# Patient Record
Sex: Male | Born: 1940
Health system: Southern US, Community
[De-identification: ages and names within clinical notes are randomized; demographics above are authoritative.]

## PROBLEM LIST (undated history)

## (undated) DIAGNOSIS — K6389 Other specified diseases of intestine: Secondary | ICD-10-CM

## (undated) DIAGNOSIS — M5410 Radiculopathy, site unspecified: Secondary | ICD-10-CM

## (undated) DIAGNOSIS — R972 Elevated prostate specific antigen [PSA]: Secondary | ICD-10-CM

## (undated) DIAGNOSIS — E782 Mixed hyperlipidemia: Secondary | ICD-10-CM

## (undated) DIAGNOSIS — I340 Nonrheumatic mitral (valve) insufficiency: Secondary | ICD-10-CM

## (undated) DIAGNOSIS — N529 Male erectile dysfunction, unspecified: Secondary | ICD-10-CM

## (undated) DIAGNOSIS — K5730 Diverticulosis of large intestine without perforation or abscess without bleeding: Secondary | ICD-10-CM

## (undated) DIAGNOSIS — R011 Cardiac murmur, unspecified: Secondary | ICD-10-CM

## (undated) DIAGNOSIS — N400 Enlarged prostate without lower urinary tract symptoms: Secondary | ICD-10-CM

## (undated) DIAGNOSIS — K449 Diaphragmatic hernia without obstruction or gangrene: Secondary | ICD-10-CM

## (undated) DIAGNOSIS — I350 Nonrheumatic aortic (valve) stenosis: Secondary | ICD-10-CM

## (undated) DIAGNOSIS — M109 Gout, unspecified: Secondary | ICD-10-CM

## (undated) DIAGNOSIS — I10 Essential (primary) hypertension: Secondary | ICD-10-CM

## (undated) DIAGNOSIS — I059 Rheumatic mitral valve disease, unspecified: Secondary | ICD-10-CM

## (undated) DIAGNOSIS — I517 Cardiomegaly: Secondary | ICD-10-CM

## (undated) DIAGNOSIS — E7800 Pure hypercholesterolemia, unspecified: Secondary | ICD-10-CM

## (undated) DIAGNOSIS — R195 Other fecal abnormalities: Secondary | ICD-10-CM

## (undated) DIAGNOSIS — I359 Nonrheumatic aortic valve disorder, unspecified: Secondary | ICD-10-CM

## (undated) DIAGNOSIS — I712 Thoracic aortic aneurysm, without rupture, unspecified: Secondary | ICD-10-CM

## (undated) DIAGNOSIS — G5620 Lesion of ulnar nerve, unspecified upper limb: Secondary | ICD-10-CM

## (undated) DIAGNOSIS — R030 Elevated blood-pressure reading, without diagnosis of hypertension: Secondary | ICD-10-CM

## (undated) DIAGNOSIS — D509 Iron deficiency anemia, unspecified: Secondary | ICD-10-CM

## (undated) DIAGNOSIS — M2550 Pain in unspecified joint: Secondary | ICD-10-CM

## (undated) DIAGNOSIS — G43109 Migraine with aura, not intractable, without status migrainosus: Secondary | ICD-10-CM

## (undated) HISTORY — DX: Diverticulosis of large intestine without perforation or abscess without bleeding: K57.30

## (undated) HISTORY — PX: HERNIA REPAIR: SHX51

## (undated) HISTORY — DX: Cardiomegaly: I51.7

## (undated) HISTORY — DX: Mixed hyperlipidemia: E78.2

## (undated) HISTORY — DX: Other fecal abnormalities: R19.5

## (undated) HISTORY — DX: Cardiac murmur, unspecified: R01.1

## (undated) HISTORY — DX: Essential (primary) hypertension: I10

## (undated) HISTORY — DX: Other specified diseases of intestine: K63.89

## (undated) HISTORY — DX: Lesion of ulnar nerve, unspecified upper limb: G56.20

## (undated) HISTORY — DX: Migraine with aura, not intractable, without status migrainosus: G43.109

## (undated) HISTORY — DX: Radiculopathy, site unspecified: M54.10

## (undated) HISTORY — DX: Elevated blood-pressure reading, without diagnosis of hypertension: R03.0

## (undated) HISTORY — DX: Benign prostatic hyperplasia without lower urinary tract symptoms: N40.0

## (undated) HISTORY — DX: Iron deficiency anemia, unspecified: D50.9

## (undated) HISTORY — DX: Thoracic aortic aneurysm, without rupture, unspecified: I71.20

## (undated) HISTORY — DX: Pain in unspecified joint: M25.50

## (undated) HISTORY — DX: Nonrheumatic aortic valve disorder, unspecified: I35.9

## (undated) HISTORY — DX: Elevated prostate specific antigen (PSA): R97.20

## (undated) HISTORY — DX: Gout, unspecified: M10.9

## (undated) HISTORY — DX: Nonrheumatic mitral (valve) insufficiency: I34.0

## (undated) HISTORY — DX: Pure hypercholesterolemia, unspecified: E78.00

## (undated) HISTORY — DX: Male erectile dysfunction, unspecified: N52.9

## (undated) HISTORY — DX: Diaphragmatic hernia without obstruction or gangrene: K44.9

## (undated) HISTORY — PX: TONSILLECTOMY AND ADENOIDECTOMY: SUR1326

---

## 2006-04-14 ENCOUNTER — Ambulatory Visit: Admission: RE | Admit: 2006-04-14 | Discharge: 2006-04-14 | Payer: Self-pay | Admitting: *Deleted

## 2010-07-27 HISTORY — PX: CATARACT EXTRACTION: SUR2

## 2012-07-07 DIAGNOSIS — H2510 Age-related nuclear cataract, unspecified eye: Secondary | ICD-10-CM | POA: Diagnosis not present

## 2012-07-27 HISTORY — PX: CATARACT EXTRACTION: SUR2

## 2012-08-03 DIAGNOSIS — H2510 Age-related nuclear cataract, unspecified eye: Secondary | ICD-10-CM | POA: Diagnosis not present

## 2012-08-08 DIAGNOSIS — H269 Unspecified cataract: Secondary | ICD-10-CM | POA: Diagnosis not present

## 2012-08-08 DIAGNOSIS — H2589 Other age-related cataract: Secondary | ICD-10-CM | POA: Diagnosis not present

## 2012-08-08 DIAGNOSIS — H2510 Age-related nuclear cataract, unspecified eye: Secondary | ICD-10-CM | POA: Diagnosis not present

## 2012-08-17 DIAGNOSIS — H2510 Age-related nuclear cataract, unspecified eye: Secondary | ICD-10-CM | POA: Diagnosis not present

## 2012-08-26 DIAGNOSIS — R030 Elevated blood-pressure reading, without diagnosis of hypertension: Secondary | ICD-10-CM | POA: Diagnosis not present

## 2012-08-26 DIAGNOSIS — M109 Gout, unspecified: Secondary | ICD-10-CM | POA: Diagnosis not present

## 2012-08-26 DIAGNOSIS — Z79899 Other long term (current) drug therapy: Secondary | ICD-10-CM | POA: Diagnosis not present

## 2012-09-01 DIAGNOSIS — D509 Iron deficiency anemia, unspecified: Secondary | ICD-10-CM | POA: Diagnosis not present

## 2012-09-01 DIAGNOSIS — I10 Essential (primary) hypertension: Secondary | ICD-10-CM | POA: Diagnosis not present

## 2012-09-01 DIAGNOSIS — M109 Gout, unspecified: Secondary | ICD-10-CM | POA: Diagnosis not present

## 2012-09-07 DIAGNOSIS — H269 Unspecified cataract: Secondary | ICD-10-CM | POA: Diagnosis not present

## 2012-09-07 DIAGNOSIS — H2510 Age-related nuclear cataract, unspecified eye: Secondary | ICD-10-CM | POA: Diagnosis not present

## 2012-09-07 DIAGNOSIS — H2589 Other age-related cataract: Secondary | ICD-10-CM | POA: Diagnosis not present

## 2012-09-21 DIAGNOSIS — Z1211 Encounter for screening for malignant neoplasm of colon: Secondary | ICD-10-CM | POA: Diagnosis not present

## 2012-09-29 DIAGNOSIS — I10 Essential (primary) hypertension: Secondary | ICD-10-CM | POA: Diagnosis not present

## 2012-09-29 DIAGNOSIS — D509 Iron deficiency anemia, unspecified: Secondary | ICD-10-CM | POA: Diagnosis not present

## 2012-09-29 DIAGNOSIS — Z79899 Other long term (current) drug therapy: Secondary | ICD-10-CM | POA: Diagnosis not present

## 2012-09-29 DIAGNOSIS — M109 Gout, unspecified: Secondary | ICD-10-CM | POA: Diagnosis not present

## 2012-10-07 DIAGNOSIS — D509 Iron deficiency anemia, unspecified: Secondary | ICD-10-CM | POA: Diagnosis not present

## 2012-10-07 DIAGNOSIS — R195 Other fecal abnormalities: Secondary | ICD-10-CM | POA: Diagnosis not present

## 2012-11-16 DIAGNOSIS — K319 Disease of stomach and duodenum, unspecified: Secondary | ICD-10-CM | POA: Diagnosis not present

## 2012-11-16 DIAGNOSIS — K5730 Diverticulosis of large intestine without perforation or abscess without bleeding: Secondary | ICD-10-CM | POA: Diagnosis not present

## 2012-11-16 DIAGNOSIS — K449 Diaphragmatic hernia without obstruction or gangrene: Secondary | ICD-10-CM | POA: Diagnosis not present

## 2012-11-16 DIAGNOSIS — D126 Benign neoplasm of colon, unspecified: Secondary | ICD-10-CM | POA: Diagnosis not present

## 2012-11-16 DIAGNOSIS — D509 Iron deficiency anemia, unspecified: Secondary | ICD-10-CM | POA: Diagnosis not present

## 2012-11-16 DIAGNOSIS — K2970 Gastritis, unspecified, without bleeding: Secondary | ICD-10-CM | POA: Diagnosis not present

## 2012-11-16 DIAGNOSIS — D490 Neoplasm of unspecified behavior of digestive system: Secondary | ICD-10-CM | POA: Diagnosis not present

## 2012-11-16 DIAGNOSIS — R195 Other fecal abnormalities: Secondary | ICD-10-CM | POA: Diagnosis not present

## 2012-11-28 ENCOUNTER — Ambulatory Visit (INDEPENDENT_AMBULATORY_CARE_PROVIDER_SITE_OTHER): Payer: Medicare Other | Admitting: Surgery

## 2012-11-28 ENCOUNTER — Encounter (INDEPENDENT_AMBULATORY_CARE_PROVIDER_SITE_OTHER): Payer: Self-pay | Admitting: Surgery

## 2012-11-28 VITALS — BP 176/84 | HR 96 | Temp 98.9°F | Resp 20 | Ht 70.0 in | Wt 181.6 lb

## 2012-11-28 DIAGNOSIS — D126 Benign neoplasm of colon, unspecified: Secondary | ICD-10-CM

## 2012-11-28 DIAGNOSIS — M109 Gout, unspecified: Secondary | ICD-10-CM

## 2012-11-28 DIAGNOSIS — D509 Iron deficiency anemia, unspecified: Secondary | ICD-10-CM | POA: Insufficient documentation

## 2012-11-28 DIAGNOSIS — K449 Diaphragmatic hernia without obstruction or gangrene: Secondary | ICD-10-CM

## 2012-11-28 DIAGNOSIS — K5730 Diverticulosis of large intestine without perforation or abscess without bleeding: Secondary | ICD-10-CM

## 2012-11-28 HISTORY — DX: Diverticulosis of large intestine without perforation or abscess without bleeding: K57.30

## 2012-11-28 HISTORY — DX: Diaphragmatic hernia without obstruction or gangrene: K44.9

## 2012-11-28 HISTORY — DX: Iron deficiency anemia, unspecified: D50.9

## 2012-11-28 MED ORDER — METRONIDAZOLE 500 MG PO TABS: 500.0000 mg | ORAL_TABLET | ORAL | Status: DC

## 2012-11-28 MED ORDER — DEXAMETHASONE SODIUM PHOSPHATE 4 MG/ML IJ SOLN: 4.0000 mg | INTRAMUSCULAR | Status: DC

## 2012-11-28 MED ORDER — NEOMYCIN SULFATE 500 MG PO TABS: 1000.0000 mg | ORAL_TABLET | ORAL | Status: DC

## 2012-11-28 NOTE — Patient Instructions (Addendum)
COLON PREP INSTRUCTIONS for lower/distal colectomy:   Obtain what you need at a pharmacy of your choice:      Prescriptions for your oral antibiotics (Neomycin & Metronidazole)     A bottle of Milk of Magnesia    2 Fleet enemas (generic form OK to use)   DAY PRIOR TO SURGERY:    1:00pm    o Take 2 oz (4 tablespoons) Milk of Magnesia. o Take 2 Neomycin 500mg  tablets & 2 Metronidazole 500mg  tablets     3:00pm:    o Take 2 Neomycin 500mg  tablets & 2 Metronidazole 500mg  tablets     Bedtime (~10:00pm)  o Take 2 Neomycin 500mg  tablets & 2 Metronidazole 500mg  tablets    Midnight:  Do not eat or drink anything after midnight the night before your surgery.   MORNING OF PROCEDURE:    Remember to not to drink or eat anything that morning    Upon waking up, take the 2 Fleet enemas.  o Use at least 1 hour before leaving house o Try to retain each enema for 5-10 minutes before expelling it.  This should clean your lower colon sufficiently   If you have questions or problems, please call [facility] [phone] speak to someone in the clinic department at our office    Colon Polyps Polyps are lumps of extra tissue growing inside the body. Polyps can grow in the large intestine (colon). Most colon polyps are noncancerous (benign). However, some colon polyps can become cancerous over time. Polyps that are larger than a pea may be harmful. To be safe, caregivers remove and test all polyps. CAUSES  Polyps form when mutations in the genes cause your cells to grow and divide even though no more tissue is needed. RISK FACTORS There are a number of risk factors that can increase your chances of getting colon polyps. They include:  Being older than 50 years.  Family history of colon polyps or colon cancer.  Long-term colon diseases, such as colitis or Crohn disease.  Being overweight.  Smoking.  Being inactive.  Drinking too much alcohol. SYMPTOMS  Most small  polyps do not cause symptoms. If symptoms are present, they may include:  Blood in the stool. The stool may look dark red or black.  Constipation or diarrhea that lasts longer than 1 week. DIAGNOSIS People often do not know they have polyps until their caregiver finds them during a regular checkup. Your caregiver can use 4 tests to check for polyps:  Digital rectal exam. The caregiver wears gloves and feels inside the rectum. This test would find polyps only in the rectum.  Barium enema. The caregiver puts a liquid called barium into your rectum before taking X-rays of your colon. Barium makes your colon look white. Polyps are dark, so they are easy to see in the X-ray pictures.  Sigmoidoscopy. A thin, flexible tube (sigmoidoscope) is placed into your rectum. The sigmoidoscope has a light and tiny camera in it. The caregiver uses the sigmoidoscope to look at the last third of your colon.  Colonoscopy. This test is like sigmoidoscopy, but the caregiver looks at the entire colon. This is the most common method for finding and removing polyps. TREATMENT  Any polyps will be removed during a sigmoidoscopy or colonoscopy. The polyps are then tested for cancer. PREVENTION  To help lower your risk of getting more colon polyps:  Eat plenty of fruits and vegetables. Avoid eating fatty foods.  Do not smoke.  Avoid drinking  alcohol.  Exercise every day.  Lose weight if recommended by your caregiver.  Eat plenty of calcium and folate. Foods that are rich in calcium include milk, cheese, and broccoli. Foods that are rich in folate include chickpeas, kidney beans, and spinach. HOME CARE INSTRUCTIONS Keep all follow-up appointments as directed by your caregiver. You may need periodic exams to check for polyps. SEEK MEDICAL CARE IF: You notice bleeding during a bowel movement. Document Released: 04/08/2004 Document Revised: 10/05/2011 Document Reviewed: 09/22/2011 [facility] Patient Information  2013 [place], [place].  ABDOMINAL SURGERY: POST OP INSTRUCTIONS  1. DIET: Follow a light bland diet the first 24 hours after arrival home, such as soup, liquids, crackers, etc.  Be sure to include lots of fluids daily.  Avoid fast food or heavy meals as your are more likely to get nauseated.  Eat a low fat the next few days after surgery.   2. Take your usually prescribed home medications unless otherwise directed. 3. PAIN CONTROL: a. Pain is best controlled by a usual combination of three different methods TOGETHER: i. Ice/Heat ii. Over the counter pain medication iii. Prescription pain medication b. Most patients will experience some swelling and bruising around the incisions.  Ice packs or heating pads (30-60 minutes up to 6 times a day) will help. Use ice for the first few days to help decrease swelling and bruising, then switch to heat to help relax tight/sore spots and speed recovery.  Some people prefer to use ice alone, heat alone, alternating between ice & heat.  Experiment to what works for you.  Swelling and bruising can take several weeks to resolve.   c. It is helpful to take an over-the-counter pain medication regularly for the first few weeks.  Choose one of the following that works best for you: i. Naproxen (Aleve, etc)  Two 220mg  tabs twice a day ii. Ibuprofen (Advil, etc) Three 200mg  tabs four times a day (every meal & bedtime) iii. Acetaminophen (Tylenol, etc) 500-650mg  four times a day (every meal & bedtime) d. A  prescription for pain medication (such as oxycodone, hydrocodone, etc) should be given to you upon discharge.  Take your pain medication as prescribed.  i. If you are having problems/concerns with the prescription medicine (does not control pain, nausea, vomiting, rash, itching, etc), please call us [phone] to see if we need to switch you to a different pain medicine that will work better for you and/or control your side effect better. ii. If you need a refill on  your pain medication, please contact your pharmacy.  They will contact our office to request authorization. Prescriptions will not be filled after 5 pm or on week-ends. 4. Avoid getting constipated.  Between the surgery and the pain medications, it is common to experience some constipation.  Increasing fluid intake and taking a fiber supplement (such as Metamucil, Citrucel, FiberCon, MiraLax, etc) 1-2 times a day regularly will usually help prevent this problem from occurring.  A mild laxative (prune juice, Milk of Magnesia, MiraLax, etc) should be taken according to package directions if there are no bowel movements after 48 hours.   5. Watch out for diarrhea.  If you have many loose bowel movements, simplify your diet to bland foods & liquids for a few days.  Stop any stool softeners and decrease your fiber supplement.  Switching to mild anti-diarrheal medications (Kayopectate, Pepto Bismol) can help.  If this worsens or does not improve, please call us. 6. Wash / shower every day.  You  may shower over the incision / wound.  Avoid baths until the skin is fully healed.  Continue to shower over incision(s) after the dressing is off. 7. Remove your waterproof bandages 5 days after surgery.  You may leave the incision open to air.  You may replace a dressing/Band-Aid to cover the incision for comfort if you wish. 8. ACTIVITIES as tolerated:   a. You may resume regular (light) daily activities beginning the next day-such as daily self-care, walking, climbing stairs-gradually increasing activities as tolerated.  If you can walk 30 minutes without difficulty, it is safe to try more intense activity such as jogging, treadmill, bicycling, low-impact aerobics, swimming, etc. b. Save the most intensive and strenuous activity for last such as sit-ups, heavy lifting, contact sports, etc  Refrain from any heavy lifting or straining until you are off narcotics for pain control.   c. DO NOT PUSH THROUGH PAIN.  Let pain be  your guide: If it hurts to do something, don't do it.  Pain is your body warning you to avoid that activity for another week until the pain goes down. d. You may drive when you are no longer taking prescription pain medication, you can comfortably wear a seatbelt, and you can safely maneuver your car and apply brakes. e. Bonita Quin may have sexual intercourse when it is comfortable.  9. FOLLOW UP in our office a. Please call [facility] at [phone] to set up an appointment to see your surgeon in the office for a follow-up appointment approximately 1-2 weeks after your surgery. b. Make sure that you call for this appointment the day you arrive home to insure a convenient appointment time. 10. IF YOU HAVE DISABILITY OR FAMILY LEAVE FORMS, BRING THEM TO THE OFFICE FOR PROCESSING.  DO NOT GIVE THEM TO YOUR DOCTOR.   WHEN TO CALL us [phone]: 1. Poor pain control 2. Reactions / problems with new medications (rash/itching, nausea, etc)  3. Fever over 101.5 F (38.5 C) 4. Inability to urinate 5. Nausea and/or vomiting 6. Worsening swelling or bruising 7. Continued bleeding from incision. 8. Increased pain, redness, or drainage from the incision  The clinic staff is available to answer your questions during regular business hours (8:30am-5pm).  Please don't hesitate to call and ask to speak to one of our nurses for clinical concerns.   A surgeon from [facility] is always on call at the hospitals   If you have a medical emergency, go to the nearest emergency room or call 911.    [facility] [address], [place], [place]  [address] ? MAIN: [phone] ? TOLL FREE: [phone] ? FAX [phone] [email or web address]

## 2012-11-28 NOTE — Progress Notes (Signed)
Subjective:     Patient ID: Drew Adams, male   DOB: 02/11/1941, 71 y.o.   MRN: [id number]  HPI  Drew Adams  03/14/1941 [id number]  Patient Care Team: Robert Gates, MD as PCP - General (Internal Medicine) John C Hayes, MD as Consulting Physician (Gastroenterology)  This patient is a 71 y.o.male who presents today for surgical evaluation at the request of Dr Hayes.   Reason for visit: 4 cm polyp in sigmoid colon.  Likely source of iron deficiency anemia.  Pleasant elderly gentleman.  Moderately severe gout.  On intermittent prednisone.  Takes a couple doses a week.  Used to walk more regularly but now is limited to about 15 minutes secondary to joint pain.  No cardiopulmonary issues.   No exertional chest/neck/shoulder/arm pain.   Has had worsening anemia over the past year or so.  Underwent screening colonoscopy for heme positive stools.  Found to have large polyp in sigmoid colon by Dr Hayes.  4 x 2 cm.  He did not feel it was safe to remove endoscopically.  He was concerned about possible early cancer.  Area not tattooed.  Surgical consultation requested.  Patient normally has a bowel movement every day.  No personal nor family history of GI/colon cancer, inflammatory bowel disease, irritable bowel syndrome, allergy such as Celiac Sprue, dietary/dairy problems, colitis, ulcers nor gastritis.  No recent sick contacts/gastroenteritis.  No travel outside the country.  No changes in diet.    Patient Active Problem List   Diagnosis Date Noted  . Adenomatous polyp of sigmoid colon 11/28/2012  . Iron deficiency anemia 11/28/2012  . Paraesophageal hiatal hernia 11/28/2012  . Diverticulosis of colon without hemorrhage 11/28/2012  . Gout     Past Medical History  Diagnosis Date  . Gout     uses prednisone 1-2x/week  . Iron deficiency anemia 11/28/2012  . Paraesophageal hiatal hernia 11/28/2012  . Diverticulosis of colon without hemorrhage 11/28/2012    Past Surgical History  Procedure  Laterality Date  . Hernia repair    . Tonsillectomy and adenoidectomy      History   Social History  . Marital Status: Married    Spouse Name: N/A    Number of Children: N/A  . Years of Education: N/A   Occupational History  . Not on file.   Social History Main Topics  . Smoking status: Never Smoker   . Smokeless tobacco: Never Used  . Alcohol Use: No  . Drug Use: No  . Sexually Active: Not on file   Other Topics Concern  . Not on file   Social History Narrative  . No narrative on file    Family History  Problem Relation Age of Onset  . Parkinson's disease Father   . Diabetes Father     Current Outpatient Prescriptions  Medication Sig Dispense Refill  . allopurinol (ZYLOPRIM) 300 MG tablet       . calcium citrate (CALCITRATE - DOSED IN MG ELEMENTAL CALCIUM) 950 MG tablet Take 1 tablet by mouth daily.      . celecoxib (CELEBREX) 200 MG capsule Take 200 mg by mouth 2 (two) times daily.      . Cholecalciferol (VITAMIN D-3) 5000 UNITS TABS Take by mouth.      . Cholecalciferol (VITAMIN D3) 10000 UNITS capsule Take 10,000 Units by mouth daily.      . COLCRYS 0.6 MG tablet       . glucosamine-chondroitin 500-400 MG tablet Take 1 tablet by mouth 3 (three) times   daily.      . magnesium oxide (MAG-OX) 400 MG tablet Take 400 mg by mouth daily.      . OVER THE COUNTER MEDICATION Trunature [facility]      . polyethylene glycol-electrolytes (NULYTELY/GOLYTELY) 420 G solution       . predniSONE (DELTASONE) 5 MG tablet       . metroNIDAZOLE (FLAGYL) 500 MG tablet Take 1 tablet (500 mg total) by mouth as directed. Take 2 pills (=1000mg) by mouth at 1pm, 3pm, and 10pm the day before your colorectal operation as discussed in [facility] office.  Call [phone] with questions  6 tablet  0  . neomycin (MYCIFRADIN) 500 MG tablet Take 2 tablets (1,000 mg total) by mouth as directed. Take 2 pills (=1000mg) by mouth at 1pm, 3pm, and 10pm the day before your colorectal operation as  discussed in [facility] office.  Call [phone] with questions  6 tablet  0   No current facility-administered medications for this visit.     No Known Allergies  BP 176/84  Pulse 96  Temp(Src) 98.9 F (37.2 C) (Oral)  Resp 20  Ht 5' 10" (1.778 m)  Wt 181 lb 9.6 oz (82.373 kg)  BMI 26.06 kg/m2  No results found.   Review of Systems  Constitutional: Negative for fever, chills, diaphoresis, activity change, fatigue and unexpected weight change.  HENT: Negative for nosebleeds, sore throat, facial swelling, mouth sores, trouble swallowing and ear discharge.   Eyes: Negative for photophobia, discharge and visual disturbance.  Respiratory: Negative for choking, chest tightness, shortness of breath and stridor.   Cardiovascular: Negative for chest pain and palpitations.  Gastrointestinal: Negative for nausea, vomiting, abdominal pain, diarrhea, constipation, abdominal distention, anal bleeding and rectal pain.  Endocrine: Negative for cold intolerance and heat intolerance.  Genitourinary: Negative for dysuria, urgency, difficulty urinating and testicular pain.  Musculoskeletal: Positive for joint swelling and arthralgias. Negative for myalgias, back pain and gait problem.  Skin: Negative for color change, pallor, rash and wound.  Allergic/Immunologic: Negative for environmental allergies and food allergies.  Neurological: Negative for dizziness, speech difficulty, weakness, numbness and headaches.  Hematological: Negative for adenopathy. Does not bruise/bleed easily.  Psychiatric/Behavioral: Negative for hallucinations, confusion and agitation.       Objective:   Physical Exam  Constitutional: He is oriented to person, place, and time. He appears well-developed and well-nourished. No distress.  HENT:  Head: Normocephalic.  Mouth/Throat: Oropharynx is clear and moist. No oropharyngeal exudate.  Eyes: Conjunctivae and EOM are normal. Pupils are equal, round, and reactive to light. No  scleral icterus.  Neck: Normal range of motion. Neck supple. No tracheal deviation present.  Cardiovascular: Normal rate, regular rhythm and intact distal pulses.   Pulmonary/Chest: Effort normal and breath sounds normal. No respiratory distress.  Abdominal: Soft. He exhibits no distension. There is no tenderness. There is no rigidity, no guarding, no CVA tenderness, no tenderness at McBurney's point and negative Murphy's sign. No hernia. Hernia confirmed negative in the right inguinal area and confirmed negative in the left inguinal area.    Obese but soft.  Genitourinary:  ? small impulse in left groin but no definite inguinal hernia   Musculoskeletal: Normal range of motion. He exhibits no tenderness.  Lymphadenopathy:    He has no cervical adenopathy.       Right: No inguinal adenopathy present.       Left: No inguinal adenopathy present.  Neurological: He is alert and oriented to person, place, and time.   No cranial nerve deficit. He exhibits normal muscle tone. Coordination normal.  Skin: Skin is warm and dry. No rash noted. He is not diaphoretic. No erythema. No pallor.  Psychiatric: He has a normal mood and affect. His behavior is normal. Judgment and thought content normal.       Assessment:     4 cm polyp in sigmoid colon, unresectable by endoscopy.     Plan:     Given the fact is the source of anemia of moderate size and not removable endoscopically, I recommended segmental resection of the colon containing the mass, Probable sigmoidectomy.  Reasonable laparoscopic candidate.Will try to make sure that it is not in the rectum by rigid proctoscopy on the table.  We will hold off on tattooing if possible since of moderate size.  The anatomy & physiology of the digestive tract was discussed.  The pathophysiology was discussed.  Natural history risks without surgery was discussed.   I feel the risks of no intervention will lead to serious problems that outweigh the operative risks;  therefore, I recommended a partial colectomy to remove the pathology.  Laparoscopic & open techniques were discussed.   Risks such as bleeding, infection, abscess, leak, reoperation, possible ostomy, hernia, heart attack, death, and other risks were discussed.  I noted a good likelihood this will help address the problem.   Goals of post-operative recovery were discussed as well.  We will work to minimize complications.  An educational handout on the pathology was given as well.  Questions were answered.  The patient expresses understanding & wishes to proceed with surgery.       

## 2012-12-02 DIAGNOSIS — K6389 Other specified diseases of intestine: Secondary | ICD-10-CM | POA: Diagnosis not present

## 2012-12-02 DIAGNOSIS — M109 Gout, unspecified: Secondary | ICD-10-CM | POA: Diagnosis not present

## 2012-12-02 DIAGNOSIS — I10 Essential (primary) hypertension: Secondary | ICD-10-CM | POA: Diagnosis not present

## 2012-12-02 DIAGNOSIS — D509 Iron deficiency anemia, unspecified: Secondary | ICD-10-CM | POA: Diagnosis not present

## 2012-12-13 ENCOUNTER — Encounter (INDEPENDENT_AMBULATORY_CARE_PROVIDER_SITE_OTHER): Payer: Self-pay

## 2012-12-13 ENCOUNTER — Encounter (HOSPITAL_COMMUNITY): Payer: Self-pay | Admitting: Pharmacy Technician

## 2012-12-20 DIAGNOSIS — M109 Gout, unspecified: Secondary | ICD-10-CM | POA: Diagnosis not present

## 2012-12-20 DIAGNOSIS — K6389 Other specified diseases of intestine: Secondary | ICD-10-CM | POA: Diagnosis not present

## 2012-12-20 DIAGNOSIS — I10 Essential (primary) hypertension: Secondary | ICD-10-CM | POA: Diagnosis not present

## 2012-12-20 DIAGNOSIS — D509 Iron deficiency anemia, unspecified: Secondary | ICD-10-CM | POA: Diagnosis not present

## 2012-12-20 NOTE — Patient Instructions (Addendum)
Drew Adams  12/20/2012   Your procedure is scheduled on:  12/27/12   Report to [facility] at   0500  AM.  Call this number if you have problems the morning of surgery: [phone]   Remember:   Do not eat food or drink liquids after midnight.   Take these medicines the morning of surgery with A SIP OF WATER:    Do not wear jewelry,   Do not wear lotions, powders, or perfumes.    Men may shave face and neck.  Do not bring valuables to the hospital.  Contacts, dentures or bridgework may not be worn into surgery.  Leave suitcase in the car. After surgery it may be brought to your room.  For patients admitted to the hospital, checkout time is 11:00 AM the day of  discharge.    SEE CHG INSTRUCTION SHEET    Please read over the following fact sheets that you were given: MRSA Information, coughing and deep breathing exercises, leg exercises, Blood Transfusion Fact Sheet                Failure to comply with these instructions may result in cancellation of your surgery.                Patient Signature ____________________________              Nurse Signature _____________________________

## 2012-12-21 ENCOUNTER — Encounter (HOSPITAL_COMMUNITY): Payer: Self-pay

## 2012-12-21 ENCOUNTER — Encounter (HOSPITAL_COMMUNITY)
Admission: RE | Admit: 2012-12-21 | Discharge: 2012-12-21 | Disposition: A | Discharge status: Home / Self Care | Payer: Medicare Other | Source: Ambulatory Visit | Attending: Surgery | Admitting: Surgery

## 2012-12-21 ENCOUNTER — Other Ambulatory Visit: Payer: Self-pay

## 2012-12-21 DIAGNOSIS — N182 Chronic kidney disease, stage 2 (mild): Secondary | ICD-10-CM | POA: Diagnosis not present

## 2012-12-21 DIAGNOSIS — M109 Gout, unspecified: Secondary | ICD-10-CM | POA: Diagnosis present

## 2012-12-21 DIAGNOSIS — D126 Benign neoplasm of colon, unspecified: Secondary | ICD-10-CM | POA: Diagnosis not present

## 2012-12-21 DIAGNOSIS — D649 Anemia, unspecified: Secondary | ICD-10-CM | POA: Diagnosis not present

## 2012-12-21 DIAGNOSIS — D509 Iron deficiency anemia, unspecified: Secondary | ICD-10-CM | POA: Diagnosis not present

## 2012-12-21 DIAGNOSIS — Z01812 Encounter for preprocedural laboratory examination: Secondary | ICD-10-CM | POA: Diagnosis not present

## 2012-12-21 DIAGNOSIS — K5730 Diverticulosis of large intestine without perforation or abscess without bleeding: Secondary | ICD-10-CM | POA: Diagnosis not present

## 2012-12-21 LAB — BASIC METABOLIC PANEL
CO2: 28 mEq/L (ref 19–32)
Chloride: 102 mEq/L (ref 96–112)
Glucose, Bld: 94 mg/dL (ref 70–99)
Sodium: 141 mEq/L (ref 135–145)

## 2012-12-21 LAB — CBC
Hemoglobin: 12.4 g/dL — ABNORMAL LOW (ref 13.0–17.0)
MCV: 80.3 fL (ref 78.0–100.0)
Platelets: 145 10*3/uL — ABNORMAL LOW (ref 150–400)
RBC: 4.97 MIL/uL (ref 4.22–5.81)
WBC: 10.7 10*3/uL — ABNORMAL HIGH (ref 4.0–10.5)

## 2012-12-21 LAB — ABO/RH: ABO/RH(D): A POS

## 2012-12-21 NOTE — Progress Notes (Signed)
Office visit note related to elevated blood pressure on 12/20/12 on chart from PCP.

## 2012-12-21 NOTE — Progress Notes (Signed)
CBC and BMP results faxed via EPIC to Dr Michaell Cowing.

## 2012-12-26 MED ORDER — SODIUM CHLORIDE 0.9 % IV SOLN
INTRAVENOUS | Status: DC
  Filled 2012-12-26: qty 6

## 2012-12-27 ENCOUNTER — Inpatient Hospital Stay (HOSPITAL_COMMUNITY): Payer: Medicare Other | Admitting: Registered Nurse

## 2012-12-27 ENCOUNTER — Encounter (HOSPITAL_COMMUNITY)
Admission: RE | Disposition: A | Discharge status: Home / Self Care | Payer: Self-pay | Source: Ambulatory Visit | Attending: Surgery

## 2012-12-27 ENCOUNTER — Encounter (HOSPITAL_COMMUNITY): Payer: Self-pay | Admitting: Registered Nurse

## 2012-12-27 ENCOUNTER — Encounter (HOSPITAL_COMMUNITY): Payer: Self-pay | Admitting: *Deleted

## 2012-12-27 ENCOUNTER — Inpatient Hospital Stay (HOSPITAL_COMMUNITY)
Admission: RE | Admit: 2012-12-27 | Discharge: 2012-12-30 | DRG: 331 | Disposition: A | Discharge status: Home / Self Care | Payer: Medicare Other | Source: Ambulatory Visit | Attending: Surgery | Admitting: Surgery

## 2012-12-27 DIAGNOSIS — D126 Benign neoplasm of colon, unspecified: Secondary | ICD-10-CM

## 2012-12-27 DIAGNOSIS — D509 Iron deficiency anemia, unspecified: Secondary | ICD-10-CM | POA: Diagnosis present

## 2012-12-27 DIAGNOSIS — N182 Chronic kidney disease, stage 2 (mild): Secondary | ICD-10-CM

## 2012-12-27 DIAGNOSIS — M109 Gout, unspecified: Secondary | ICD-10-CM | POA: Diagnosis present

## 2012-12-27 DIAGNOSIS — Z01812 Encounter for preprocedural laboratory examination: Secondary | ICD-10-CM

## 2012-12-27 DIAGNOSIS — K5730 Diverticulosis of large intestine without perforation or abscess without bleeding: Secondary | ICD-10-CM | POA: Diagnosis present

## 2012-12-27 HISTORY — PX: LAPAROSCOPIC SIGMOID COLECTOMY: SHX5928

## 2012-12-27 HISTORY — PX: LAPAROSCOPIC PARTIAL COLECTOMY: SHX5907

## 2012-12-27 LAB — TYPE AND SCREEN
ABO/RH(D): A POS
Antibody Screen: NEGATIVE

## 2012-12-27 SURGERY — LAPAROSCOPIC PARTIAL COLECTOMY
Anesthesia: General | Site: Abdomen | Wound class: Contaminated

## 2012-12-27 MED ORDER — HEPARIN SODIUM (PORCINE) 5000 UNIT/ML IJ SOLN
5000.0000 [IU] | Freq: Three times a day (TID) | INTRAMUSCULAR | Status: DC
  Administered 2012-12-28 – 2012-12-30 (×7): 5000 [IU] via SUBCUTANEOUS
  Filled 2012-12-27 (×10): qty 1

## 2012-12-27 MED ORDER — ONDANSETRON HCL 4 MG/2ML IJ SOLN
INTRAMUSCULAR | Status: DC | PRN
  Administered 2012-12-27: 4 mg via INTRAVENOUS

## 2012-12-27 MED ORDER — OXYCODONE HCL 5 MG PO TABS: 5.0000 mg | ORAL_TABLET | ORAL | Status: DC | PRN

## 2012-12-27 MED ORDER — SACCHAROMYCES BOULARDII 250 MG PO CAPS
250.0000 mg | ORAL_CAPSULE | Freq: Two times a day (BID) | ORAL | Status: DC
  Administered 2012-12-27 – 2012-12-30 (×7): 250 mg via ORAL
  Filled 2012-12-27 (×8): qty 1

## 2012-12-27 MED ORDER — DIPHENHYDRAMINE HCL 50 MG/ML IJ SOLN: 12.5000 mg | Freq: Four times a day (QID) | INTRAMUSCULAR | Status: DC | PRN

## 2012-12-27 MED ORDER — ALLOPURINOL 300 MG PO TABS
300.0000 mg | ORAL_TABLET | Freq: Every morning | ORAL | Status: DC
  Administered 2012-12-27 – 2012-12-30 (×4): 300 mg via ORAL
  Filled 2012-12-27 (×4): qty 1

## 2012-12-27 MED ORDER — PHENYLEPHRINE HCL 10 MG/ML IJ SOLN
INTRAMUSCULAR | Status: DC | PRN
  Administered 2012-12-27: 120 ug via INTRAVENOUS
  Administered 2012-12-27: 80 ug via INTRAVENOUS
  Administered 2012-12-27: 120 ug via INTRAVENOUS

## 2012-12-27 MED ORDER — SODIUM CHLORIDE 0.9 % IV SOLN: 250.0000 mL | INTRAVENOUS | Status: DC | PRN

## 2012-12-27 MED ORDER — HEPARIN SODIUM (PORCINE) 5000 UNIT/ML IJ SOLN
5000.0000 [IU] | Freq: Once | INTRAMUSCULAR | Status: AC
  Administered 2012-12-27: 5000 [IU] via SUBCUTANEOUS
  Filled 2012-12-27: qty 1

## 2012-12-27 MED ORDER — DEXTROSE 5 % IV SOLN
2.0000 g | Freq: Two times a day (BID) | INTRAVENOUS | Status: AC
  Administered 2012-12-27: 2 g via INTRAVENOUS
  Filled 2012-12-27: qty 2

## 2012-12-27 MED ORDER — SODIUM CHLORIDE 0.9 % IJ SOLN
INTRAMUSCULAR | Status: DC | PRN
  Administered 2012-12-27: 08:00:00

## 2012-12-27 MED ORDER — SODIUM CHLORIDE 0.9 % IJ SOLN: 3.0000 mL | INTRAMUSCULAR | Status: DC | PRN

## 2012-12-27 MED ORDER — ROCURONIUM BROMIDE 100 MG/10ML IV SOLN
INTRAVENOUS | Status: DC | PRN
  Administered 2012-12-27 (×2): 10 mg via INTRAVENOUS
  Administered 2012-12-27: 50 mg via INTRAVENOUS

## 2012-12-27 MED ORDER — STERILE WATER FOR IRRIGATION IR SOLN
Status: DC | PRN
  Administered 2012-12-27: 10:00:00 via INTRAVESICAL

## 2012-12-27 MED ORDER — LACTATED RINGERS IV BOLUS (SEPSIS): 1000.0000 mL | Freq: Three times a day (TID) | INTRAVENOUS | Status: AC | PRN

## 2012-12-27 MED ORDER — ALVIMOPAN 12 MG PO CAPS
12.0000 mg | ORAL_CAPSULE | Freq: Two times a day (BID) | ORAL | Status: DC
  Administered 2012-12-28 (×2): 12 mg via ORAL
  Filled 2012-12-27 (×4): qty 1

## 2012-12-27 MED ORDER — LIDOCAINE HCL (CARDIAC) 20 MG/ML IV SOLN
INTRAVENOUS | Status: DC | PRN
  Administered 2012-12-27: 100 mg via INTRAVENOUS

## 2012-12-27 MED ORDER — MIDAZOLAM HCL 5 MG/5ML IJ SOLN
INTRAMUSCULAR | Status: DC | PRN
  Administered 2012-12-27: 2 mg via INTRAVENOUS

## 2012-12-27 MED ORDER — PREDNISONE 1 MG PO TABS
1.0000 mg | ORAL_TABLET | Freq: Two times a day (BID) | ORAL | Status: DC
  Filled 2012-12-27 (×2): qty 1

## 2012-12-27 MED ORDER — PNEUMOCOCCAL VAC POLYVALENT 25 MCG/0.5ML IJ INJ
0.5000 mL | INJECTION | INTRAMUSCULAR | Status: AC
  Filled 2012-12-27 (×3): qty 0.5

## 2012-12-27 MED ORDER — PROMETHAZINE HCL 25 MG/ML IJ SOLN: 6.2500 mg | INTRAMUSCULAR | Status: DC | PRN

## 2012-12-27 MED ORDER — KCL IN DEXTROSE-NACL 40-5-0.9 MEQ/L-%-% IV SOLN
INTRAVENOUS | Status: DC
  Administered 2012-12-27: 14:00:00 via INTRAVENOUS
  Filled 2012-12-27 (×2): qty 1000

## 2012-12-27 MED ORDER — NEOSTIGMINE METHYLSULFATE 1 MG/ML IJ SOLN
INTRAMUSCULAR | Status: DC | PRN
  Administered 2012-12-27: 4 mg via INTRAVENOUS

## 2012-12-27 MED ORDER — ACETAMINOPHEN 500 MG PO TABS
1000.0000 mg | ORAL_TABLET | Freq: Three times a day (TID) | ORAL | Status: DC
  Administered 2012-12-27 – 2012-12-30 (×9): 1000 mg via ORAL
  Filled 2012-12-27 (×11): qty 2

## 2012-12-27 MED ORDER — DEXAMETHASONE SODIUM PHOSPHATE 4 MG/ML IJ SOLN
4.0000 mg | INTRAMUSCULAR | Status: AC
  Administered 2012-12-27: 8 mg via INTRAVENOUS
  Filled 2012-12-27: qty 1

## 2012-12-27 MED ORDER — HYDROMORPHONE HCL PF 1 MG/ML IJ SOLN
0.5000 mg | INTRAMUSCULAR | Status: DC | PRN
  Administered 2012-12-27 (×2): 2 mg via INTRAVENOUS
  Administered 2012-12-27 – 2012-12-29 (×4): 1 mg via INTRAVENOUS
  Filled 2012-12-27 (×3): qty 2
  Filled 2012-12-27 (×3): qty 1

## 2012-12-27 MED ORDER — LIP MEDEX EX OINT
1.0000 "application " | TOPICAL_OINTMENT | Freq: Two times a day (BID) | CUTANEOUS | Status: DC
  Administered 2012-12-27 – 2012-12-30 (×5): 1 via TOPICAL
  Filled 2012-12-27 (×2): qty 7

## 2012-12-27 MED ORDER — LACTATED RINGERS IV SOLN
INTRAVENOUS | Status: DC | PRN
  Administered 2012-12-27 (×4): via INTRAVENOUS

## 2012-12-27 MED ORDER — MAGIC MOUTHWASH
15.0000 mL | Freq: Four times a day (QID) | ORAL | Status: DC | PRN
  Filled 2012-12-27: qty 15

## 2012-12-27 MED ORDER — SUCCINYLCHOLINE CHLORIDE 20 MG/ML IJ SOLN
INTRAMUSCULAR | Status: DC | PRN
  Administered 2012-12-27: 100 mg via INTRAVENOUS

## 2012-12-27 MED ORDER — PROMETHAZINE HCL 25 MG/ML IJ SOLN: 6.2500 mg | Freq: Four times a day (QID) | INTRAMUSCULAR | Status: DC | PRN

## 2012-12-27 MED ORDER — ZINC SULFATE 220 (50 ZN) MG PO CAPS
220.0000 mg | ORAL_CAPSULE | Freq: Every day | ORAL | Status: DC
  Administered 2012-12-27 – 2012-12-30 (×4): 220 mg via ORAL
  Filled 2012-12-27 (×4): qty 1

## 2012-12-27 MED ORDER — BUPIVACAINE 0.25 % ON-Q PUMP DUAL CATH 300 ML
300.0000 mL | INJECTION | Status: DC
  Filled 2012-12-27: qty 300

## 2012-12-27 MED ORDER — ZINC GLUCONATE 50 MG PO TABS: 30.0000 mg | ORAL_TABLET | Freq: Every day | ORAL | Status: DC

## 2012-12-27 MED ORDER — ADULT MULTIVITAMIN W/MINERALS CH
1.0000 | ORAL_TABLET | Freq: Every day | ORAL | Status: DC
  Administered 2012-12-27 – 2012-12-30 (×4): 1 via ORAL
  Filled 2012-12-27 (×4): qty 1

## 2012-12-27 MED ORDER — PROPOFOL 10 MG/ML IV BOLUS
INTRAVENOUS | Status: DC | PRN
  Administered 2012-12-27: 40 mg via INTRAVENOUS
  Administered 2012-12-27: 150 mg via INTRAVENOUS

## 2012-12-27 MED ORDER — PREDNISONE 5 MG PO TABS
5.0000 mg | ORAL_TABLET | Freq: Every day | ORAL | Status: DC | PRN
  Filled 2012-12-27: qty 1

## 2012-12-27 MED ORDER — LACTATED RINGERS IR SOLN
Status: DC | PRN
  Administered 2012-12-27: 1000 mL

## 2012-12-27 MED ORDER — BUPIVACAINE-EPINEPHRINE PF 0.25-1:200000 % IJ SOLN
INTRAMUSCULAR | Status: DC | PRN
  Administered 2012-12-27: 50 mL

## 2012-12-27 MED ORDER — BUPIVACAINE 0.25 % ON-Q PUMP DUAL CATH 300 ML
INJECTION | Status: DC | PRN
  Administered 2012-12-27: 300 mL

## 2012-12-27 MED ORDER — INDIGOTINDISULFONATE SODIUM 8 MG/ML IJ SOLN
INTRAMUSCULAR | Status: DC | PRN
  Administered 2012-12-27: 5 mL via INTRAVENOUS

## 2012-12-27 MED ORDER — PREDNISONE 1 MG PO TABS
2.0000 mg | ORAL_TABLET | Freq: Two times a day (BID) | ORAL | Status: DC
  Administered 2012-12-27 – 2012-12-30 (×6): 2 mg via ORAL
  Filled 2012-12-27 (×8): qty 2

## 2012-12-27 MED ORDER — GLYCOPYRROLATE 0.2 MG/ML IJ SOLN
INTRAMUSCULAR | Status: DC | PRN
  Administered 2012-12-27: 0.6 mg via INTRAVENOUS

## 2012-12-27 MED ORDER — HYDROMORPHONE HCL PF 1 MG/ML IJ SOLN
0.2500 mg | INTRAMUSCULAR | Status: DC | PRN
  Administered 2012-12-27 (×4): 0.5 mg via INTRAVENOUS

## 2012-12-27 MED ORDER — SODIUM CHLORIDE 0.9 % IJ SOLN
3.0000 mL | Freq: Two times a day (BID) | INTRAMUSCULAR | Status: DC
  Administered 2012-12-28: 3 mL via INTRAVENOUS

## 2012-12-27 MED ORDER — LABETALOL HCL 5 MG/ML IV SOLN
INTRAVENOUS | Status: DC | PRN
  Administered 2012-12-27: 2.5 mg via INTRAVENOUS

## 2012-12-27 MED ORDER — DIPHENHYDRAMINE HCL 12.5 MG/5ML PO ELIX: 12.5000 mg | ORAL_SOLUTION | Freq: Four times a day (QID) | ORAL | Status: DC | PRN

## 2012-12-27 MED ORDER — SUFENTANIL CITRATE 50 MCG/ML IV SOLN
INTRAVENOUS | Status: DC | PRN
  Administered 2012-12-27: 10 ug via INTRAVENOUS
  Administered 2012-12-27: 5 ug via INTRAVENOUS
  Administered 2012-12-27: 10 ug via INTRAVENOUS
  Administered 2012-12-27: 20 ug via INTRAVENOUS
  Administered 2012-12-27: 5 ug via INTRAVENOUS
  Administered 2012-12-27 (×3): 10 ug via INTRAVENOUS
  Administered 2012-12-27: 5 ug via INTRAVENOUS

## 2012-12-27 MED ORDER — ALUM & MAG HYDROXIDE-SIMETH 200-200-20 MG/5ML PO SUSP: 30.0000 mL | Freq: Four times a day (QID) | ORAL | Status: DC | PRN

## 2012-12-27 MED ORDER — DEXTROSE 5 % IV SOLN
2.0000 g | INTRAVENOUS | Status: AC
  Administered 2012-12-27: 2 g via INTRAVENOUS
  Filled 2012-12-27: qty 2

## 2012-12-27 MED ORDER — BUPIVACAINE ON-Q PAIN PUMP (FOR ORDER SET NO CHG)
INJECTION | Status: DC
  Filled 2012-12-27: qty 1

## 2012-12-27 MED ORDER — ALVIMOPAN 12 MG PO CAPS
12.0000 mg | ORAL_CAPSULE | Freq: Once | ORAL | Status: AC
  Administered 2012-12-27: 12 mg via ORAL
  Filled 2012-12-27: qty 1

## 2012-12-27 MED ORDER — ZOLPIDEM TARTRATE 5 MG PO TABS: 5.0000 mg | ORAL_TABLET | Freq: Every evening | ORAL | Status: DC | PRN

## 2012-12-27 MED ORDER — VITAMIN C 500 MG PO TABS
500.0000 mg | ORAL_TABLET | Freq: Every day | ORAL | Status: DC
  Administered 2012-12-27 – 2012-12-30 (×4): 500 mg via ORAL
  Filled 2012-12-27 (×4): qty 1

## 2012-12-27 MED ORDER — METOPROLOL TARTRATE 1 MG/ML IV SOLN
5.0000 mg | Freq: Four times a day (QID) | INTRAVENOUS | Status: DC | PRN
  Filled 2012-12-27: qty 5

## 2012-12-27 SURGICAL SUPPLY — 88 items
APPLIER CLIP 5 13 M/L LIGAMAX5 (MISCELLANEOUS)
APPLIER CLIP ROT 10 11.4 M/L (STAPLE)
BLADE EXTENDED COATED 6.5IN (ELECTRODE) ×2 IMPLANT
BLADE HEX COATED 2.75 (ELECTRODE) ×2 IMPLANT
BLADE SURG SZ10 CARB STEEL (BLADE) ×4 IMPLANT
CABLE HIGH FREQUENCY MONO STRZ (ELECTRODE) ×2 IMPLANT
CANISTER SUCTION 2500CC (MISCELLANEOUS) ×2 IMPLANT
CATH KIT ON Q 7.5IN SLV (PAIN MANAGEMENT) ×4 IMPLANT
CELLS DAT CNTRL 66122 CELL SVR (MISCELLANEOUS) IMPLANT
CLIP APPLIE 5 13 M/L LIGAMAX5 (MISCELLANEOUS) IMPLANT
CLIP APPLIE ROT 10 11.4 M/L (STAPLE) IMPLANT
CLOTH BEACON ORANGE TIMEOUT ST (SAFETY) ×2 IMPLANT
CONNECTOR 5 IN 1 STRAIGHT STRL (MISCELLANEOUS) ×2 IMPLANT
COVER MAYO STAND STRL (DRAPES) ×4 IMPLANT
DECANTER SPIKE VIAL GLASS SM (MISCELLANEOUS) ×2 IMPLANT
DRAIN CHANNEL 19F RND (DRAIN) ×2 IMPLANT
DRAPE LAPAROSCOPIC ABDOMINAL (DRAPES) ×2 IMPLANT
DRAPE LG THREE QUARTER DISP (DRAPES) ×4 IMPLANT
DRAPE UTILITY 15X26 (DRAPE) ×4 IMPLANT
DRAPE WARM FLUID 44X44 (DRAPE) ×4 IMPLANT
DRSG TEGADERM 2-3/8X2-3/4 SM (GAUZE/BANDAGES/DRESSINGS) ×6 IMPLANT
DRSG TEGADERM 4X4.75 (GAUZE/BANDAGES/DRESSINGS) ×2 IMPLANT
ELECT REM PT RETURN 9FT ADLT (ELECTROSURGICAL) ×2
ELECTRODE REM PT RTRN 9FT ADLT (ELECTROSURGICAL) ×1 IMPLANT
ENDOLOOP SUT PDS II  0 18 (SUTURE) ×1
ENDOLOOP SUT PDS II 0 18 (SUTURE) ×1 IMPLANT
EVACUATOR 1/8 PVC DRAIN (DRAIN) IMPLANT
EVACUATOR DRAINAGE 10X20 100CC (DRAIN) ×2 IMPLANT
EVACUATOR SILICONE 100CC (DRAIN) ×2
GLOVE BIOGEL PI IND STRL 7.0 (GLOVE) ×4 IMPLANT
GLOVE BIOGEL PI INDICATOR 7.0 (GLOVE) ×4
GLOVE ECLIPSE 8.0 STRL XLNG CF (GLOVE) ×8 IMPLANT
GLOVE INDICATOR 8.0 STRL GRN (GLOVE) ×6 IMPLANT
GOWN STRL NON-REIN LRG LVL3 (GOWN DISPOSABLE) ×2 IMPLANT
GOWN STRL REIN XL XLG (GOWN DISPOSABLE) ×12 IMPLANT
KIT BASIN OR (CUSTOM PROCEDURE TRAY) ×2 IMPLANT
LEGGING LITHOTOMY PAIR STRL (DRAPES) ×4 IMPLANT
LIGASURE IMPACT 36 18CM CVD LR (INSTRUMENTS) IMPLANT
LUBRICANT JELLY K Y 4OZ (MISCELLANEOUS) ×4 IMPLANT
NS IRRIG 1000ML POUR BTL (IV SOLUTION) ×6 IMPLANT
PENCIL BUTTON HOLSTER BLD 10FT (ELECTRODE) ×2 IMPLANT
RTRCTR WOUND ALEXIS 18CM MED (MISCELLANEOUS)
SCISSORS LAP 5X35 DISP (ENDOMECHANICALS) ×2 IMPLANT
SEALER TISSUE G2 CVD JAW 35 (ENDOMECHANICALS) IMPLANT
SEALER TISSUE G2 CVD JAW 45CM (ENDOMECHANICALS)
SEALER TISSUE G2 STRG ARTC 35C (ENDOMECHANICALS) ×2 IMPLANT
SET CYSTO W/LG BORE CLAMP LF (SET/KITS/TRAYS/PACK) ×2 IMPLANT
SET IRRIG TUBING LAPAROSCOPIC (IRRIGATION / IRRIGATOR) ×2 IMPLANT
SLEEVE XCEL OPT CAN 5 100 (ENDOMECHANICALS) ×6 IMPLANT
SPONGE GAUZE 4X4 12PLY (GAUZE/BANDAGES/DRESSINGS) ×2 IMPLANT
SPONGE LAP 18X18 X RAY DECT (DISPOSABLE) ×6 IMPLANT
STAPLER CIRC ILS CVD 33MM 37CM (STAPLE) ×2 IMPLANT
STAPLER CUT CVD 40MM BLUE (STAPLE) ×2 IMPLANT
STAPLER VISISTAT 35W (STAPLE) ×2 IMPLANT
SUCTION POOLE TIP (SUCTIONS) ×2 IMPLANT
SUT ETHILON 2 0 PS N (SUTURE) ×2 IMPLANT
SUT PDS AB 1 CTX 36 (SUTURE) ×4 IMPLANT
SUT PDS AB 1 TP1 96 (SUTURE) IMPLANT
SUT PDS AB 2-0 CT2 27 (SUTURE) IMPLANT
SUT PDS AB 4-0 PS2 18 (SUTURE) IMPLANT
SUT PROLENE 0 CT 2 (SUTURE) ×2 IMPLANT
SUT PROLENE 2 0 CT2 30 (SUTURE) IMPLANT
SUT PROLENE 2 0 KS (SUTURE) IMPLANT
SUT SILK 2 0 (SUTURE) ×1
SUT SILK 2 0 SH CR/8 (SUTURE) ×2 IMPLANT
SUT SILK 2-0 18XBRD TIE 12 (SUTURE) ×1 IMPLANT
SUT SILK 3 0 (SUTURE) ×1
SUT SILK 3 0 SH CR/8 (SUTURE) ×2 IMPLANT
SUT SILK 3-0 18XBRD TIE 12 (SUTURE) ×1 IMPLANT
SUT VIC AB 2-0 SH 18 (SUTURE) ×2 IMPLANT
SUT VICRYL 2 0 18  UND BR (SUTURE)
SUT VICRYL 2 0 18 UND BR (SUTURE) IMPLANT
SYRINGE 10CC LL (SYRINGE) ×2 IMPLANT
SYS LAPSCP GELPORT 120MM (MISCELLANEOUS) ×2
SYSTEM LAPSCP GELPORT 120MM (MISCELLANEOUS) ×1 IMPLANT
TAPE UMBILICAL COTTON 1/8X30 (MISCELLANEOUS) ×2 IMPLANT
TOWEL OR 17X26 10 PK STRL BLUE (TOWEL DISPOSABLE) ×4 IMPLANT
TOWEL OR NON WOVEN STRL DISP B (DISPOSABLE) ×4 IMPLANT
TRAY FOLEY CATH 14FRSI W/METER (CATHETERS) ×2 IMPLANT
TRAY LAP CHOLE (CUSTOM PROCEDURE TRAY) ×2 IMPLANT
TROCAR BLADELESS OPT 5 100 (ENDOMECHANICALS) ×2 IMPLANT
TROCAR XCEL NON-BLD 11X100MML (ENDOMECHANICALS) ×2 IMPLANT
TUBING CONNECTING 10 (TUBING) ×4 IMPLANT
TUBING FILTER THERMOFLATOR (ELECTROSURGICAL) ×2 IMPLANT
TUNNELER SHEATH ON-Q 16GX12 DP (PAIN MANAGEMENT) ×2 IMPLANT
WATER STERILE IRR 1000ML POUR (IV SOLUTION) ×2 IMPLANT
YANKAUER SUCT BULB TIP 10FT TU (MISCELLANEOUS) ×4 IMPLANT
YANKAUER SUCT BULB TIP NO VENT (SUCTIONS) ×2 IMPLANT

## 2012-12-27 NOTE — Anesthesia Postprocedure Evaluation (Signed)
  Anesthesia Post-op Note  Patient: Drew Adams  Procedure(s) Performed: Procedure(s) (LRB): LAPAROSCOPIC  PARTIAL COLECTOMY RIGID PROCTOSCOPY  (N/A)  Patient Location: PACU  Anesthesia Type: General  Level of Consciousness: awake and alert   Airway and Oxygen Therapy: Patient Spontanous Breathing  Post-op Pain: mild  Post-op Assessment: Post-op Vital signs reviewed, Patient's Cardiovascular Status Stable, Respiratory Function Stable, Patent Airway and No signs of Nausea or vomiting  Last Vitals:  Filed Vitals:   12/27/12 1510  BP: 124/62  Pulse: 65  Temp: 36.4 C  Resp: 18    Post-op Vital Signs: stable   Complications: No apparent anesthesia complications

## 2012-12-27 NOTE — Interval H&P Note (Signed)
History and Physical Interval Note:  12/27/2012 7:13 AM  Drew Adams  has presented today for surgery, with the diagnosis of sigmoid colon polyp anemia  The various methods of treatment have been discussed with the patient and family. After consideration of risks, benefits and other options for treatment, the patient has consented to  Procedure(s): LAPAROSCOPIC POSSIBLE OPEN  PARTIAL COLECTOMY RIGID PROCTOSCOPY  (N/A) as a surgical intervention .  The patient's history has been reviewed, patient examined, no change in status, stable for surgery.  I have reviewed the patient's chart and labs.  Questions were answered to the patient's satisfaction.     Cosima Prentiss,Teige C.

## 2012-12-27 NOTE — Anesthesia Preprocedure Evaluation (Addendum)
Anesthesia Evaluation  Patient identified by MRN, date of birth, ID band Patient awake    Reviewed: Allergy & Precautions, H&P , NPO status , Patient's Chart, lab work & pertinent test results  Airway Mallampati: II TM Distance: >3 FB Neck ROM: Full    Dental no notable dental hx.    Pulmonary neg pulmonary ROS,  breath sounds clear to auscultation  Pulmonary exam normal       Cardiovascular Exercise Tolerance: Good negative cardio ROS  Rhythm:Regular Rate:Normal  Plt 145K   Neuro/Psych  Neuromuscular disease negative psych ROS   GI/Hepatic negative GI ROS, Neg liver ROS,   Endo/Other  negative endocrine ROS  Renal/GU negative Renal ROSCr 1.39 K 4.0  negative genitourinary   Musculoskeletal negative musculoskeletal ROS (+)   Abdominal   Peds negative pediatric ROS (+)  Hematology negative hematology ROS (+)   Anesthesia Other Findings   Reproductive/Obstetrics negative OB ROS                         Anesthesia Physical Anesthesia Plan  ASA: II  Anesthesia Plan: General   Post-op Pain Management:    Induction: Intravenous  Airway Management Planned: Oral ETT  Additional Equipment:   Intra-op Plan:   Post-operative Plan: Extubation in OR  Informed Consent: I have reviewed the patients History and Physical, chart, labs and discussed the procedure including the risks, benefits and alternatives for the proposed anesthesia with the patient or authorized representative who has indicated his/her understanding and acceptance.   Dental advisory given  Plan Discussed with: CRNA  Anesthesia Plan Comments:         Anesthesia Quick Evaluation

## 2012-12-27 NOTE — Op Note (Signed)
12/27/2012  10:53 AM  PATIENT:  Drew Adams  72 y.o. male  Patient Care Team: Marden Noble, MD as PCP - General (Internal Medicine) Barrie Folk, MD as Consulting Physician (Gastroenterology)  PRE-OPERATIVE DIAGNOSIS:  sigmoid colon polyp anemia  POST-OPERATIVE DIAGNOSIS:  sigmoid colon polyp with anemia  PROCEDURE:  Procedure(s): LAPAROSCOPIC  PARTIAL COLECTOMY RIGID PROCTOSCOPY   SURGEON:  Surgeon(s): Ardeth Sportsman, MD  ASSISTANT: Axel Filler, MD   ANESTHESIA:   local and general  EBL:  Total I/O In: 3200 [I.V.:3200] Out: 600 [Urine:350; Blood:250]  Delay start of Pharmacological VTE agent (>24hrs) due to surgical blood loss or risk of bleeding:  no  DRAINS: (19) Blake drain(s) in the pelvis   SPECIMEN:  Source of Specimen:  Rectosigmoid colon (open end proximal).  Anastomotic rings (blue stitch in proximal ring)  DISPOSITION OF SPECIMEN:  PATHOLOGY  COUNTS:  YES  PLAN OF CARE: Admit to inpatient   PATIENT DISPOSITION:  PACU - hemodynamically stable.  INDICATION:    Pleasant gentleman with anemia.  Found to have large polyp in sigmoid colon.  Felt not safe to remove endoscopically.  Surgical removal requested.    I recommended segmental resection:  The anatomy & physiology of the digestive tract was discussed.  The pathophysiology was discussed.  Natural history risks without surgery was discussed.   I worked to give an overview of the disease and the frequent need to have multispecialty involvement.  I feel the risks of no intervention will lead to serious problems that outweigh the operative risks; therefore, I recommended a partial colectomy to remove the pathology.  Laparoscopic & open techniques were discussed.   Risks such as bleeding, infection, abscess, leak, reoperation, possible ostomy, hernia, heart attack, death, and other risks were discussed.  I noted a good likelihood this will help address the problem.   Goals of post-operative recovery were  discussed as well.  We will work to minimize complications.  Educational materials on the pathology had been given in the office.  Questions were answered.    The patient expressed understanding & wished to proceed with surgery.  OR FINDINGS:   Patient had Large pedunculated polyp with some central irregularity in the mid sigmoid colon  No obvious metastatic disease on visceral parietal peritoneum or liver.  The anastomosis rests 18 cm from the anal verge by rigid proctoscopy.  DESCRIPTION:   Informed consent was confirmed.  The patient underwent general anaesthesia without difficulty.  The patient was positioned appropriately.  VTE prevention in place.  The patient's abdomen was clipped, prepped, & draped in a sterile fashion.  Surgical timeout confirmed our plan.  The patient was positioned in reverse Trendelenburg.  Abdominal entry was gained using optical entry technique in the  right upper abdomen.  Entry was clean.  I induced carbon dioxide insufflation.  Camera inspection revealed no injury.  Extra ports were carefully placed under direct laparoscopic visualization.  I reflected the greater omentum and the upper abdomen the small bowel in the upper abdomen. I scored the base of peritoneum of the right side of the mesentery of the left colon from the ligament of Treitz to the peritoneal rectal reflection.   The patient had a very floppy redundant sigmoid colon.  I elevated the sigmoid mesentery and got into the retro-mesenteric plane. Patient had a somewhat aneurysmal left iliac vessel.  We made attempts to identify the left ureter and gonadal vessels. Confine the gonadal easily.  The left ureter was a little more  challenging to find.  We kept The gonadal and suspected ureter posterior within the retroperitoneum and elevated the left colon mesentery off that.  I did free off of mesentery to the IMA pedicle but did not ligate it yet.  I continued distally and got into the avascular plane  posterior to the mesorectum. This allowed me to help mobilize the rectum as well by freeing the proximal mesorectum off the sacrum.  I mobilized the peritoneal coverings to the peritoneal reflection on both the right and left sides of the rectum.  I mobilized the lateral to medial fashion.  This allowed me to reflect the descending and sigmoid colon off its retroperitoneal attachments to the left kidney.  With that reflection I could better see the retroperitoneal structures.  I was able to identify the left ureter and follow its course.  No evidence of injury.  I could see the right and left ureters and stayed away from them.  I kept in the lateral vascular pedicles to the rectum intact.  I skeletonized the lymph nodes off the inferior mesenteric artery pedicle.  I went down to its takeoff from the aorta.  I isolated the inferior mesenteric vein just distal to of the ligament of Treitz just cephalad to that as well.  After confirming the left ureter was out of the way, I went ahead and ligated the inferior mesenteric artery pedicle with bipolar EnSeal just near its takeoff from the aorta.   I reinforced ligation with a 0 PDS Endoloop.  We ensured hemostasis. I skeletonized the mesorectum at the junction at the proximal rectum using blunt dissection & bipolar EnSeal.  I mobilized the left colon some more to ensure good mobilization of the left colon to reach into the pelvis.  Around this time of dissection hematuria was noted  I placed a GelPort has a wound protector through a Pfannenstiel incision in the suprapubic region, taking care to avoid bladder injury. I had a 5 mm port in the right suprapubic region.  I inspected and it seemed to be away from the bladder.  I transected bowel at the junction between the proximal and mid rectum using a contour stapler. I was able to eviscerate the rectosigmoid and descending colon out the wound.  I chose a region at the descending/sigmoid junction that was soft and easily  reached down. I clamped the colon proximal to this area using a soft bowel clamp. I transected at the descending/sigmoid junction with a scalpel. I got healthy bleeding mucosa. I transected the remaining specimen mesentery in a radial fashion to preserve good blood supply to the proximal colon end.  We sent the rectosigmoid colon specimen off to go to pathology. We sized the colon orifice.  I chose a 33 EEA anvil stapler system. I placed the anvil to the open end of the descending colon and closed around it using a 0 Prolene pursestring.  We did copious irrigation with crystalloid solution.  Hemostasis was good.      Dr. Derrell Lolling scrubbed down and did gentle anal dilation and advanced the EEA stapler up the rectal stump. The spike was brought out at the provimal end of the rectal stump under direct visualization.  I attached the anvil of the proximal colon the spike of the stapler. Anvil was tightened down and held clamped for 60 seconds. The EEA stapler was fired and held clamped for 30 seconds. The stapler was released & removed. We noted 2 excellent anastomotic rings. Blue stitch is in the  proximal ring.  Dr Derrell Lolling did rigid proctoscopy noted the anastomosis was at 18 cm from the anal verge consistent with the proximal rectum.  We did a final irrigation of antibiotic solution (900 mg clindamycin/240 mg gentamicin in a liter of crystalloid) & held that for the pelvic air leak test .  The rectum was insufflated the rectum while clamping the colon proximal to that anastomosis.  There was a negative air leak test. There was no tension. Anastomosis & colon looked viable.    We changed gown and gloves.  We aspirated the antibiotic irrigation.  Hemostasis was good.   Ureters & bowel uninjured.  The anastomosis looked healthy.  We insufflated the bladder with blue dye crystalloid solution.  The bladder distended well.  There was no leak of the bladder and it was away from the right suprapubic port site.  I placed a  2-0 Vicryl stitch around the preperitoneal 5mm portsite just in case.  Anesthesia gave indigo carmine.  After waiting 15 minutes we side no evidence of leak along the ureters nor any retroperitoneal blue staining, arguing against a injury to the ureters.  The hematuria seemed to calm down some.  I placed On-Q catheter and sheaths into the preperitoneal space under direct palpation.  I removed CO2 gas out through the ports.  I closed the 5mm port sites using Monocryl stitch and sterile dressing.  I closed the Pfannenstiel wound using a 0 Vicryl vertical peritoneal closure and a #1 PDS transverse anterior rectal fascial closure. I closed the skin with some interrupted Monocryl stitches. I placed antibiotic-soaked wicks into the closure at the corners & centrally x4 between those areas. I placed a sterile dressing.  OnQ catheters placed & sheaths peeled away.  Patient is being extubated go to recovery room. I discussed postop care with the patient in detail the office & in the holding area. Instructions are written. I'm about to locate family and discuss it with them as well.

## 2012-12-27 NOTE — H&P (View-Only) (Signed)
Subjective:     Patient ID: Drew Adams, male   DOB: 10/02/1940, 72 y.o.   MRN: [id number]  HPI  Drew Adams  1941-06-06 [id number]  Patient Care Team: Marden Noble, MD as PCP - General (Internal Medicine) Barrie Folk, MD as Consulting Physician (Gastroenterology)  This patient is a 72 y.o.male who presents today for surgical evaluation at the request of Dr Madilyn Fireman.   Reason for visit: 4 cm polyp in sigmoid colon.  Likely source of iron deficiency anemia.  Pleasant elderly gentleman.  Moderately severe gout.  On intermittent prednisone.  Takes a couple doses a week.  Used to walk more regularly but now is limited to about 15 minutes secondary to joint pain.  No cardiopulmonary issues.   No exertional chest/neck/shoulder/arm pain.   Has had worsening anemia over the past year or so.  Underwent screening colonoscopy for heme positive stools.  Found to have large polyp in sigmoid colon by Dr Madilyn Fireman.  4 x 2 cm.  He did not feel it was safe to remove endoscopically.  He was concerned about possible early cancer.  Area not tattooed.  Surgical consultation requested.  Patient normally has a bowel movement every day.  No personal nor family history of GI/colon cancer, inflammatory bowel disease, irritable bowel syndrome, allergy such as Celiac Sprue, dietary/dairy problems, colitis, ulcers nor gastritis.  No recent sick contacts/gastroenteritis.  No travel outside the country.  No changes in diet.    Patient Active Problem List   Diagnosis Date Noted  . Adenomatous polyp of sigmoid colon 11/28/2012  . Iron deficiency anemia 11/28/2012  . Paraesophageal hiatal hernia 11/28/2012  . Diverticulosis of colon without hemorrhage 11/28/2012  . Gout     Past Medical History  Diagnosis Date  . Gout     uses prednisone 1-2x/week  . Iron deficiency anemia 11/28/2012  . Paraesophageal hiatal hernia 11/28/2012  . Diverticulosis of colon without hemorrhage 11/28/2012    Past Surgical History  Procedure  Laterality Date  . Hernia repair    . Tonsillectomy and adenoidectomy      History   Social History  . Marital Status: Married    Spouse Name: N/A    Number of Children: N/A  . Years of Education: N/A   Occupational History  . Not on file.   Social History Main Topics  . Smoking status: Never Smoker   . Smokeless tobacco: Never Used  . Alcohol Use: No  . Drug Use: No  . Sexually Active: Not on file   Other Topics Concern  . Not on file   Social History Narrative  . No narrative on file    Family History  Problem Relation Age of Onset  . Parkinson's disease Father   . Diabetes Father     Current Outpatient Prescriptions  Medication Sig Dispense Refill  . allopurinol (ZYLOPRIM) 300 MG tablet       . calcium citrate (CALCITRATE - DOSED IN MG ELEMENTAL CALCIUM) 950 MG tablet Take 1 tablet by mouth daily.      . celecoxib (CELEBREX) 200 MG capsule Take 200 mg by mouth 2 (two) times daily.      . Cholecalciferol (VITAMIN D-3) 5000 UNITS TABS Take by mouth.      . Cholecalciferol (VITAMIN D3) 10000 UNITS capsule Take 10,000 Units by mouth daily.      Marland Kitchen COLCRYS 0.6 MG tablet       . glucosamine-chondroitin 500-400 MG tablet Take 1 tablet by mouth 3 (three) times  daily.      . magnesium oxide (MAG-OX) 400 MG tablet Take 400 mg by mouth daily.      Marland Kitchen OVER THE COUNTER MEDICATION Trunature [facility]      . polyethylene glycol-electrolytes (NULYTELY/GOLYTELY) 420 G solution       . predniSONE (DELTASONE) 5 MG tablet       . metroNIDAZOLE (FLAGYL) 500 MG tablet Take 1 tablet (500 mg total) by mouth as directed. Take 2 pills (=1000mg ) by mouth at 1pm, 3pm, and 10pm the day before your colorectal operation as discussed in [facility] office.  Call [phone] with questions  6 tablet  0  . neomycin (MYCIFRADIN) 500 MG tablet Take 2 tablets (1,000 mg total) by mouth as directed. Take 2 pills (=1000mg ) by mouth at 1pm, 3pm, and 10pm the day before your colorectal operation as  discussed in [facility] office.  Call [phone] with questions  6 tablet  0   No current facility-administered medications for this visit.     No Known Allergies  BP 176/84  Pulse 96  Temp(Src) 98.9 F (37.2 C) (Oral)  Resp 20  Ht 5\' 10"  (1.778 m)  Wt 181 lb 9.6 oz (82.373 kg)  BMI 26.06 kg/m2  No results found.   Review of Systems  Constitutional: Negative for fever, chills, diaphoresis, activity change, fatigue and unexpected weight change.  HENT: Negative for nosebleeds, sore throat, facial swelling, mouth sores, trouble swallowing and ear discharge.   Eyes: Negative for photophobia, discharge and visual disturbance.  Respiratory: Negative for choking, chest tightness, shortness of breath and stridor.   Cardiovascular: Negative for chest pain and palpitations.  Gastrointestinal: Negative for nausea, vomiting, abdominal pain, diarrhea, constipation, abdominal distention, anal bleeding and rectal pain.  Endocrine: Negative for cold intolerance and heat intolerance.  Genitourinary: Negative for dysuria, urgency, difficulty urinating and testicular pain.  Musculoskeletal: Positive for joint swelling and arthralgias. Negative for myalgias, back pain and gait problem.  Skin: Negative for color change, pallor, rash and wound.  Allergic/Immunologic: Negative for environmental allergies and food allergies.  Neurological: Negative for dizziness, speech difficulty, weakness, numbness and headaches.  Hematological: Negative for adenopathy. Does not bruise/bleed easily.  Psychiatric/Behavioral: Negative for hallucinations, confusion and agitation.       Objective:   Physical Exam  Constitutional: He is oriented to person, place, and time. He appears well-developed and well-nourished. No distress.  HENT:  Head: Normocephalic.  Mouth/Throat: Oropharynx is clear and moist. No oropharyngeal exudate.  Eyes: Conjunctivae and EOM are normal. Pupils are equal, round, and reactive to light. No  scleral icterus.  Neck: Normal range of motion. Neck supple. No tracheal deviation present.  Cardiovascular: Normal rate, regular rhythm and intact distal pulses.   Pulmonary/Chest: Effort normal and breath sounds normal. No respiratory distress.  Abdominal: Soft. He exhibits no distension. There is no tenderness. There is no rigidity, no guarding, no CVA tenderness, no tenderness at McBurney's point and negative Murphy's sign. No hernia. Hernia confirmed negative in the right inguinal area and confirmed negative in the left inguinal area.    Obese but soft.  Genitourinary:  ? small impulse in left groin but no definite inguinal hernia   Musculoskeletal: Normal range of motion. He exhibits no tenderness.  Lymphadenopathy:    He has no cervical adenopathy.       Right: No inguinal adenopathy present.       Left: No inguinal adenopathy present.  Neurological: He is alert and oriented to person, place, and time.  No cranial nerve deficit. He exhibits normal muscle tone. Coordination normal.  Skin: Skin is warm and dry. No rash noted. He is not diaphoretic. No erythema. No pallor.  Psychiatric: He has a normal mood and affect. His behavior is normal. Judgment and thought content normal.       Assessment:     4 cm polyp in sigmoid colon, unresectable by endoscopy.     Plan:     Given the fact is the source of anemia of moderate size and not removable endoscopically, I recommended segmental resection of the colon containing the mass, Probable sigmoidectomy.  Reasonable laparoscopic candidate.Will try to make sure that it is not in the rectum by rigid proctoscopy on the table.  We will hold off on tattooing if possible since of moderate size.  The anatomy & physiology of the digestive tract was discussed.  The pathophysiology was discussed.  Natural history risks without surgery was discussed.   I feel the risks of no intervention will lead to serious problems that outweigh the operative risks;  therefore, I recommended a partial colectomy to remove the pathology.  Laparoscopic & open techniques were discussed.   Risks such as bleeding, infection, abscess, leak, reoperation, possible ostomy, hernia, heart attack, death, and other risks were discussed.  I noted a good likelihood this will help address the problem.   Goals of post-operative recovery were discussed as well.  We will work to minimize complications.  An educational handout on the pathology was given as well.  Questions were answered.  The patient expresses understanding & wishes to proceed with surgery.

## 2012-12-27 NOTE — Transfer of Care (Signed)
Immediate Anesthesia Transfer of Care Note  Patient: Drew Adams  Procedure(s) Performed: Procedure(s): LAPAROSCOPIC  PARTIAL COLECTOMY RIGID PROCTOSCOPY  (N/A)  Patient Location: PACU  Anesthesia Type:General  Level of Consciousness: awake, alert , oriented and patient cooperative  Airway & Oxygen Therapy: Patient Spontanous Breathing and Patient connected to face mask oxygen  Post-op Assessment: Report given to PACU RN  Post vital signs: stable  Complications: No apparent anesthesia complications

## 2012-12-28 ENCOUNTER — Encounter (HOSPITAL_COMMUNITY): Payer: Self-pay | Admitting: Surgery

## 2012-12-28 DIAGNOSIS — N182 Chronic kidney disease, stage 2 (mild): Secondary | ICD-10-CM

## 2012-12-28 LAB — CBC
HCT: 29.6 % — ABNORMAL LOW (ref 39.0–52.0)
MCHC: 31.1 g/dL (ref 30.0–36.0)
Platelets: 156 10*3/uL (ref 150–400)
RDW: 22.1 % — ABNORMAL HIGH (ref 11.5–15.5)
WBC: 12 10*3/uL — ABNORMAL HIGH (ref 4.0–10.5)

## 2012-12-28 LAB — BASIC METABOLIC PANEL
BUN: 25 mg/dL — ABNORMAL HIGH (ref 6–23)
Calcium: 8.4 mg/dL (ref 8.4–10.5)
Chloride: 105 mEq/L (ref 96–112)
Creatinine, Ser: 1.32 mg/dL (ref 0.50–1.35)
GFR calc Af Amer: 61 mL/min — ABNORMAL LOW (ref >90)
GFR calc non Af Amer: 53 mL/min — ABNORMAL LOW (ref >90)

## 2012-12-28 MED ORDER — PROMETHAZINE HCL 25 MG/ML IJ SOLN: 6.2500 mg | Freq: Four times a day (QID) | INTRAMUSCULAR | Status: DC | PRN

## 2012-12-28 MED ORDER — LACTATED RINGERS IV BOLUS (SEPSIS): 1000.0000 mL | Freq: Three times a day (TID) | INTRAVENOUS | Status: AC | PRN

## 2012-12-28 MED ORDER — SODIUM CHLORIDE 0.9 % IJ SOLN: 3.0000 mL | INTRAMUSCULAR | Status: DC | PRN

## 2012-12-28 MED ORDER — SODIUM CHLORIDE 0.9 % IJ SOLN
3.0000 mL | Freq: Two times a day (BID) | INTRAMUSCULAR | Status: DC
  Administered 2012-12-29 (×2): 3 mL via INTRAVENOUS

## 2012-12-28 NOTE — Progress Notes (Signed)
Drew Adams [id number] [birth year]  CARE TEAM:  PCP: Pearla Dubonnet, MD  Outpatient Care Team: Patient Care Team: Marden Noble, MD as PCP - General (Internal Medicine) Barrie Folk, MD as Consulting Physician (Gastroenterology)  Inpatient Treatment Team: Treatment Team: Attending Provider: Ardeth Sportsman, MD; Technician: Vella Raring, NT; Registered Nurse: Georgeanne Nim Debrew, RN   Subjective:  Minimal soreness Wife in room Tol clears.  No N/V Walked in hallways last night  Objective:  Vital signs:  Filed Vitals:   12/27/12 1800 12/27/12 2110 12/28/12 0055 12/28/12 0510  BP: 122/70 110/68 115/67 118/70  Pulse: 86 80 74 82  Temp: 97.6 F (36.4 C) 98.2 F (36.8 C) 98.2 F (36.8 C) 98.4 F (36.9 C)  TempSrc: Axillary Oral Oral Oral  Resp:  18 18 18   Height:      Weight:    194 lb 14.2 oz (88.4 kg)  SpO2: 99% 99% 99% 99%       Intake/Output   Yesterday:  06/03 0701 - 06/04 0700 In: 4774.2 [P.O.:360; I.V.:4414.2] Out: 2415 [Urine:1760; Drains:405; Blood:250] This shift:  Total I/O In: 814.2 [I.V.:814.2] Out: 440 [Urine:350; Drains:90]  Bowel function:  Flatus: n  BM: n  Drain: serosanguinous  Physical Exam:  General: Pt awake/alert/oriented x4 in no acute distress Eyes: PERRL, normal EOM.  Sclera clear.  No icterus Neuro: CN II-XII intact w/o focal sensory/motor deficits. Lymph: No head/neck/groin lymphadenopathy Psych:  No delerium/psychosis/paranoia HENT: Normocephalic, Mucus membranes moist.  No thrush Neck: Supple, No tracheal deviation Chest: No chest wall pain w good excursion CV:  Pulses intact.  Regular rhythm MS: Normal AROM mjr joints.  No obvious deformity Abdomen: Soft.  Nondistended.  Dressings clean/dry.  Mildly tender at incisions only.  No evidence of peritonitis.  No incarcerated hernias. GU: NEMG.  Clear light yellow urine - no more hematuria Ext:  SCDs BLE.  No mjr edema.  No cyanosis Skin: No petechiae /  purpura   Problem List:   Principal Problem:   Adenomatous polyp of sigmoid colon Active Problems:   Iron deficiency anemia   Diverticulosis of colon without hemorrhage   Gout   CKD (chronic kidney disease) stage 2, GFR 60-89 ml/min   Assessment  Drew Adams  72 y.o. male  1 Day Post-Op  Procedure(s): LAPAROSCOPIC  PARTIAL COLECTOMY RIGID PROCTOSCOPY   Recovering well so far  Plan:  -adv diet gradually -min IVF -f/u path -anemia - MVI & follow.  Should recover w bleeding source gone -Tx for gout prophylaxis -d/c foley in AM, POD#2 -VTE prophylaxis- SCDs, etc -mobilize as tolerated to help recovery  Ardeth Sportsman, M.D., F.A.C.S. Gastrointestinal and Minimally Invasive Surgery [facility] [address], [address] [place], [place] [address] [phone] Main / Paging   12/28/2012   Results:   Labs: Results for orders placed during the hospital encounter of 12/27/12 (from the past 48 hour(s))  BASIC METABOLIC PANEL     Status: Abnormal   Collection Time    12/28/12  4:05 AM      Result Value Range   Sodium 139  135 - 145 mEq/L   Potassium 4.1  3.5 - 5.1 mEq/L   Chloride 105  96 - 112 mEq/L   CO2 28  19 - 32 mEq/L   Glucose, Bld 121 (*) 70 - 99 mg/dL   BUN 25 (*) 6 - 23 mg/dL   Creatinine, Ser 2.95  0.50 - 1.35 mg/dL   Calcium 8.4  8.4 -  10.5 mg/dL   GFR calc non Af Amer 53 (*) >90 mL/min   GFR calc Af Amer 61 (*) >90 mL/min   Comment:            The eGFR has been calculated     using the CKD EPI equation.     This calculation has not been     validated in all clinical     situations.     eGFR's persistently     <90 mL/min signify     possible Chronic Kidney Disease.  CBC     Status: Abnormal   Collection Time    12/28/12  4:05 AM      Result Value Range   WBC 12.0 (*) 4.0 - 10.5 K/uL   RBC 3.65 (*) 4.22 - 5.81 MIL/uL   Hemoglobin 9.2 (*) 13.0 - 17.0 g/dL   HCT 40.9 (*) 81.1 - 91.4 %   MCV 81.1  78.0 - 100.0 fL   MCH  25.2 (*) 26.0 - 34.0 pg   MCHC 31.1  30.0 - 36.0 g/dL   RDW 78.2 (*) 95.6 - 21.3 %   Platelets 156  150 - 400 K/uL  MAGNESIUM     Status: None   Collection Time    12/28/12  4:05 AM      Result Value Range   Magnesium 2.1  1.5 - 2.5 mg/dL    Imaging / Studies: No results found.  Medications / Allergies: per chart  Antibiotics: Anti-infectives   Start     Dose/Rate Route Frequency Ordered Stop   12/27/12 2000  cefoTEtan (CEFOTAN) 2 g in dextrose 5 % 50 mL IVPB     2 g 100 mL/hr over 30 Minutes Intravenous Every 12 hours 12/27/12 1216 12/27/12 2042   12/27/12 0816  polymyxin 500,000 units/gentamicin 80 mg/0.9 % sodium chloride ophth. irrigation  Status:  Discontinued       As needed 12/27/12 0816 12/27/12 1100   12/27/12 0600  clindamycin (CLEOCIN) 900 mg, gentamicin (GARAMYCIN) 240 mg in sodium chloride 0.9 % 1,000 mL for intraperitoneal lavage  Status:  Discontinued      Intraperitoneal To Surgery 12/26/12 1944 12/27/12 1209   12/27/12 0516  cefoTEtan (CEFOTAN) 2 g in dextrose 5 % 50 mL IVPB     2 g 100 mL/hr over 30 Minutes Intravenous On call to O.R. 12/27/12 0865 12/27/12 7846

## 2012-12-28 NOTE — Care Management Note (Signed)
    Page 1 of 1   12/28/2012     11:36:35 AM   CARE MANAGEMENT NOTE 12/28/2012  Patient:  Drew Adams, Drew Adams   Account Number:  [id number]  Date Initiated:  12/28/2012  Documentation initiated by:  Lorenda Ishihara  Subjective/Objective Assessment:   72 yo male admitted s/p lap colectomy. PTA lived at home with spouse.     Action/Plan:   Home when stable   Anticipated DC Date:  12/31/2012   Anticipated DC Plan:  HOME/SELF CARE      DC Planning Services  CM consult      Choice offered to / List presented to:             Status of service:  Completed, signed off Medicare Important Message given?   (If response is "NO", the following Medicare IM given date fields will be blank) Date Medicare IM given:   Date Additional Medicare IM given:    Discharge Disposition:  HOME/SELF CARE  Per UR Regulation:  Reviewed for med. necessity/level of care/duration of stay  If discussed at Long Length of Stay Meetings, dates discussed:    Comments:

## 2012-12-29 DIAGNOSIS — M109 Gout, unspecified: Secondary | ICD-10-CM

## 2012-12-29 LAB — POTASSIUM: Potassium: 4.3 mEq/L (ref 3.5–5.1)

## 2012-12-29 LAB — CREATININE, SERUM: Creatinine, Ser: 1.43 mg/dL — ABNORMAL HIGH (ref 0.50–1.35)

## 2012-12-29 LAB — HEMOGLOBIN: Hemoglobin: 8.5 g/dL — ABNORMAL LOW (ref 13.0–17.0)

## 2012-12-29 MED ORDER — VITAMIN D3 250 MCG (10000 UT) PO CAPS: 10000.0000 [IU] | ORAL_CAPSULE | Freq: Every day | ORAL | Status: DC

## 2012-12-29 MED ORDER — FERROUS FUMARATE 325 (106 FE) MG PO TABS
1.0000 | ORAL_TABLET | Freq: Two times a day (BID) | ORAL | Status: DC
  Administered 2012-12-29 – 2012-12-30 (×3): 106 mg via ORAL
  Filled 2012-12-29 (×4): qty 1

## 2012-12-29 MED ORDER — OXYCODONE HCL 5 MG PO TABS
5.0000 mg | ORAL_TABLET | ORAL | Status: DC | PRN
  Administered 2012-12-29 – 2012-12-30 (×3): 10 mg via ORAL
  Filled 2012-12-29 (×3): qty 2

## 2012-12-29 MED ORDER — PREDNISONE 10 MG PO TABS
10.0000 mg | ORAL_TABLET | Freq: Once | ORAL | Status: AC
  Administered 2012-12-29: 10 mg via ORAL
  Filled 2012-12-29: qty 1

## 2012-12-29 NOTE — Progress Notes (Addendum)
Drew Adams [id number] [birth year]  CARE TEAM:  PCP: Pearla Dubonnet, MD  Outpatient Care Team: Patient Care Team: Marden Noble, MD as PCP - General (Internal Medicine) Barrie Folk, MD as Consulting Physician (Gastroenterology)  Inpatient Treatment Team: Treatment Team: Attending Provider: Ardeth Sportsman, MD; Technician: Vella Raring, NT; Registered Nurse: Jonathon Bellows, RN; Technician: Melvenia Beam, NT; Registered Nurse: Bethann Goo, RN   Subjective:  Minimal soreness Left knee swelling.  Thinks that his gout is back. Tol thicker liquids.  No N/V Some frequent BMs Walked in hallways last night Foley out - urinating fone.  No hematuria  Objective:  Vital signs:  Filed Vitals:   12/28/12 1000 12/28/12 1400 12/28/12 2104 12/29/12 0626  BP: 113/62 130/63 127/57 122/56  Pulse: 88 90 105 95  Temp: 98.9 F (37.2 C) 98.9 F (37.2 C) 99.6 F (37.6 C) 98 F (36.7 C)  TempSrc: Oral Oral Oral Oral  Resp: 18 18 18 18   Height:      Weight:      SpO2: 100% 100% 95% 93%    Last BM Date: 12/28/12  Intake/Output   Yesterday:  06/04 0701 - 06/05 0700 In: 480 [P.O.:480] Out: 2045 [Urine:1950; Drains:90; Stool:5] This shift:  Total I/O In: 240 [P.O.:240] Out: -   Bowel function:  Flatus: Yes  BM: Yes, mostly well formed  Drain: serosanguinous  Physical Exam:  General: Pt awake/alert/oriented x4 in no acute distress Eyes: PERRL, normal EOM.  Sclera clear.  No icterus Neuro: CN II-XII intact w/o focal sensory/motor deficits. Lymph: No head/neck/groin lymphadenopathy Psych:  No delerium/psychosis/paranoia HENT: Normocephalic, Mucus membranes moist.  No thrush Neck: Supple, No tracheal deviation Chest: No chest wall pain w good excursion CV:  Pulses intact.  Regular rhythm MS: Normal AROM mjr joints.  No obvious deformity Abdomen: Soft.  Nondistended.  Dressings clean/dry.  Mildly tender at incisions only.  No evidence of peritonitis.  No  incarcerated hernias. GU: NEMG.  Clear light yellow urine - no more hematuria Ext:  SCDs BLE.  No mjr edema.  No cyanosis.  Mild left Knee soreness but no significant swelling.  Symmetrical to right side.  Range of motion within normal limits Skin: No petechiae / purpura   Problem List:   Principal Problem:   Adenomatous polyp of sigmoid colon Active Problems:   Iron deficiency anemia   Diverticulosis of colon without hemorrhage   Gout   CKD (chronic kidney disease) stage 2, GFR 60-89 ml/min   Assessment  Drew Adams  72 y.o. male  2 Days Post-Op  Procedure(s): LAPAROSCOPIC  PARTIAL COLECTOMY RIGID PROCTOSCOPY   Recovering well so far  Plan:  -adv diet  -follow off IVF -f/u path -switch to oral pain control -anemia - MVI & add iron.  Should recover w bleeding source gone -Tx for gout prophylaxis.  Give 10 mg of prednisone per the patient's anecdotal experience -follow with Foley out -VTE prophylaxis- SCDs, etc -mobilize as tolerated to help recovery  D/C patient from hospital when patient meets criteria (anticipate in 1-2 day(s)):  Tolerating oral intake well Ambulating in walkways Adequate pain control without IV medications Urinating  Having flatus   Ardeth Sportsman, M.D., F.A.C.S. Gastrointestinal and Minimally Invasive Surgery [facility] [address], [address] [place], [place] [address] [phone] Main / Paging   12/29/2012   Results:   Labs: Results for orders placed during the hospital encounter of 12/27/12 (from the past 48 hour(s))  BASIC METABOLIC PANEL  Status: Abnormal   Collection Time    12/28/12  4:05 AM      Result Value Range   Sodium 139  135 - 145 mEq/L   Potassium 4.1  3.5 - 5.1 mEq/L   Chloride 105  96 - 112 mEq/L   CO2 28  19 - 32 mEq/L   Glucose, Bld 121 (*) 70 - 99 mg/dL   BUN 25 (*) 6 - 23 mg/dL   Creatinine, Ser 1.61  0.50 - 1.35 mg/dL   Calcium 8.4  8.4 - 09.6 mg/dL   GFR calc non Af Amer  53 (*) >90 mL/min   GFR calc Af Amer 61 (*) >90 mL/min   Comment:            The eGFR has been calculated     using the CKD EPI equation.     This calculation has not been     validated in all clinical     situations.     eGFR's persistently     <90 mL/min signify     possible Chronic Kidney Disease.  CBC     Status: Abnormal   Collection Time    12/28/12  4:05 AM      Result Value Range   WBC 12.0 (*) 4.0 - 10.5 K/uL   RBC 3.65 (*) 4.22 - 5.81 MIL/uL   Hemoglobin 9.2 (*) 13.0 - 17.0 g/dL   HCT 04.5 (*) 40.9 - 81.1 %   MCV 81.1  78.0 - 100.0 fL   MCH 25.2 (*) 26.0 - 34.0 pg   MCHC 31.1  30.0 - 36.0 g/dL   RDW 91.4 (*) 78.2 - 95.6 %   Platelets 156  150 - 400 K/uL  MAGNESIUM     Status: None   Collection Time    12/28/12  4:05 AM      Result Value Range   Magnesium 2.1  1.5 - 2.5 mg/dL  HEMOGLOBIN     Status: Abnormal   Collection Time    12/29/12  4:10 AM      Result Value Range   Hemoglobin 8.5 (*) 13.0 - 17.0 g/dL  POTASSIUM     Status: None   Collection Time    12/29/12  4:10 AM      Result Value Range   Potassium 4.3  3.5 - 5.1 mEq/L   Comment: MODERATE HEMOLYSIS     HEMOLYSIS AT THIS LEVEL MAY AFFECT RESULT  CREATININE, SERUM     Status: Abnormal   Collection Time    12/29/12  4:10 AM      Result Value Range   Creatinine, Ser 1.43 (*) 0.50 - 1.35 mg/dL   GFR calc non Af Amer 48 (*) >90 mL/min   GFR calc Af Amer 56 (*) >90 mL/min   Comment:            The eGFR has been calculated     using the CKD EPI equation.     This calculation has not been     validated in all clinical     situations.     eGFR's persistently     <90 mL/min signify     possible Chronic Kidney Disease.    Imaging / Studies: No results found.  Medications / Allergies: per chart  Antibiotics: Anti-infectives   Start     Dose/Rate Route Frequency Ordered Stop   12/27/12 2000  cefoTEtan (CEFOTAN) 2 g in dextrose 5 % 50 mL IVPB  2 g 100 mL/hr over 30 Minutes Intravenous Every  12 hours 12/27/12 1216 12/27/12 2042   12/27/12 0816  polymyxin 500,000 units/gentamicin 80 mg/0.9 % sodium chloride ophth. irrigation  Status:  Discontinued       As needed 12/27/12 0816 12/27/12 1100   12/27/12 0600  clindamycin (CLEOCIN) 900 mg, gentamicin (GARAMYCIN) 240 mg in sodium chloride 0.9 % 1,000 mL for intraperitoneal lavage  Status:  Discontinued      Intraperitoneal To Surgery 12/26/12 1944 12/27/12 1209   12/27/12 0516  cefoTEtan (CEFOTAN) 2 g in dextrose 5 % 50 mL IVPB     2 g 100 mL/hr over 30 Minutes Intravenous On call to O.R. 12/27/12 4098 12/27/12 1191

## 2012-12-30 LAB — CREATININE, SERUM: GFR calc Af Amer: 65 mL/min — ABNORMAL LOW (ref >90)

## 2012-12-30 LAB — HEMOGLOBIN: Hemoglobin: 8.6 g/dL — ABNORMAL LOW (ref 13.0–17.0)

## 2012-12-30 NOTE — Progress Notes (Signed)
Pt for d/c home today. IV(NSL) d/c'd. JP drain removed as ordered. ON Q pump removed by MD. Dressing applied to JP site. Discharge instructions & RX given with vebalized understanding. Wife at bedside to assist with d/c.

## 2012-12-30 NOTE — Progress Notes (Signed)
Spoke to Mr.Roseman concerning his DME order and he stated to me that he doesn't need a walker at this time.

## 2012-12-30 NOTE — Discharge Summary (Signed)
Physician Discharge Summary  Patient ID: Drew Adams MRN: [id number] DOB/AGE: 1941/03/17 72 y.o.  Admit date: 12/27/2012 Discharge date: 12/30/2012  Admission Diagnoses: Principal Problem:   Adenomatous polyp of sigmoid colon Active Problems:   Iron deficiency anemia   Diverticulosis of colon without hemorrhage   Gout   CKD (chronic kidney disease) stage 2, GFR 60-89 ml/min  Discharge Diagnoses:  Principal Problem:   Adenomatous polyp of sigmoid colon Active Problems:   Iron deficiency anemia   Diverticulosis of colon without hemorrhage   Gout   CKD (chronic kidney disease) stage 2, GFR 60-89 ml/min   Discharged Condition: good  Hospital Course: Patient with persistent anemia.  Large polyp out of the sigmoid colon presumed source.  He underwent a laparoscopic resection.  Postoperatively, the patient was placed on an anti-ileus protocol.  The patient mobilized and advanced to a solid diet gradually.  Pain was well-controlled and transitioned off IV medications.  His creatinine remained stable.  He did have some left knee swelling.  He was given an extra dose of prednisone and that helped calm things down.  Persistent mobilization helped work up the soreness as well.  By the time of discharge, the patient was walking well the hallways, eating food well, having flatus.  Pain was-controlled on an oral regimen.  Based on meeting DC criteria and recovering well, I felt it was safe for the patient to be discharged home with close followup.  Instructions were discussed in detail.  They are written as well.     Consults: None  Significant Diagnostic Studies:  Diagnosis Colon, segmental resection, with anastomotic rings - A LARGE TUBULAR ADENOMA, 3.2 CM, NO EVIDENCE OF HIGH GRADE DYSPLASIA OR MALIGNANCY. - ADJACENT DIVERTICULI. - INKED RESECTION MARGINS, NEGATIVE FOR ATYPIA OR MALIGNANCY. Abigail Miyamoto MD Pathologist, Electronic Signature (Case signed 12/28/2012) Specimen Yolander Goodie  and Clinical Information Specimen(s) Obtained: Colon, segmental resection, with anastomotic rings Specimen Clinical Information sigmoid colon polyp anemia (kp) Yahye Siebert Specimen: Received in formalin labeled rectosigmoid colon with anastomotic rings (open end proximal) Length: 30.2 cm Serosa: Tan pink with attached adipose tissue. The distal rectal portion of the specimen contains approximately the proximal 1/3 of the mesorectum, which is focally disrupted (digital photos are taken). Contents: The lumen contains a moderate amount of brown green fecal material, with multiple firm fecaliths. Mucosa/Wall: Tan pink with normal folding, and multiple diverticuli are identified throughout the entire specimen. Sectioning reveals that multiple diverticuli are impacted with brown green fecal material. There is a 3.2 x 2.6 x 1.8 cm tan red, nodular, pedunculated polyp, with a 6.6 cm tan mucosal stalk. The polyp attachment site measures 12.5 cm to the proximal margin and 18.4 cm to the distal resection margin. Sectioning the polyp reveals a tan red, friable cut surface. The wall measures 0.2 cm in thickness Lymph nodes: No enlarged lymph nodes are identified. Additional findings: Also received in the same container are two anastomotic rings, measuring 2.3 x 2.0 x 1.2 cm and 2.5 x 2.0 x 1.0 cm. The exposed mucosa is tan pink with focal areas of tan purple discoloration. The rings contain blue suture material and metallic staples. Block Summary: Sixteen blocks submitted A = proximal resection margin 1 of 2 FINAL for SHYHEIM, TANNEY ([id number]) Vickey Boak(continued) B = distal resection margin C-E = polyp attachment site F-L = polyp, entirely submitted M, N = diverticuli P = anastomotic rings. (KL:kh 12-27-12)   Treatments: surgery: lap assisted sigmoid colectomy  Discharge Exam: Blood pressure 122/66, pulse 80, temperature  98.2 F (36.8 C), temperature source Oral, resp. rate 18, height 5\' 10"  (1.778 m),  weight 191 lb 12.8 oz (87 kg), SpO2 98.00%.  General: Pt awake/alert/oriented x4 in no major acute distress Eyes: PERRL, normal EOM. Sclera nonicteric Neuro: CN II-XII intact w/o focal sensory/motor deficits. Lymph: No head/neck/groin lymphadenopathy Psych:  No delerium/psychosis/paranoia HENT: Normocephalic, Mucus membranes moist.  No thrush Neck: Supple, No tracheal deviation Chest: No pain.  Good respiratory excursion. CV:  Pulses intact.  Regular rhythm MS: Normal AROM mjr joints.  No obvious deformity Abdomen: Soft, Nondistended.  Min tender at incisions.  Dressings/wicks/OnQ removed.  No incarcerated hernias.  Drain serosanguinous Ext:  SCDs BLE.  No significant edema.  No cyanosis.  No asymmetrical swelling.  Mild left knee pain - improved Skin: No petechiae / purpura   Disposition: Final discharge disposition not confirmed  Discharge Orders   Future Orders Complete By Expires     Call MD for:  extreme fatigue  As directed     Call MD for:  hives  As directed     Call MD for:  persistant nausea and vomiting  As directed     Call MD for:  redness, tenderness, or signs of infection (pain, swelling, redness, odor or green/yellow discharge around incision site)  As directed     Call MD for:  severe uncontrolled pain  As directed     Call MD for:  As directed     Comments:      Temperature > 101.48F    Diet - low sodium heart healthy  As directed     Discharge instructions  As directed     Comments:      Please see discharge instruction sheets.  Also refer to handout given an office.  Please call our office if you have any questions or concerns [phone]    Discharge wound care:  As directed     Comments:      If you have closed incisions, shower and bathe over these incisions with soap and water every day.  Remove all surgical dressings on postoperative day #3.  You do not need to replace dressings over the closed incisions unless you feel more comfortable with a Band-Aid  covering it.   If you have an open wound that requires packing, please see wound care instructions.  In general, remove all dressings, wash wound with soap and water and then replace with saline moistened gauze.  Do the dressing change at least every day.  Please call our office [phone] if you have further questions.    Driving Restrictions  As directed     Comments:      No driving until off narcotics and can safely swerve away without pain during an emergency    Increase activity slowly  As directed     Comments:      Walk an hour a day.  Use 20-30 minute walks.  When you can walk 30 minutes without difficulty, increase to low impact/moderate activities such as biking, jogging, swimming, sexual activity..  Eventually can increase to unrestricted activity when not feeling pain.  If you feel pain: STOP!Marland Kitchen   Let pain protect you from overdoing it.  Use ice/heat/over-the-counter pain medications to help minimize his soreness.  Use pain prescriptions as needed to remain active.  It is better to take extra pain medications and be more active than to stay bedridden to avoid all pain medications.    Lifting restrictions  As directed  Comments:      Avoid heavy lifting initially.  Do not push through pain.  You have no specific weight limit.  Coughing and sneezing or four more stressful to your incision than any lifting you will do. Pain will protect you from injury.  Therefore, avoid intense activity until off all narcotic pain medications.  Coughing and sneezing or four more stressful to your incision than any lifting he will do.    May shower / Bathe  As directed     May walk up steps  As directed     Sexual Activity Restrictions  As directed     Comments:      Sexual activity as tolerated.  Do not push through pain.  Pain will protect you from injury.    Walk with assistance  As directed     Comments:      Walk over an hour a day.  May use a walker/cane/companion to help with balance and  stamina.        Medication List    STOP taking these medications       metroNIDAZOLE 500 MG tablet  Commonly known as:  FLAGYL     neomycin 500 MG tablet  Commonly known as:  MYCIFRADIN      TAKE these medications       allopurinol 300 MG tablet  Commonly known as:  ZYLOPRIM  Take 300 mg by mouth every morning.     calcium citrate 950 MG tablet  Commonly known as:  CALCITRATE - dosed in mg elemental calcium  Take 1 tablet by mouth daily.     ferrous fumarate 50 MG CR tablet  Commonly known as:  FERRO-SEQUELS  Take 324 mg by mouth 2 (two) times daily.     magnesium oxide 400 MG tablet  Commonly known as:  MAG-OX  Take 400 mg by mouth daily.     multivitamin with minerals Tabs  Take 1 tablet by mouth daily.     OVER THE COUNTER MEDICATION  1 tablet daily. Trunature Prostate Health     oxyCODONE 5 MG immediate release tablet  Commonly known as:  Oxy IR/ROXICODONE  Take 1-2 tablets (5-10 mg total) by mouth every 4 (four) hours as needed for pain.     predniSONE 5 MG tablet  Commonly known as:  DELTASONE  Take 5 mg by mouth daily as needed (for gout). Patient weaning off of 5 mg as of 12/21/12.     predniSONE 1 MG tablet  Commonly known as:  DELTASONE  Take 2 mg by mouth 2 (two) times daily.     vitamin C 1000 MG tablet  Take 500 mg by mouth daily.     Vitamin D3 10000 UNITS capsule  Take 10,000 Units by mouth daily.     zinc gluconate 50 MG tablet  Take 30 mg by mouth daily.           Follow-up Information   Follow up with Urho Rio,Davonta C., MD. Schedule an appointment as soon as possible for a visit in 2 weeks.   Contact information:   [address] [place] [address] [phone]       Signed: DELVON, CHIPPS. 12/30/2012, 8:07 AM

## 2013-01-02 ENCOUNTER — Telehealth (INDEPENDENT_AMBULATORY_CARE_PROVIDER_SITE_OTHER): Payer: Self-pay

## 2013-01-02 NOTE — Telephone Encounter (Signed)
The wife called reporting there is a stitch left in where the drain was pulled.  I said we can have him come in for a nurse visit.  The wife asked if she can remove it.  She does that at her job.  I told her that is fine.  Keep the area clean and dry and call with any further concerns.

## 2013-01-17 ENCOUNTER — Ambulatory Visit (INDEPENDENT_AMBULATORY_CARE_PROVIDER_SITE_OTHER): Payer: Medicare Other | Admitting: Surgery

## 2013-01-17 VITALS — BP 158/78 | HR 90 | Resp 18 | Ht 70.0 in | Wt 175.0 lb

## 2013-01-17 DIAGNOSIS — D126 Benign neoplasm of colon, unspecified: Secondary | ICD-10-CM

## 2013-01-17 NOTE — Progress Notes (Signed)
Subjective:     Patient ID: Drew Adams, male   DOB: [birth year], 72 y.o.   MRN: [id number]  HPI  Drew Adams  [birth year] [id number]  Patient Care Team: Marden Noble, MD as PCP - General (Internal Medicine) Barrie Folk, MD as Consulting Physician (Gastroenterology)  This patient is a 72 y.o.male who presents today for surgical evaluation 12/27/2012  POST-OPERATIVE DIAGNOSIS: sigmoid colon polyp with anemia   PROCEDURE: Procedure(s):  LAPAROSCOPIC PARTIAL COLECTOMY  RIGID PROCTOSCOPY   SURGEON: Surgeon(s):  Ardeth Sportsman, MD  ASSISTANT: Axel Filler, MD   Diagnosis Colon, segmental resection, with anastomotic rings - A LARGE TUBULAR ADENOMA, 3.2 CM, NO EVIDENCE OF HIGH GRADE DYSPLASIA OR MALIGNANCY. - ADJACENT DIVERTICULI. - INKED RESECTION MARGINS, NEGATIVE FOR ATYPIA OR MALIGNANCY. Abigail Miyamoto MD Pathologist, Electronic Signature (Case signed 12/28/2012)   The patient comes today with his wife.  Feeling better.  Walking well.  Has had a few loose bowel movements in the evenings.  Some urgency.  No blood.  Eating well.  Off pain medicines.  Energy level returning.  No fevers or chills or sweats.  Massaging the Pfannenstiel incision per his wife's instructions.  Patient Active Problem List   Diagnosis Date Noted  . CKD (chronic kidney disease) stage 2, GFR 60-89 ml/min 12/28/2012  . Adenomatous polyp of sigmoid colon 11/28/2012  . Iron deficiency anemia 11/28/2012  . Paraesophageal hiatal hernia 11/28/2012  . Diverticulosis of colon without hemorrhage 11/28/2012  . Gout     Past Medical History  Diagnosis Date  . Gout     uses prednisone 1-2x/week  . Iron deficiency anemia 11/28/2012  . Paraesophageal hiatal hernia 11/28/2012  . Diverticulosis of colon without hemorrhage 11/28/2012    Past Surgical History  Procedure Laterality Date  . Hernia repair Right 1970s    open RIH repair  . Tonsillectomy and adenoidectomy    . Cataract extraction Right 2012  .  Cataract extraction Left Jan 2014  . Laparoscopic sigmoid colectomy  12/27/2012  . Laparoscopic partial colectomy N/A 12/27/2012    Procedure: LAPAROSCOPIC  PARTIAL COLECTOMY RIGID PROCTOSCOPY ;  Surgeon: Ardeth Sportsman, MD;  Location: [facility];  Service: General;  Laterality: N/A;    History   Social History  . Marital Status: Married    Spouse Name: N/A    Number of Children: N/A  . Years of Education: N/A   Occupational History  . Not on file.   Social History Main Topics  . Smoking status: Never Smoker   . Smokeless tobacco: Never Used  . Alcohol Use: No  . Drug Use: No  . Sexually Active: Not on file   Other Topics Concern  . Not on file   Social History Narrative  . No narrative on file    Family History  Problem Relation Age of Onset  . Parkinson's disease Father   . Diabetes Father     Current Outpatient Prescriptions  Medication Sig Dispense Refill  . allopurinol (ZYLOPRIM) 300 MG tablet Take 300 mg by mouth every morning.       . Ascorbic Acid (VITAMIN C) 1000 MG tablet Take 500 mg by mouth daily.       . calcium citrate (CALCITRATE - DOSED IN MG ELEMENTAL CALCIUM) 950 MG tablet Take 1 tablet by mouth daily.      . Cholecalciferol (VITAMIN D3) 10000 UNITS capsule Take 10,000 Units by mouth daily.       . ferrous fumarate (FERRO-SEQUELS) 50 MG CR tablet  Take 324 mg by mouth 2 (two) times daily.       . magnesium oxide (MAG-OX) 400 MG tablet Take 400 mg by mouth daily.      . Multiple Vitamin (MULTIVITAMIN WITH MINERALS) TABS Take 1 tablet by mouth daily.      Marland Kitchen OVER THE COUNTER MEDICATION 1 tablet daily. Trunature Prostate Health      . oxyCODONE (OXY IR/ROXICODONE) 5 MG immediate release tablet Take 1-2 tablets (5-10 mg total) by mouth every 4 (four) hours as needed for pain.  50 tablet  0  . predniSONE (DELTASONE) 1 MG tablet Take 2 mg by mouth 2 (two) times daily.       . predniSONE (DELTASONE) 5 MG tablet Take 5 mg by mouth daily as needed (for gout). Patient  weaning off of 5 mg as of 12/21/12.      . zinc gluconate 50 MG tablet Take 30 mg by mouth daily.        No current facility-administered medications for this visit.     No Known Allergies  BP 158/78  Pulse 90  Resp 18  Ht 5\' 10"  (1.778 m)  Wt 175 lb (79.379 kg)  BMI 25.11 kg/m2  No results found.   Review of Systems  Constitutional: Negative for fever, chills and diaphoresis.  HENT: Negative for sore throat, trouble swallowing and neck pain.   Eyes: Negative for photophobia and visual disturbance.  Respiratory: Negative for choking and shortness of breath.   Cardiovascular: Negative for chest pain and palpitations.  Gastrointestinal: Positive for diarrhea. Negative for nausea, vomiting, abdominal pain, constipation, blood in stool, abdominal distention, anal bleeding and rectal pain.  Genitourinary: Negative for dysuria, urgency, difficulty urinating and testicular pain.  Musculoskeletal: Negative for myalgias, arthralgias and gait problem.  Skin: Negative for color change and rash.  Neurological: Negative for dizziness, speech difficulty, weakness and numbness.  Hematological: Negative for adenopathy.  Psychiatric/Behavioral: Negative for hallucinations, confusion and agitation.       Objective:   Physical Exam  Constitutional: He is oriented to person, place, and time. He appears well-developed and well-nourished. No distress.  HENT:  Head: Normocephalic.  Mouth/Throat: Oropharynx is clear and moist. No oropharyngeal exudate.  Eyes: Conjunctivae and EOM are normal. Pupils are equal, round, and reactive to light. No scleral icterus.  Neck: Normal range of motion. No tracheal deviation present.  Cardiovascular: Normal rate, normal heart sounds and intact distal pulses.   Pulmonary/Chest: Effort normal. No respiratory distress.  Abdominal: Soft. He exhibits no distension. There is no tenderness. Hernia confirmed negative in the right inguinal area and confirmed negative in  the left inguinal area.  Incisions clean with normal healing ridges.  No hernias  Musculoskeletal: Normal range of motion. He exhibits no tenderness.  Neurological: He is alert and oriented to person, place, and time. No cranial nerve deficit. He exhibits normal muscle tone. Coordination normal.  Skin: Skin is warm and dry. No rash noted. He is not diaphoretic.  Psychiatric: He has a normal mood and affect. His behavior is normal.       Assessment:     Almost 3 weeks out from colectomy for large sigmoid polyp.  Recovering relatively well except for some mild diarrhea/urgency     Plan:     Increase activity as tolerated to regular activity.  Low impact exercise such as walking an hour a day at least ideal.  Do not push through pain.  Diet as tolerated.  Low fat high fiber diet  ideal.  Bowel regimen with 30 g fiber a day and fiber supplement as needed to avoid problems.  Return to clinic as needed.   Instructions discussed.  Followup with primary care physician for other health issues as would normally be done.  Questions answered.  The patient expressed understanding and appreciation

## 2013-01-17 NOTE — Patient Instructions (Addendum)
ABDOMINAL SURGERY: POST OP INSTRUCTIONS  1. DIET: Follow a light bland diet the first 24 hours after arrival home, such as soup, liquids, crackers, etc.  Be sure to include lots of fluids daily.  Avoid fast food or heavy meals as your are more likely to get nauseated.  Eat a low fat the next few days after surgery.   2. Take your usually prescribed home medications unless otherwise directed. 3. PAIN CONTROL: a. Pain is best controlled by a usual combination of three different methods TOGETHER: i. Ice/Heat ii. Over the counter pain medication iii. Prescription pain medication b. Most patients will experience some swelling and bruising around the incisions.  Ice packs or heating pads (30-60 minutes up to 6 times a day) will help. Use ice for the first few days to help decrease swelling and bruising, then switch to heat to help relax tight/sore spots and speed recovery.  Some people prefer to use ice alone, heat alone, alternating between ice & heat.  Experiment to what works for you.  Swelling and bruising can take several weeks to resolve.   c. It is helpful to take an over-the-counter pain medication regularly for the first few weeks.  Choose one of the following that works best for you: i. Naproxen (Aleve, etc)  Two 262m tabs twice a day ii. Ibuprofen (Advil, etc) Three 2066mtabs four times a day (every meal & bedtime) iii. Acetaminophen (Tylenol, etc) 500-65011mour times a day (every meal & bedtime) d. A  prescription for pain medication (such as oxycodone, hydrocodone, etc) should be given to you upon discharge.  Take your pain medication as prescribed.  i. If you are having problems/concerns with the prescription medicine (does not control pain, nausea, vomiting, rash, itching, etc), please call us [phone] see if we need to switch you to a different pain medicine that will work better for you and/or control your side effect better. ii. If you need a refill on your pain medication,  please contact your pharmacy.  They will contact our office to request authorization. Prescriptions will not be filled after 5 pm or on week-ends. 4. Avoid getting constipated.  Between the surgery and the pain medications, it is common to experience some constipation.  Increasing fluid intake and taking a fiber supplement (such as Metamucil, Citrucel, FiberCon, MiraLax, etc) 1-2 times a day regularly will usually help prevent this problem from occurring.  A mild laxative (prune juice, Milk of Magnesia, MiraLax, etc) should be taken according to package directions if there are no bowel movements after 48 hours.   5. Watch out for diarrhea.  If you have many loose bowel movements, simplify your diet to bland foods & liquids for a few days.  Stop any stool softeners and decrease your fiber supplement.  Switching to mild anti-diarrheal medications (Kayopectate, Pepto Bismol) can help.  If this worsens or does not improve, please call us.[place]. Wash / shower every day.  You may shower over the incision / wound.  Avoid baths until the skin is fully healed.  Continue to shower over incision(s) after the dressing is off. 7. Remove your waterproof bandages 5 days after surgery.  You may leave the incision open to air.  You may replace a dressing/Band-Aid to cover the incision for comfort if you wish. 8. ACTIVITIES as tolerated:   a. You may resume regular (light) daily activities beginning the next day-such as daily self-care, walking, climbing stairs-gradually increasing activities as tolerated.  If you can  walk 30 minutes without difficulty, it is safe to try more intense activity such as jogging, treadmill, bicycling, low-impact aerobics, swimming, etc. b. Save the most intensive and strenuous activity for last such as sit-ups, heavy lifting, contact sports, etc  Refrain from any heavy lifting or straining until you are off narcotics for pain control.   c. DO NOT PUSH THROUGH PAIN.  Let pain be your guide: If it  hurts to do something, don't do it.  Pain is your body warning you to avoid that activity for another week until the pain goes down. d. You may drive when you are no longer taking prescription pain medication, you can comfortably wear a seatbelt, and you can safely maneuver your car and apply brakes. e. Dennis Bast may have sexual intercourse when it is comfortable.  9. FOLLOW UP in our office a. Please call [facility] at [phone] to set up an appointment to see your surgeon in the office for a follow-up appointment approximately 1-2 weeks after your surgery. b. Make sure that you call for this appointment the day you arrive home to insure a convenient appointment time. 10. IF YOU HAVE DISABILITY OR FAMILY LEAVE FORMS, BRING THEM TO THE OFFICE FOR PROCESSING.  DO NOT GIVE THEM TO YOUR DOCTOR.   WHEN TO CALL us [phone]: 1. Poor pain control 2. Reactions / problems with new medications (rash/itching, nausea, etc)  3. Fever over 101.5 F (38.5 C) 4. Inability to urinate 5. Nausea and/or vomiting 6. Worsening swelling or bruising 7. Continued bleeding from incision. 8. Increased pain, redness, or drainage from the incision  The clinic staff is available to answer your questions during regular business hours (8:30am-5pm).  Please don't hesitate to call and ask to speak to one of our nurses for clinical concerns.   A surgeon from [facility] is always on call at the hospitals   If you have a medical emergency, go to the nearest emergency room or call 911.    [facility], [address], [place], [place], [place]  [address] ? MAIN: [phone] ? TOLL FREE: [phone] ? FAX [phone] [email or web address]  GETTING TO GOOD BOWEL HEALTH. Irregular bowel habits such as constipation and diarrhea can lead to many problems over time.  Having one soft bowel movement a day is the most important way to prevent further problems.  The anorectal canal  is designed to handle stretching and feces to safely manage our ability to get rid of solid waste (feces, poop, stool) out of our body.  BUT, hard constipated stools can act like ripping concrete bricks and diarrhea can be a burning fire to this very sensitive area of our body, causing inflamed hemorrhoids, anal fissures, increasing risk is perirectal abscesses, abdominal pain/bloating, an making irritable bowel worse.     The goal: ONE SOFT BOWEL MOVEMENT A DAY!  To have soft, regular bowel movements:    Drink at least 8 tall glasses of water a day.     Take plenty of fiber.  Fiber is the undigested part of plant food that passes into the colon, acting s "natures broom" to encourage bowel motility and movement.  Fiber can absorb and hold large amounts of water. This results in a larger, bulkier stool, which is soft and easier to pass. Work gradually over several weeks up to 6 servings a day of fiber (25g a day even more if needed) in the form of: o Vegetables -- Root (potatoes, carrots, turnips),  leafy green (lettuce, salad greens, celery, spinach), or cooked high residue (cabbage, broccoli, etc) o Fruit -- Fresh (unpeeled skin & pulp), Dried (prunes, apricots, cherries, etc ),  or stewed ( applesauce)  o Whole grain breads, pasta, etc (whole wheat)  o Bran cereals    Bulking Agents -- This type of water-retaining fiber generally is easily obtained each day by one of the following:  o Psyllium bran -- The psyllium plant is remarkable because its ground seeds can retain so much water. This product is available as Metamucil, Konsyl, Effersyllium, Per Diem Fiber, or the less expensive generic preparation in drug and health food stores. Although labeled a laxative, it really is not a laxative.  o Methylcellulose -- This is another fiber derived from wood which also retains water. It is available as Citrucel. o Polyethylene Glycol - and "artificial" fiber commonly called Miralax or Glycolax.  It is helpful  for people with gassy or bloated feelings with regular fiber o Flax Seed - a less gassy fiber than psyllium   No reading or other relaxing activity while on the toilet. If bowel movements take longer than 5 minutes, you are too constipated   AVOID CONSTIPATION.  High fiber and water intake usually takes care of this.  Sometimes a laxative is needed to stimulate more frequent bowel movements, but    Laxatives are not a good long-term solution as it can wear the colon out. o Osmotics (Milk of Magnesia, Fleets phosphosoda, Magnesium citrate, MiraLax, GoLytely) are safer than  o Stimulants (Senokot, Castor Oil, Dulcolax, Ex Lax)    o Do not take laxatives for more than 7days in a row.    IF SEVERELY CONSTIPATED, try a Bowel Retraining Program: o Do not use laxatives.  o Eat a diet high in roughage, such as bran cereals and leafy vegetables.  o Drink six (6) ounces of prune or apricot juice each morning.  o Eat two (2) large servings of stewed fruit each day.  o Take one (1) heaping tablespoon of a psyllium-based bulking agent twice a day. Use sugar-free sweetener when possible to avoid excessive calories.  o Eat a normal breakfast.  o Set aside 15 minutes after breakfast to sit on the toilet, but do not strain to have a bowel movement.  o If you do not have a bowel movement by the third day, use an enema and repeat the above steps.    Controlling diarrhea o Switch to liquids and simpler foods for a few days to avoid stressing your intestines further. o Avoid dairy products (especially milk & ice cream) for a short time.  The intestines often can lose the ability to digest lactose when stressed. o Avoid foods that cause gassiness or bloating.  Typical foods include beans and other legumes, cabbage, broccoli, and dairy foods.  Every person has some sensitivity to other foods, so listen to our body and avoid those foods that trigger problems for you. o Adding fiber (Citrucel, Metamucil, psyllium,  Miralax) gradually can help thicken stools by absorbing excess fluid and retrain the intestines to act more normally.  Slowly increase the dose over a few weeks.  Too much fiber too soon can backfire and cause cramping & bloating. o Probiotics (such as active yogurt, Align, etc) may help repopulate the intestines and colon with normal bacteria and calm down a sensitive digestive tract.  Most studies show it to be of mild help, though, and such products can be costly. o Medicines:   Bismuth  subsalicylate (ex. Kayopectate, Pepto Bismol) every 30 minutes for up to 6 doses can help control diarrhea.  Avoid if pregnant.   Loperamide (Immodium) can slow down diarrhea.  Start with two tablets (4mg  total) first and then try one tablet every 6 hours.  Avoid if you are having fevers or severe pain.  If you are not better or start feeling worse, stop all medicines and call your doctor for advice o Call your doctor if you are getting worse or not better.  Sometimes further testing (cultures, endoscopy, X-ray studies, bloodwork, etc) may be needed to help diagnose and treat the cause of the diarrhea.  Colon Polyps Polyps are lumps of extra tissue growing inside the body. Polyps can grow in the large intestine (colon). Most colon polyps are noncancerous (benign). However, some colon polyps can become cancerous over time. Polyps that are larger than a pea may be harmful. To be safe, caregivers remove and test all polyps. CAUSES  Polyps form when mutations in the genes cause your cells to grow and divide even though no more tissue is needed. RISK FACTORS There are a number of risk factors that can increase your chances of getting colon polyps. They include:  Being older than 50 years.  Family history of colon polyps or colon cancer.  Long-term colon diseases, such as colitis or Crohn disease.  Being overweight.  Smoking.  Being inactive.  Drinking too much alcohol. SYMPTOMS  Most small polyps do not  cause symptoms. If symptoms are present, they may include:  Blood in the stool. The stool may look dark red or black.  Constipation or diarrhea that lasts longer than 1 week. DIAGNOSIS People often do not know they have polyps until their caregiver finds them during a regular checkup. Your caregiver can use 4 tests to check for polyps:  Digital rectal exam. The caregiver wears gloves and feels inside the rectum. This test would find polyps only in the rectum.  Barium enema. The caregiver puts a liquid called barium into your rectum before taking X-rays of your colon. Barium makes your colon look white. Polyps are dark, so they are easy to see in the X-ray pictures.  Sigmoidoscopy. A thin, flexible tube (sigmoidoscope) is placed into your rectum. The sigmoidoscope has a light and tiny camera in it. The caregiver uses the sigmoidoscope to look at the last third of your colon.  Colonoscopy. This test is like sigmoidoscopy, but the caregiver looks at the entire colon. This is the most common method for finding and removing polyps. TREATMENT  Any polyps will be removed during a sigmoidoscopy or colonoscopy. The polyps are then tested for cancer. PREVENTION  To help lower your risk of getting more colon polyps:  Eat plenty of fruits and vegetables. Avoid eating fatty foods.  Do not smoke.  Avoid drinking alcohol.  Exercise every day.  Lose weight if recommended by your caregiver.  Eat plenty of calcium and folate. Foods that are rich in calcium include milk, cheese, and broccoli. Foods that are rich in folate include chickpeas, kidney beans, and spinach. HOME CARE INSTRUCTIONS Keep all follow-up appointments as directed by your caregiver. You may need periodic exams to check for polyps. SEEK MEDICAL CARE IF: You notice bleeding during a bowel movement. Document Released: 04/08/2004 Document Revised: 10/05/2011 Document Reviewed: 09/22/2011 [facility] Patient Information 2014 [place],  [place].  Diverticulosis Diverticulosis is a common condition that develops when small pouches (diverticula) form in the wall of the colon. The risk of diverticulosis increases  with age. It happens more often in people who eat a low-fiber diet. Most individuals with diverticulosis have no symptoms. Those individuals with symptoms usually experience abdominal pain, constipation, or loose stools (diarrhea). HOME CARE INSTRUCTIONS   Increase the amount of fiber in your diet as directed by your caregiver or dietician. This may reduce symptoms of diverticulosis.  Your caregiver may recommend taking a dietary fiber supplement.  Drink at least 6 to 8 glasses of water each day to prevent constipation.  Try not to strain when you have a bowel movement.  Your caregiver may recommend avoiding nuts and seeds to prevent complications, although this is still an uncertain benefit.  Only take over-the-counter or prescription medicines for pain, discomfort, or fever as directed by your caregiver. FOODS WITH HIGH FIBER CONTENT INCLUDE:  Fruits. Apple, peach, pear, tangerine, raisins, prunes.  Vegetables. Brussels sprouts, asparagus, broccoli, cabbage, carrot, cauliflower, romaine lettuce, spinach, summer squash, tomato, winter squash, zucchini.  Starchy Vegetables. Baked beans, kidney beans, lima beans, split peas, lentils, potatoes (with skin).  Grains. Whole wheat bread, brown rice, bran flake cereal, plain oatmeal, white rice, shredded wheat, bran muffins. SEEK IMMEDIATE MEDICAL CARE IF:   You develop increasing pain or severe bloating.  You have an oral temperature above 102 F (38.9 C), not controlled by medicine.  You develop vomiting or bowel movements that are bloody or black. Document Released: 04/09/2004 Document Revised: 10/05/2011 Document Reviewed: 12/11/2009 [facility] Patient Information 2014 [place], [place].

## 2013-02-14 DIAGNOSIS — M109 Gout, unspecified: Secondary | ICD-10-CM | POA: Diagnosis not present

## 2013-02-14 DIAGNOSIS — D509 Iron deficiency anemia, unspecified: Secondary | ICD-10-CM | POA: Diagnosis not present

## 2013-02-14 DIAGNOSIS — K6389 Other specified diseases of intestine: Secondary | ICD-10-CM | POA: Diagnosis not present

## 2013-02-14 DIAGNOSIS — I10 Essential (primary) hypertension: Secondary | ICD-10-CM | POA: Diagnosis not present

## 2013-02-23 ENCOUNTER — Encounter (INDEPENDENT_AMBULATORY_CARE_PROVIDER_SITE_OTHER): Payer: Self-pay | Admitting: Surgery

## 2013-02-23 ENCOUNTER — Ambulatory Visit (INDEPENDENT_AMBULATORY_CARE_PROVIDER_SITE_OTHER): Payer: Medicare Other | Admitting: Surgery

## 2013-02-23 VITALS — BP 180/70 | HR 88 | Resp 14 | Ht 70.0 in | Wt 178.0 lb

## 2013-02-23 DIAGNOSIS — D126 Benign neoplasm of colon, unspecified: Secondary | ICD-10-CM

## 2013-02-23 DIAGNOSIS — K5730 Diverticulosis of large intestine without perforation or abscess without bleeding: Secondary | ICD-10-CM

## 2013-02-23 NOTE — Patient Instructions (Addendum)
GETTING TO GOOD BOWEL HEALTH. Irregular bowel habits such as constipation and diarrhea can lead to many problems over time.  Having one soft bowel movement a day is the most important way to prevent further problems.  The anorectal canal is designed to handle stretching and feces to safely manage our ability to get rid of solid waste (feces, poop, stool) out of our body.  BUT, hard constipated stools can act like ripping concrete bricks and diarrhea can be a burning fire to this very sensitive area of our body, causing inflamed hemorrhoids, anal fissures, increasing risk is perirectal abscesses, abdominal pain/bloating, an making irritable bowel worse.     The goal: ONE SOFT BOWEL MOVEMENT A DAY!  To have soft, regular bowel movements:    Drink at least 8 tall glasses of water a day.     Take plenty of fiber.  Fiber is the undigested part of plant food that passes into the colon, acting s "natures broom" to encourage bowel motility and movement.  Fiber can absorb and hold large amounts of water. This results in a larger, bulkier stool, which is soft and easier to pass. Work gradually over several weeks up to 6 servings a day of fiber (25g a day even more if needed) in the form of: o Vegetables -- Root (potatoes, carrots, turnips), leafy green (lettuce, salad greens, celery, spinach), or cooked high residue (cabbage, broccoli, etc) o Fruit -- Fresh (unpeeled skin & pulp), Dried (prunes, apricots, cherries, etc ),  or stewed ( applesauce)  o Whole grain breads, pasta, etc (whole wheat)  o Bran cereals    Bulking Agents -- This type of water-retaining fiber generally is easily obtained each day by one of the following:  o Psyllium bran -- The psyllium plant is remarkable because its ground seeds can retain so much water. This product is available as Metamucil, Konsyl, Effersyllium, Per Diem Fiber, or the less expensive generic preparation in drug and health food stores. Although labeled a laxative, it really  is not a laxative.  o Methylcellulose -- This is another fiber derived from wood which also retains water. It is available as Citrucel. o Polyethylene Glycol - and "artificial" fiber commonly called Miralax or Glycolax.  It is helpful for people with gassy or bloated feelings with regular fiber o Flax Seed - a less gassy fiber than psyllium   No reading or other relaxing activity while on the toilet. If bowel movements take longer than 5 minutes, you are too constipated   AVOID CONSTIPATION.  High fiber and water intake usually takes care of this.  Sometimes a laxative is needed to stimulate more frequent bowel movements, but    Laxatives are not a good long-term solution as it can wear the colon out. o Osmotics (Milk of Magnesia, Fleets phosphosoda, Magnesium citrate, MiraLax, GoLytely) are safer than  o Stimulants (Senokot, Castor Oil, Dulcolax, Ex Lax)    o Do not take laxatives for more than 7days in a row.    IF SEVERELY CONSTIPATED, try a Bowel Retraining Program: o Do not use laxatives.  o Eat a diet high in roughage, such as bran cereals and leafy vegetables.  o Drink six (6) ounces of prune or apricot juice each morning.  o Eat two (2) large servings of stewed fruit each day.  o Take one (1) heaping tablespoon of a psyllium-based bulking agent twice a day. Use sugar-free sweetener when possible to avoid excessive calories.  o Eat a normal breakfast.  o   Set aside 15 minutes after breakfast to sit on the toilet, but do not strain to have a bowel movement.  o If you do not have a bowel movement by the third day, use an enema and repeat the above steps.    Controlling diarrhea o Switch to liquids and simpler foods for a few days to avoid stressing your intestines further. o Avoid dairy products (especially milk & ice cream) for a short time.  The intestines often can lose the ability to digest lactose when stressed. o Avoid foods that cause gassiness or bloating.  Typical foods include  beans and other legumes, cabbage, broccoli, and dairy foods.  Every person has some sensitivity to other foods, so listen to our body and avoid those foods that trigger problems for you. o Adding fiber (Citrucel, Metamucil, psyllium, Miralax) gradually can help thicken stools by absorbing excess fluid and retrain the intestines to act more normally.  Slowly increase the dose over a few weeks.  Too much fiber too soon can backfire and cause cramping & bloating. o Probiotics (such as active yogurt, Align, etc) may help repopulate the intestines and colon with normal bacteria and calm down a sensitive digestive tract.  Most studies show it to be of mild help, though, and such products can be costly. o Medicines:   Bismuth subsalicylate (ex. Kayopectate, Pepto Bismol) every 30 minutes for up to 6 doses can help control diarrhea.  Avoid if pregnant.   Loperamide (Immodium) can slow down diarrhea.  Start with two tablets (4mg total) first and then try one tablet every 6 hours.  Avoid if you are having fevers or severe pain.  If you are not better or start feeling worse, stop all medicines and call your doctor for advice o Call your doctor if you are getting worse or not better.  Sometimes further testing (cultures, endoscopy, X-ray studies, bloodwork, etc) may be needed to help diagnose and treat the cause of the diarrhea.  Colon Polyps Polyps are lumps of extra tissue growing inside the body. Polyps can grow in the large intestine (colon). Most colon polyps are noncancerous (benign). However, some colon polyps can become cancerous over time. Polyps that are larger than a pea may be harmful. To be safe, caregivers remove and test all polyps. CAUSES  Polyps form when mutations in the genes cause your cells to grow and divide even though no more tissue is needed. RISK FACTORS There are a number of risk factors that can increase your chances of getting colon polyps. They include:  Being older than 50  years.  Family history of colon polyps or colon cancer.  Long-term colon diseases, such as colitis or Crohn disease.  Being overweight.  Smoking.  Being inactive.  Drinking too much alcohol. SYMPTOMS  Most small polyps do not cause symptoms. If symptoms are present, they may include:  Blood in the stool. The stool may look dark red or black.  Constipation or diarrhea that lasts longer than 1 week. DIAGNOSIS People often do not know they have polyps until their caregiver finds them during a regular checkup. Your caregiver can use 4 tests to check for polyps:  Digital rectal exam. The caregiver wears gloves and feels inside the rectum. This test would find polyps only in the rectum.  Barium enema. The caregiver puts a liquid called barium into your rectum before taking X-rays of your colon. Barium makes your colon look white. Polyps are dark, so they are easy to see in the X-ray   pictures.  Sigmoidoscopy. A thin, flexible tube (sigmoidoscope) is placed into your rectum. The sigmoidoscope has a light and tiny camera in it. The caregiver uses the sigmoidoscope to look at the last third of your colon.  Colonoscopy. This test is like sigmoidoscopy, but the caregiver looks at the entire colon. This is the most common method for finding and removing polyps. TREATMENT  Any polyps will be removed during a sigmoidoscopy or colonoscopy. The polyps are then tested for cancer. PREVENTION  To help lower your risk of getting more colon polyps:  Eat plenty of fruits and vegetables. Avoid eating fatty foods.  Do not smoke.  Avoid drinking alcohol.  Exercise every day.  Lose weight if recommended by your caregiver.  Eat plenty of calcium and folate. Foods that are rich in calcium include milk, cheese, and broccoli. Foods that are rich in folate include chickpeas, kidney beans, and spinach. HOME CARE INSTRUCTIONS Keep all follow-up appointments as directed by your caregiver. You may need  periodic exams to check for polyps. SEEK MEDICAL CARE IF: You notice bleeding during a bowel movement. Document Released: 04/08/2004 Document Revised: 10/05/2011 Document Reviewed: 09/22/2011 [employer] Patient Information 2014 [employer].    

## 2013-02-23 NOTE — Progress Notes (Signed)
Subjective:     Patient ID: Drew Adams, male   DOB: [birth year], 72 y.o.   MRN: [id number]  HPI   Drew Adams  01/02/1941 [id number]  Patient Care Team: Marden Noble, MD as PCP - General (Internal Medicine) Barrie Folk, MD as Consulting Physician (Gastroenterology)  This patient is a 72 y.o.male who presents today for surgical evaluation 12/27/2012  POST-OPERATIVE DIAGNOSIS: sigmoid colon polyp with anemia   PROCEDURE: Procedure(s):  LAPAROSCOPIC PARTIAL COLECTOMY  RIGID PROCTOSCOPY   SURGEON: Surgeon(s):  Ardeth Sportsman, MD  ASSISTANT: Axel Filler, MD   Diagnosis Colon, segmental resection, with anastomotic rings - A LARGE TUBULAR ADENOMA, 3.2 CM, NO EVIDENCE OF HIGH GRADE DYSPLASIA OR MALIGNANCY. - ADJACENT DIVERTICULI. - INKED RESECTION MARGINS, NEGATIVE FOR ATYPIA OR MALIGNANCY. Abigail Miyamoto MD Pathologist, Electronic Signature (Case signed 12/28/2012)   The patient comes today feeling better.  Walking well.  Eating well.  Off pain medicines.  Energy level nearly normal.  No fevers or chills or sweats.   Using Metamucil and having more well formed bowels.  Weaning off the iron as his anemia is nearly resolved.  Patient Active Problem List   Diagnosis Date Noted  . CKD (chronic kidney disease) stage 2, GFR 60-89 ml/min 12/28/2012  . Adenomatous polyp of sigmoid colon 11/28/2012  . Iron deficiency anemia 11/28/2012  . Paraesophageal hiatal hernia 11/28/2012  . Diverticulosis of colon without hemorrhage 11/28/2012  . Gout     Past Medical History  Diagnosis Date  . Gout     uses prednisone 1-2x/week  . Iron deficiency anemia 11/28/2012  . Paraesophageal hiatal hernia 11/28/2012  . Diverticulosis of colon without hemorrhage 11/28/2012    Past Surgical History  Procedure Laterality Date  . Hernia repair Right 1970s    open RIH repair  . Tonsillectomy and adenoidectomy    . Cataract extraction Right 2012  . Cataract extraction Left Jan 2014  .  Laparoscopic sigmoid colectomy  12/27/2012  . Laparoscopic partial colectomy N/A 12/27/2012    Procedure: LAPAROSCOPIC  PARTIAL COLECTOMY RIGID PROCTOSCOPY ;  Surgeon: Ardeth Sportsman, MD;  Location: [facility];  Service: General;  Laterality: N/A;    History   Social History  . Marital Status: Married    Spouse Name: N/A    Number of Children: N/A  . Years of Education: N/A   Occupational History  . Not on file.   Social History Main Topics  . Smoking status: Never Smoker   . Smokeless tobacco: Never Used  . Alcohol Use: No  . Drug Use: No  . Sexually Active: Not on file   Other Topics Concern  . Not on file   Social History Narrative  . No narrative on file    Family History  Problem Relation Age of Onset  . Parkinson's disease Father   . Diabetes Father     Current Outpatient Prescriptions  Medication Sig Dispense Refill  . allopurinol (ZYLOPRIM) 300 MG tablet Take 300 mg by mouth every morning.       . Ascorbic Acid (VITAMIN C) 1000 MG tablet Take 500 mg by mouth daily.       . calcium citrate (CALCITRATE - DOSED IN MG ELEMENTAL CALCIUM) 950 MG tablet Take 1 tablet by mouth daily.      . Cholecalciferol (VITAMIN D3) 10000 UNITS capsule Take 10,000 Units by mouth daily.       . ferrous fumarate (FERRO-SEQUELS) 50 MG CR tablet Take 324 mg by mouth 2 (two)  times daily.       . magnesium oxide (MAG-OX) 400 MG tablet Take 400 mg by mouth daily.      . Multiple Vitamin (MULTIVITAMIN WITH MINERALS) TABS Take 1 tablet by mouth daily.      Marland Kitchen OVER THE COUNTER MEDICATION 1 tablet daily. [facility]      . predniSONE (DELTASONE) 1 MG tablet Take 2 mg by mouth 2 (two) times daily.       . predniSONE (DELTASONE) 5 MG tablet Take 5 mg by mouth daily as needed (for gout). Patient weaning off of 5 mg as of 12/21/12.      . zinc gluconate 50 MG tablet Take 30 mg by mouth daily.       Marland Kitchen oxyCODONE (OXY IR/ROXICODONE) 5 MG immediate release tablet Take 1-2 tablets (5-10 mg total)  by mouth every 4 (four) hours as needed for pain.  50 tablet  0   No current facility-administered medications for this visit.     No Known Allergies  BP 180/70  Pulse 88  Resp 14  Ht 5\' 10"  (1.778 m)  Wt 178 lb (80.74 kg)  BMI 25.54 kg/m2  No results found.   Review of Systems  Constitutional: Negative for fever, chills and diaphoresis.  HENT: Negative for sore throat, trouble swallowing and neck pain.   Eyes: Negative for photophobia and visual disturbance.  Respiratory: Negative for choking and shortness of breath.   Cardiovascular: Negative for chest pain and palpitations.  Gastrointestinal: Positive for diarrhea. Negative for nausea, vomiting, abdominal pain, constipation, blood in stool, abdominal distention, anal bleeding and rectal pain.  Genitourinary: Negative for dysuria, urgency, difficulty urinating and testicular pain.  Musculoskeletal: Negative for myalgias, arthralgias and gait problem.  Skin: Negative for color change and rash.  Neurological: Negative for dizziness, speech difficulty, weakness and numbness.  Hematological: Negative for adenopathy.  Psychiatric/Behavioral: Negative for hallucinations, confusion and agitation.       Objective:   Physical Exam  Constitutional: He is oriented to person, place, and time. He appears well-developed and well-nourished. No distress.  HENT:  Head: Normocephalic.  Mouth/Throat: Oropharynx is clear and moist. No oropharyngeal exudate.  Eyes: Conjunctivae and EOM are normal. Pupils are equal, round, and reactive to light. No scleral icterus.  Neck: Normal range of motion. No tracheal deviation present.  Cardiovascular: Normal rate, normal heart sounds and intact distal pulses.   Pulmonary/Chest: Effort normal. No respiratory distress.  Abdominal: Soft. He exhibits no distension. There is no tenderness. Hernia confirmed negative in the right inguinal area and confirmed negative in the left inguinal area.  Incisions  clean with normal healing ridges.  No hernias  Musculoskeletal: Normal range of motion. He exhibits no tenderness.  Neurological: He is alert and oriented to person, place, and time. No cranial nerve deficit. He exhibits normal muscle tone. Coordination normal.  Skin: Skin is warm and dry. No rash noted. He is not diaphoretic.  Psychiatric: He has a normal mood and affect. His behavior is normal.       Assessment:     6 weeks out from colectomy for large sigmoid polyp.  Recovering well.    Plan:     Increase activity as tolerated to regular activity.  Low impact exercise such as walking an hour a day at least ideal.  Do not push through pain.  Diet as tolerated.  Low fat high fiber diet ideal.  Bowel regimen with 30 g fiber a day and fiber supplement as needed  to avoid problems.  Return to clinic as needed.   Instructions discussed.  Followup with primary care physician for other health issues as would normally be done.  Questions answered.  The patient expressed understanding and appreciation

## 2013-03-01 ENCOUNTER — Other Ambulatory Visit: Payer: Self-pay

## 2013-03-15 DIAGNOSIS — M19049 Primary osteoarthritis, unspecified hand: Secondary | ICD-10-CM | POA: Diagnosis not present

## 2013-03-15 DIAGNOSIS — M2010 Hallux valgus (acquired), unspecified foot: Secondary | ICD-10-CM | POA: Diagnosis not present

## 2013-03-15 DIAGNOSIS — M064 Inflammatory polyarthropathy: Secondary | ICD-10-CM | POA: Diagnosis not present

## 2013-03-15 DIAGNOSIS — M1A00X1 Idiopathic chronic gout, unspecified site, with tophus (tophi): Secondary | ICD-10-CM | POA: Diagnosis not present

## 2013-03-15 DIAGNOSIS — M2550 Pain in unspecified joint: Secondary | ICD-10-CM | POA: Diagnosis not present

## 2013-03-15 DIAGNOSIS — M1990 Unspecified osteoarthritis, unspecified site: Secondary | ICD-10-CM | POA: Diagnosis not present

## 2013-04-18 DIAGNOSIS — M2550 Pain in unspecified joint: Secondary | ICD-10-CM | POA: Diagnosis not present

## 2013-04-18 DIAGNOSIS — Z79899 Other long term (current) drug therapy: Secondary | ICD-10-CM | POA: Diagnosis not present

## 2013-04-18 DIAGNOSIS — M1A00X1 Idiopathic chronic gout, unspecified site, with tophus (tophi): Secondary | ICD-10-CM | POA: Diagnosis not present

## 2013-04-18 DIAGNOSIS — M1990 Unspecified osteoarthritis, unspecified site: Secondary | ICD-10-CM | POA: Diagnosis not present

## 2013-04-26 DIAGNOSIS — H26499 Other secondary cataract, unspecified eye: Secondary | ICD-10-CM | POA: Diagnosis not present

## 2013-04-26 DIAGNOSIS — H04129 Dry eye syndrome of unspecified lacrimal gland: Secondary | ICD-10-CM | POA: Diagnosis not present

## 2013-04-26 DIAGNOSIS — Z961 Presence of intraocular lens: Secondary | ICD-10-CM | POA: Diagnosis not present

## 2013-05-16 DIAGNOSIS — Z79899 Other long term (current) drug therapy: Secondary | ICD-10-CM | POA: Diagnosis not present

## 2013-06-01 ENCOUNTER — Other Ambulatory Visit: Payer: Self-pay

## 2013-06-06 DIAGNOSIS — M1A00X1 Idiopathic chronic gout, unspecified site, with tophus (tophi): Secondary | ICD-10-CM | POA: Diagnosis not present

## 2013-06-06 DIAGNOSIS — R7989 Other specified abnormal findings of blood chemistry: Secondary | ICD-10-CM | POA: Diagnosis not present

## 2013-06-27 DIAGNOSIS — M1A00X1 Idiopathic chronic gout, unspecified site, with tophus (tophi): Secondary | ICD-10-CM | POA: Diagnosis not present

## 2013-06-27 DIAGNOSIS — M1990 Unspecified osteoarthritis, unspecified site: Secondary | ICD-10-CM | POA: Diagnosis not present

## 2013-06-27 DIAGNOSIS — Z79899 Other long term (current) drug therapy: Secondary | ICD-10-CM | POA: Diagnosis not present

## 2013-06-27 DIAGNOSIS — M2550 Pain in unspecified joint: Secondary | ICD-10-CM | POA: Diagnosis not present

## 2013-08-24 DIAGNOSIS — H26499 Other secondary cataract, unspecified eye: Secondary | ICD-10-CM | POA: Diagnosis not present

## 2013-09-14 DIAGNOSIS — H26499 Other secondary cataract, unspecified eye: Secondary | ICD-10-CM | POA: Diagnosis not present

## 2013-09-26 DIAGNOSIS — Z79899 Other long term (current) drug therapy: Secondary | ICD-10-CM | POA: Diagnosis not present

## 2013-09-26 DIAGNOSIS — M1A00X1 Idiopathic chronic gout, unspecified site, with tophus (tophi): Secondary | ICD-10-CM | POA: Diagnosis not present

## 2013-09-26 DIAGNOSIS — M1990 Unspecified osteoarthritis, unspecified site: Secondary | ICD-10-CM | POA: Diagnosis not present

## 2013-09-26 DIAGNOSIS — M2550 Pain in unspecified joint: Secondary | ICD-10-CM | POA: Diagnosis not present

## 2013-10-05 DIAGNOSIS — H26499 Other secondary cataract, unspecified eye: Secondary | ICD-10-CM | POA: Diagnosis not present

## 2013-10-18 DIAGNOSIS — Z0000 Encounter for general adult medical examination without abnormal findings: Secondary | ICD-10-CM | POA: Diagnosis not present

## 2013-10-18 DIAGNOSIS — I10 Essential (primary) hypertension: Secondary | ICD-10-CM | POA: Diagnosis not present

## 2013-10-18 DIAGNOSIS — Z1331 Encounter for screening for depression: Secondary | ICD-10-CM | POA: Diagnosis not present

## 2013-10-18 DIAGNOSIS — M109 Gout, unspecified: Secondary | ICD-10-CM | POA: Diagnosis not present

## 2013-10-18 DIAGNOSIS — R011 Cardiac murmur, unspecified: Secondary | ICD-10-CM | POA: Diagnosis not present

## 2013-11-06 ENCOUNTER — Ambulatory Visit (HOSPITAL_COMMUNITY): Payer: Medicare Other | Attending: Cardiovascular Disease | Admitting: Cardiology

## 2013-11-06 ENCOUNTER — Other Ambulatory Visit (HOSPITAL_COMMUNITY): Payer: Self-pay | Admitting: Internal Medicine

## 2013-11-06 DIAGNOSIS — R011 Cardiac murmur, unspecified: Secondary | ICD-10-CM

## 2013-11-06 NOTE — Progress Notes (Signed)
Echo performed. 

## 2014-01-24 DIAGNOSIS — I517 Cardiomegaly: Secondary | ICD-10-CM | POA: Diagnosis not present

## 2014-01-24 DIAGNOSIS — R011 Cardiac murmur, unspecified: Secondary | ICD-10-CM | POA: Diagnosis not present

## 2014-01-24 DIAGNOSIS — I10 Essential (primary) hypertension: Secondary | ICD-10-CM | POA: Diagnosis not present

## 2014-01-24 DIAGNOSIS — Z23 Encounter for immunization: Secondary | ICD-10-CM | POA: Diagnosis not present

## 2014-01-29 DIAGNOSIS — M1A00X1 Idiopathic chronic gout, unspecified site, with tophus (tophi): Secondary | ICD-10-CM | POA: Diagnosis not present

## 2014-01-29 DIAGNOSIS — M2550 Pain in unspecified joint: Secondary | ICD-10-CM | POA: Diagnosis not present

## 2014-01-29 DIAGNOSIS — Z79899 Other long term (current) drug therapy: Secondary | ICD-10-CM | POA: Diagnosis not present

## 2014-03-28 DIAGNOSIS — L821 Other seborrheic keratosis: Secondary | ICD-10-CM | POA: Diagnosis not present

## 2014-03-28 DIAGNOSIS — D235 Other benign neoplasm of skin of trunk: Secondary | ICD-10-CM | POA: Diagnosis not present

## 2014-04-27 DIAGNOSIS — H6120 Impacted cerumen, unspecified ear: Secondary | ICD-10-CM | POA: Diagnosis not present

## 2014-04-27 DIAGNOSIS — H6693 Otitis media, unspecified, bilateral: Secondary | ICD-10-CM | POA: Diagnosis not present

## 2014-05-01 DIAGNOSIS — I10 Essential (primary) hypertension: Secondary | ICD-10-CM | POA: Diagnosis not present

## 2014-05-01 DIAGNOSIS — E782 Mixed hyperlipidemia: Secondary | ICD-10-CM | POA: Diagnosis not present

## 2014-05-01 DIAGNOSIS — M109 Gout, unspecified: Secondary | ICD-10-CM | POA: Diagnosis not present

## 2014-05-03 DIAGNOSIS — H04123 Dry eye syndrome of bilateral lacrimal glands: Secondary | ICD-10-CM | POA: Diagnosis not present

## 2014-05-31 DIAGNOSIS — M2550 Pain in unspecified joint: Secondary | ICD-10-CM | POA: Diagnosis not present

## 2014-05-31 DIAGNOSIS — Z79899 Other long term (current) drug therapy: Secondary | ICD-10-CM | POA: Diagnosis not present

## 2014-05-31 DIAGNOSIS — M159 Polyosteoarthritis, unspecified: Secondary | ICD-10-CM | POA: Diagnosis not present

## 2014-05-31 DIAGNOSIS — M1A09X1 Idiopathic chronic gout, multiple sites, with tophus (tophi): Secondary | ICD-10-CM | POA: Diagnosis not present

## 2014-11-27 DIAGNOSIS — Z79899 Other long term (current) drug therapy: Secondary | ICD-10-CM | POA: Diagnosis not present

## 2014-11-27 DIAGNOSIS — M150 Primary generalized (osteo)arthritis: Secondary | ICD-10-CM | POA: Diagnosis not present

## 2014-11-27 DIAGNOSIS — M2550 Pain in unspecified joint: Secondary | ICD-10-CM | POA: Diagnosis not present

## 2014-11-27 DIAGNOSIS — M1A09X1 Idiopathic chronic gout, multiple sites, with tophus (tophi): Secondary | ICD-10-CM | POA: Diagnosis not present

## 2015-03-13 DIAGNOSIS — D509 Iron deficiency anemia, unspecified: Secondary | ICD-10-CM | POA: Diagnosis not present

## 2015-03-13 DIAGNOSIS — Z23 Encounter for immunization: Secondary | ICD-10-CM | POA: Diagnosis not present

## 2015-03-13 DIAGNOSIS — R972 Elevated prostate specific antigen [PSA]: Secondary | ICD-10-CM | POA: Diagnosis not present

## 2015-03-13 DIAGNOSIS — R011 Cardiac murmur, unspecified: Secondary | ICD-10-CM | POA: Diagnosis not present

## 2015-03-13 DIAGNOSIS — Z0001 Encounter for general adult medical examination with abnormal findings: Secondary | ICD-10-CM | POA: Diagnosis not present

## 2015-03-13 DIAGNOSIS — N529 Male erectile dysfunction, unspecified: Secondary | ICD-10-CM | POA: Diagnosis not present

## 2015-03-13 DIAGNOSIS — I10 Essential (primary) hypertension: Secondary | ICD-10-CM | POA: Diagnosis not present

## 2015-03-13 DIAGNOSIS — I517 Cardiomegaly: Secondary | ICD-10-CM | POA: Diagnosis not present

## 2015-03-13 DIAGNOSIS — E782 Mixed hyperlipidemia: Secondary | ICD-10-CM | POA: Diagnosis not present

## 2015-03-13 DIAGNOSIS — Z1389 Encounter for screening for other disorder: Secondary | ICD-10-CM | POA: Diagnosis not present

## 2015-03-13 DIAGNOSIS — M109 Gout, unspecified: Secondary | ICD-10-CM | POA: Diagnosis not present

## 2015-05-14 ENCOUNTER — Other Ambulatory Visit (HOSPITAL_COMMUNITY): Payer: Self-pay | Admitting: Urology

## 2015-05-14 DIAGNOSIS — R972 Elevated prostate specific antigen [PSA]: Secondary | ICD-10-CM

## 2015-05-14 DIAGNOSIS — N401 Enlarged prostate with lower urinary tract symptoms: Secondary | ICD-10-CM | POA: Diagnosis not present

## 2015-05-31 ENCOUNTER — Ambulatory Visit (HOSPITAL_COMMUNITY)
Admission: RE | Admit: 2015-05-31 | Discharge: 2015-05-31 | Disposition: A | Discharge status: Home / Self Care | Payer: Medicare Other | Source: Ambulatory Visit | Attending: Urology | Admitting: Urology

## 2015-05-31 DIAGNOSIS — N402 Nodular prostate without lower urinary tract symptoms: Secondary | ICD-10-CM | POA: Diagnosis not present

## 2015-05-31 DIAGNOSIS — R972 Elevated prostate specific antigen [PSA]: Secondary | ICD-10-CM

## 2015-05-31 LAB — POCT I-STAT CREATININE: Creatinine, Ser: 1.8 mg/dL — ABNORMAL HIGH (ref 0.61–1.24)

## 2015-05-31 MED ORDER — GADOBENATE DIMEGLUMINE 529 MG/ML IV SOLN
10.0000 mL | Freq: Once | INTRAVENOUS | Status: AC | PRN
  Administered 2015-05-31: 8 mL via INTRAVENOUS

## 2015-06-07 DIAGNOSIS — M1A9XX1 Chronic gout, unspecified, with tophus (tophi): Secondary | ICD-10-CM | POA: Diagnosis not present

## 2015-06-07 DIAGNOSIS — Z79899 Other long term (current) drug therapy: Secondary | ICD-10-CM | POA: Diagnosis not present

## 2015-06-07 DIAGNOSIS — M2550 Pain in unspecified joint: Secondary | ICD-10-CM | POA: Diagnosis not present

## 2015-06-13 DIAGNOSIS — N138 Other obstructive and reflux uropathy: Secondary | ICD-10-CM | POA: Diagnosis not present

## 2015-06-13 DIAGNOSIS — R972 Elevated prostate specific antigen [PSA]: Secondary | ICD-10-CM | POA: Diagnosis not present

## 2015-06-13 DIAGNOSIS — N401 Enlarged prostate with lower urinary tract symptoms: Secondary | ICD-10-CM | POA: Diagnosis not present

## 2015-07-04 DIAGNOSIS — H04123 Dry eye syndrome of bilateral lacrimal glands: Secondary | ICD-10-CM | POA: Diagnosis not present

## 2015-07-04 DIAGNOSIS — H1132 Conjunctival hemorrhage, left eye: Secondary | ICD-10-CM | POA: Diagnosis not present

## 2015-07-04 DIAGNOSIS — H524 Presbyopia: Secondary | ICD-10-CM | POA: Diagnosis not present

## 2015-07-08 DIAGNOSIS — R972 Elevated prostate specific antigen [PSA]: Secondary | ICD-10-CM | POA: Diagnosis not present

## 2015-07-17 DIAGNOSIS — R972 Elevated prostate specific antigen [PSA]: Secondary | ICD-10-CM | POA: Diagnosis not present

## 2015-07-17 DIAGNOSIS — N138 Other obstructive and reflux uropathy: Secondary | ICD-10-CM | POA: Diagnosis not present

## 2015-07-17 DIAGNOSIS — N401 Enlarged prostate with lower urinary tract symptoms: Secondary | ICD-10-CM | POA: Diagnosis not present

## 2015-12-19 DIAGNOSIS — M1A9XX1 Chronic gout, unspecified, with tophus (tophi): Secondary | ICD-10-CM | POA: Diagnosis not present

## 2015-12-19 DIAGNOSIS — Z79899 Other long term (current) drug therapy: Secondary | ICD-10-CM | POA: Diagnosis not present

## 2015-12-19 DIAGNOSIS — M2550 Pain in unspecified joint: Secondary | ICD-10-CM | POA: Diagnosis not present

## 2016-01-08 DIAGNOSIS — N401 Enlarged prostate with lower urinary tract symptoms: Secondary | ICD-10-CM | POA: Diagnosis not present

## 2016-01-15 DIAGNOSIS — N401 Enlarged prostate with lower urinary tract symptoms: Secondary | ICD-10-CM | POA: Diagnosis not present

## 2016-01-15 DIAGNOSIS — R351 Nocturia: Secondary | ICD-10-CM | POA: Diagnosis not present

## 2016-01-15 DIAGNOSIS — R972 Elevated prostate specific antigen [PSA]: Secondary | ICD-10-CM | POA: Diagnosis not present

## 2016-04-25 DIAGNOSIS — H53132 Sudden visual loss, left eye: Secondary | ICD-10-CM | POA: Diagnosis not present

## 2016-04-25 DIAGNOSIS — H3562 Retinal hemorrhage, left eye: Secondary | ICD-10-CM | POA: Diagnosis not present

## 2016-04-25 DIAGNOSIS — H53412 Scotoma involving central area, left eye: Secondary | ICD-10-CM | POA: Diagnosis not present

## 2016-04-25 DIAGNOSIS — H10012 Acute follicular conjunctivitis, left eye: Secondary | ICD-10-CM | POA: Diagnosis not present

## 2016-04-25 DIAGNOSIS — H3582 Retinal ischemia: Secondary | ICD-10-CM | POA: Diagnosis not present

## 2016-04-25 DIAGNOSIS — H47092 Other disorders of optic nerve, not elsewhere classified, left eye: Secondary | ICD-10-CM | POA: Diagnosis not present

## 2016-04-29 DIAGNOSIS — H3562 Retinal hemorrhage, left eye: Secondary | ICD-10-CM | POA: Diagnosis not present

## 2016-04-29 DIAGNOSIS — H3582 Retinal ischemia: Secondary | ICD-10-CM | POA: Diagnosis not present

## 2016-06-02 DIAGNOSIS — Z961 Presence of intraocular lens: Secondary | ICD-10-CM | POA: Diagnosis not present

## 2016-06-02 DIAGNOSIS — H3562 Retinal hemorrhage, left eye: Secondary | ICD-10-CM | POA: Diagnosis not present

## 2016-06-02 DIAGNOSIS — H3122 Choroidal dystrophy (central areolar) (generalized) (peripapillary): Secondary | ICD-10-CM | POA: Diagnosis not present

## 2016-06-02 DIAGNOSIS — H353131 Nonexudative age-related macular degeneration, bilateral, early dry stage: Secondary | ICD-10-CM | POA: Diagnosis not present

## 2016-06-02 DIAGNOSIS — H53412 Scotoma involving central area, left eye: Secondary | ICD-10-CM | POA: Diagnosis not present

## 2016-06-02 DIAGNOSIS — H35363 Drusen (degenerative) of macula, bilateral: Secondary | ICD-10-CM | POA: Diagnosis not present

## 2016-06-02 DIAGNOSIS — H35453 Secondary pigmentary degeneration, bilateral: Secondary | ICD-10-CM | POA: Diagnosis not present

## 2016-06-16 DIAGNOSIS — Z862 Personal history of diseases of the blood and blood-forming organs and certain disorders involving the immune mechanism: Secondary | ICD-10-CM | POA: Diagnosis not present

## 2016-06-16 DIAGNOSIS — Z1389 Encounter for screening for other disorder: Secondary | ICD-10-CM | POA: Diagnosis not present

## 2016-06-16 DIAGNOSIS — I517 Cardiomegaly: Secondary | ICD-10-CM | POA: Diagnosis not present

## 2016-06-16 DIAGNOSIS — R011 Cardiac murmur, unspecified: Secondary | ICD-10-CM | POA: Diagnosis not present

## 2016-06-16 DIAGNOSIS — R972 Elevated prostate specific antigen [PSA]: Secondary | ICD-10-CM | POA: Diagnosis not present

## 2016-06-16 DIAGNOSIS — N529 Male erectile dysfunction, unspecified: Secondary | ICD-10-CM | POA: Diagnosis not present

## 2016-06-16 DIAGNOSIS — E782 Mixed hyperlipidemia: Secondary | ICD-10-CM | POA: Diagnosis not present

## 2016-06-16 DIAGNOSIS — D509 Iron deficiency anemia, unspecified: Secondary | ICD-10-CM | POA: Diagnosis not present

## 2016-06-16 DIAGNOSIS — N200 Calculus of kidney: Secondary | ICD-10-CM | POA: Diagnosis not present

## 2016-06-16 DIAGNOSIS — M109 Gout, unspecified: Secondary | ICD-10-CM | POA: Diagnosis not present

## 2016-06-16 DIAGNOSIS — Z0000 Encounter for general adult medical examination without abnormal findings: Secondary | ICD-10-CM | POA: Diagnosis not present

## 2016-06-16 DIAGNOSIS — I10 Essential (primary) hypertension: Secondary | ICD-10-CM | POA: Diagnosis not present

## 2016-06-26 DIAGNOSIS — E663 Overweight: Secondary | ICD-10-CM | POA: Diagnosis not present

## 2016-06-26 DIAGNOSIS — Z6826 Body mass index (BMI) 26.0-26.9, adult: Secondary | ICD-10-CM | POA: Diagnosis not present

## 2016-06-26 DIAGNOSIS — M2550 Pain in unspecified joint: Secondary | ICD-10-CM | POA: Diagnosis not present

## 2016-06-26 DIAGNOSIS — Z79899 Other long term (current) drug therapy: Secondary | ICD-10-CM | POA: Diagnosis not present

## 2016-06-26 DIAGNOSIS — M1A9XX1 Chronic gout, unspecified, with tophus (tophi): Secondary | ICD-10-CM | POA: Diagnosis not present

## 2016-07-09 DIAGNOSIS — Z961 Presence of intraocular lens: Secondary | ICD-10-CM | POA: Diagnosis not present

## 2016-07-09 DIAGNOSIS — H04123 Dry eye syndrome of bilateral lacrimal glands: Secondary | ICD-10-CM | POA: Diagnosis not present

## 2016-08-11 DIAGNOSIS — R972 Elevated prostate specific antigen [PSA]: Secondary | ICD-10-CM | POA: Diagnosis not present

## 2016-08-26 DIAGNOSIS — R351 Nocturia: Secondary | ICD-10-CM | POA: Diagnosis not present

## 2016-08-26 DIAGNOSIS — R972 Elevated prostate specific antigen [PSA]: Secondary | ICD-10-CM | POA: Diagnosis not present

## 2016-08-26 DIAGNOSIS — N401 Enlarged prostate with lower urinary tract symptoms: Secondary | ICD-10-CM | POA: Diagnosis not present

## 2016-09-02 DIAGNOSIS — R972 Elevated prostate specific antigen [PSA]: Secondary | ICD-10-CM | POA: Diagnosis not present

## 2017-01-08 DIAGNOSIS — Z6827 Body mass index (BMI) 27.0-27.9, adult: Secondary | ICD-10-CM | POA: Diagnosis not present

## 2017-01-08 DIAGNOSIS — E663 Overweight: Secondary | ICD-10-CM | POA: Diagnosis not present

## 2017-01-08 DIAGNOSIS — M2550 Pain in unspecified joint: Secondary | ICD-10-CM | POA: Diagnosis not present

## 2017-01-08 DIAGNOSIS — M1A9XX1 Chronic gout, unspecified, with tophus (tophi): Secondary | ICD-10-CM | POA: Diagnosis not present

## 2017-01-08 DIAGNOSIS — Z79899 Other long term (current) drug therapy: Secondary | ICD-10-CM | POA: Diagnosis not present

## 2017-01-13 DIAGNOSIS — H3122 Choroidal dystrophy (central areolar) (generalized) (peripapillary): Secondary | ICD-10-CM | POA: Diagnosis not present

## 2017-01-13 DIAGNOSIS — H43812 Vitreous degeneration, left eye: Secondary | ICD-10-CM | POA: Diagnosis not present

## 2017-01-13 DIAGNOSIS — H353131 Nonexudative age-related macular degeneration, bilateral, early dry stage: Secondary | ICD-10-CM | POA: Diagnosis not present

## 2017-01-13 DIAGNOSIS — H47092 Other disorders of optic nerve, not elsewhere classified, left eye: Secondary | ICD-10-CM | POA: Diagnosis not present

## 2017-01-13 DIAGNOSIS — Z961 Presence of intraocular lens: Secondary | ICD-10-CM | POA: Diagnosis not present

## 2017-01-13 DIAGNOSIS — H35453 Secondary pigmentary degeneration, bilateral: Secondary | ICD-10-CM | POA: Diagnosis not present

## 2017-01-13 DIAGNOSIS — H10012 Acute follicular conjunctivitis, left eye: Secondary | ICD-10-CM | POA: Diagnosis not present

## 2017-01-13 DIAGNOSIS — H53412 Scotoma involving central area, left eye: Secondary | ICD-10-CM | POA: Diagnosis not present

## 2017-01-13 DIAGNOSIS — H3582 Retinal ischemia: Secondary | ICD-10-CM | POA: Diagnosis not present

## 2017-01-15 DIAGNOSIS — R945 Abnormal results of liver function studies: Secondary | ICD-10-CM | POA: Diagnosis not present

## 2017-02-25 DIAGNOSIS — Z961 Presence of intraocular lens: Secondary | ICD-10-CM | POA: Diagnosis not present

## 2017-02-25 DIAGNOSIS — H04123 Dry eye syndrome of bilateral lacrimal glands: Secondary | ICD-10-CM | POA: Diagnosis not present

## 2017-02-25 DIAGNOSIS — H16042 Marginal corneal ulcer, left eye: Secondary | ICD-10-CM | POA: Diagnosis not present

## 2017-03-01 DIAGNOSIS — H16042 Marginal corneal ulcer, left eye: Secondary | ICD-10-CM | POA: Diagnosis not present

## 2017-03-01 DIAGNOSIS — Z961 Presence of intraocular lens: Secondary | ICD-10-CM | POA: Diagnosis not present

## 2017-03-01 DIAGNOSIS — H04123 Dry eye syndrome of bilateral lacrimal glands: Secondary | ICD-10-CM | POA: Diagnosis not present

## 2017-03-04 DIAGNOSIS — H01002 Unspecified blepharitis right lower eyelid: Secondary | ICD-10-CM | POA: Diagnosis not present

## 2017-03-04 DIAGNOSIS — H01001 Unspecified blepharitis right upper eyelid: Secondary | ICD-10-CM | POA: Diagnosis not present

## 2017-03-04 DIAGNOSIS — H16042 Marginal corneal ulcer, left eye: Secondary | ICD-10-CM | POA: Diagnosis not present

## 2017-03-04 DIAGNOSIS — H01004 Unspecified blepharitis left upper eyelid: Secondary | ICD-10-CM | POA: Diagnosis not present

## 2017-03-04 DIAGNOSIS — Z961 Presence of intraocular lens: Secondary | ICD-10-CM | POA: Diagnosis not present

## 2017-03-04 DIAGNOSIS — H04123 Dry eye syndrome of bilateral lacrimal glands: Secondary | ICD-10-CM | POA: Diagnosis not present

## 2017-03-04 DIAGNOSIS — H01005 Unspecified blepharitis left lower eyelid: Secondary | ICD-10-CM | POA: Diagnosis not present

## 2017-03-17 DIAGNOSIS — N401 Enlarged prostate with lower urinary tract symptoms: Secondary | ICD-10-CM | POA: Diagnosis not present

## 2017-03-24 DIAGNOSIS — R351 Nocturia: Secondary | ICD-10-CM | POA: Diagnosis not present

## 2017-03-24 DIAGNOSIS — N401 Enlarged prostate with lower urinary tract symptoms: Secondary | ICD-10-CM | POA: Diagnosis not present

## 2017-03-24 DIAGNOSIS — R972 Elevated prostate specific antigen [PSA]: Secondary | ICD-10-CM | POA: Diagnosis not present

## 2017-03-24 DIAGNOSIS — N5201 Erectile dysfunction due to arterial insufficiency: Secondary | ICD-10-CM | POA: Diagnosis not present

## 2017-03-25 DIAGNOSIS — H01001 Unspecified blepharitis right upper eyelid: Secondary | ICD-10-CM | POA: Diagnosis not present

## 2017-03-25 DIAGNOSIS — H01004 Unspecified blepharitis left upper eyelid: Secondary | ICD-10-CM | POA: Diagnosis not present

## 2017-03-25 DIAGNOSIS — H01002 Unspecified blepharitis right lower eyelid: Secondary | ICD-10-CM | POA: Diagnosis not present

## 2017-03-25 DIAGNOSIS — H04123 Dry eye syndrome of bilateral lacrimal glands: Secondary | ICD-10-CM | POA: Diagnosis not present

## 2017-03-25 DIAGNOSIS — H01005 Unspecified blepharitis left lower eyelid: Secondary | ICD-10-CM | POA: Diagnosis not present

## 2017-03-25 DIAGNOSIS — H16042 Marginal corneal ulcer, left eye: Secondary | ICD-10-CM | POA: Diagnosis not present

## 2017-06-30 DIAGNOSIS — M109 Gout, unspecified: Secondary | ICD-10-CM | POA: Diagnosis not present

## 2017-06-30 DIAGNOSIS — N529 Male erectile dysfunction, unspecified: Secondary | ICD-10-CM | POA: Diagnosis not present

## 2017-06-30 DIAGNOSIS — E782 Mixed hyperlipidemia: Secondary | ICD-10-CM | POA: Diagnosis not present

## 2017-06-30 DIAGNOSIS — I517 Cardiomegaly: Secondary | ICD-10-CM | POA: Diagnosis not present

## 2017-06-30 DIAGNOSIS — D509 Iron deficiency anemia, unspecified: Secondary | ICD-10-CM | POA: Diagnosis not present

## 2017-06-30 DIAGNOSIS — I10 Essential (primary) hypertension: Secondary | ICD-10-CM | POA: Diagnosis not present

## 2017-06-30 DIAGNOSIS — R972 Elevated prostate specific antigen [PSA]: Secondary | ICD-10-CM | POA: Diagnosis not present

## 2017-06-30 DIAGNOSIS — Z0000 Encounter for general adult medical examination without abnormal findings: Secondary | ICD-10-CM | POA: Diagnosis not present

## 2017-06-30 DIAGNOSIS — Z1389 Encounter for screening for other disorder: Secondary | ICD-10-CM | POA: Diagnosis not present

## 2017-06-30 DIAGNOSIS — R011 Cardiac murmur, unspecified: Secondary | ICD-10-CM | POA: Diagnosis not present

## 2017-06-30 DIAGNOSIS — Z79899 Other long term (current) drug therapy: Secondary | ICD-10-CM | POA: Diagnosis not present

## 2017-07-09 DIAGNOSIS — Z6828 Body mass index (BMI) 28.0-28.9, adult: Secondary | ICD-10-CM | POA: Diagnosis not present

## 2017-07-09 DIAGNOSIS — M2550 Pain in unspecified joint: Secondary | ICD-10-CM | POA: Diagnosis not present

## 2017-07-09 DIAGNOSIS — M1A9XX1 Chronic gout, unspecified, with tophus (tophi): Secondary | ICD-10-CM | POA: Diagnosis not present

## 2017-07-09 DIAGNOSIS — E663 Overweight: Secondary | ICD-10-CM | POA: Diagnosis not present

## 2017-07-09 DIAGNOSIS — Z79899 Other long term (current) drug therapy: Secondary | ICD-10-CM | POA: Diagnosis not present

## 2017-07-15 DIAGNOSIS — H01001 Unspecified blepharitis right upper eyelid: Secondary | ICD-10-CM | POA: Diagnosis not present

## 2017-07-15 DIAGNOSIS — H01004 Unspecified blepharitis left upper eyelid: Secondary | ICD-10-CM | POA: Diagnosis not present

## 2017-07-15 DIAGNOSIS — H01005 Unspecified blepharitis left lower eyelid: Secondary | ICD-10-CM | POA: Diagnosis not present

## 2017-07-15 DIAGNOSIS — H01002 Unspecified blepharitis right lower eyelid: Secondary | ICD-10-CM | POA: Diagnosis not present

## 2017-07-15 DIAGNOSIS — Z961 Presence of intraocular lens: Secondary | ICD-10-CM | POA: Diagnosis not present

## 2017-08-23 DIAGNOSIS — H01005 Unspecified blepharitis left lower eyelid: Secondary | ICD-10-CM | POA: Diagnosis not present

## 2017-08-23 DIAGNOSIS — Z961 Presence of intraocular lens: Secondary | ICD-10-CM | POA: Diagnosis not present

## 2017-08-23 DIAGNOSIS — H01001 Unspecified blepharitis right upper eyelid: Secondary | ICD-10-CM | POA: Diagnosis not present

## 2017-08-23 DIAGNOSIS — H01002 Unspecified blepharitis right lower eyelid: Secondary | ICD-10-CM | POA: Diagnosis not present

## 2017-08-23 DIAGNOSIS — H10013 Acute follicular conjunctivitis, bilateral: Secondary | ICD-10-CM | POA: Diagnosis not present

## 2017-08-23 DIAGNOSIS — H01004 Unspecified blepharitis left upper eyelid: Secondary | ICD-10-CM | POA: Diagnosis not present

## 2018-01-07 DIAGNOSIS — M2550 Pain in unspecified joint: Secondary | ICD-10-CM | POA: Diagnosis not present

## 2018-01-07 DIAGNOSIS — Z6825 Body mass index (BMI) 25.0-25.9, adult: Secondary | ICD-10-CM | POA: Diagnosis not present

## 2018-01-07 DIAGNOSIS — E663 Overweight: Secondary | ICD-10-CM | POA: Diagnosis not present

## 2018-01-07 DIAGNOSIS — M1A9XX1 Chronic gout, unspecified, with tophus (tophi): Secondary | ICD-10-CM | POA: Diagnosis not present

## 2018-01-07 DIAGNOSIS — Z79899 Other long term (current) drug therapy: Secondary | ICD-10-CM | POA: Diagnosis not present

## 2018-02-11 DIAGNOSIS — Z79899 Other long term (current) drug therapy: Secondary | ICD-10-CM | POA: Diagnosis not present

## 2018-03-18 DIAGNOSIS — R972 Elevated prostate specific antigen [PSA]: Secondary | ICD-10-CM | POA: Diagnosis not present

## 2018-03-23 DIAGNOSIS — R351 Nocturia: Secondary | ICD-10-CM | POA: Diagnosis not present

## 2018-03-23 DIAGNOSIS — N5201 Erectile dysfunction due to arterial insufficiency: Secondary | ICD-10-CM | POA: Diagnosis not present

## 2018-03-23 DIAGNOSIS — N401 Enlarged prostate with lower urinary tract symptoms: Secondary | ICD-10-CM | POA: Diagnosis not present

## 2018-03-23 DIAGNOSIS — R972 Elevated prostate specific antigen [PSA]: Secondary | ICD-10-CM | POA: Diagnosis not present

## 2018-07-15 DIAGNOSIS — Z79899 Other long term (current) drug therapy: Secondary | ICD-10-CM | POA: Diagnosis not present

## 2018-07-15 DIAGNOSIS — M2550 Pain in unspecified joint: Secondary | ICD-10-CM | POA: Diagnosis not present

## 2018-07-15 DIAGNOSIS — M1A9XX1 Chronic gout, unspecified, with tophus (tophi): Secondary | ICD-10-CM | POA: Diagnosis not present

## 2018-07-15 DIAGNOSIS — E663 Overweight: Secondary | ICD-10-CM | POA: Diagnosis not present

## 2018-07-15 DIAGNOSIS — I10 Essential (primary) hypertension: Secondary | ICD-10-CM | POA: Diagnosis not present

## 2018-07-15 DIAGNOSIS — Z6826 Body mass index (BMI) 26.0-26.9, adult: Secondary | ICD-10-CM | POA: Diagnosis not present

## 2018-08-04 DIAGNOSIS — H01002 Unspecified blepharitis right lower eyelid: Secondary | ICD-10-CM | POA: Diagnosis not present

## 2018-08-04 DIAGNOSIS — H01001 Unspecified blepharitis right upper eyelid: Secondary | ICD-10-CM | POA: Diagnosis not present

## 2018-08-04 DIAGNOSIS — H01004 Unspecified blepharitis left upper eyelid: Secondary | ICD-10-CM | POA: Diagnosis not present

## 2018-08-04 DIAGNOSIS — Z961 Presence of intraocular lens: Secondary | ICD-10-CM | POA: Diagnosis not present

## 2018-09-02 ENCOUNTER — Other Ambulatory Visit: Payer: Self-pay | Admitting: Internal Medicine

## 2018-09-02 ENCOUNTER — Ambulatory Visit
Admission: RE | Admit: 2018-09-02 | Discharge: 2018-09-02 | Disposition: A | Discharge status: Home / Self Care | Payer: Medicare Other | Source: Ambulatory Visit | Attending: Internal Medicine | Admitting: Internal Medicine

## 2018-09-02 DIAGNOSIS — R200 Anesthesia of skin: Secondary | ICD-10-CM

## 2018-09-02 DIAGNOSIS — M21331 Wrist drop, right wrist: Secondary | ICD-10-CM | POA: Diagnosis not present

## 2018-09-08 DIAGNOSIS — G5600 Carpal tunnel syndrome, unspecified upper limb: Secondary | ICD-10-CM | POA: Diagnosis not present

## 2018-09-08 DIAGNOSIS — G629 Polyneuropathy, unspecified: Secondary | ICD-10-CM | POA: Diagnosis not present

## 2018-09-15 DIAGNOSIS — I517 Cardiomegaly: Secondary | ICD-10-CM | POA: Diagnosis not present

## 2018-09-15 DIAGNOSIS — G5620 Lesion of ulnar nerve, unspecified upper limb: Secondary | ICD-10-CM | POA: Diagnosis not present

## 2018-09-15 DIAGNOSIS — Z1389 Encounter for screening for other disorder: Secondary | ICD-10-CM | POA: Diagnosis not present

## 2018-09-15 DIAGNOSIS — D509 Iron deficiency anemia, unspecified: Secondary | ICD-10-CM | POA: Diagnosis not present

## 2018-09-15 DIAGNOSIS — M109 Gout, unspecified: Secondary | ICD-10-CM | POA: Diagnosis not present

## 2018-09-15 DIAGNOSIS — Z0001 Encounter for general adult medical examination with abnormal findings: Secondary | ICD-10-CM | POA: Diagnosis not present

## 2018-09-15 DIAGNOSIS — E782 Mixed hyperlipidemia: Secondary | ICD-10-CM | POA: Diagnosis not present

## 2018-09-15 DIAGNOSIS — I10 Essential (primary) hypertension: Secondary | ICD-10-CM | POA: Diagnosis not present

## 2018-09-15 DIAGNOSIS — N529 Male erectile dysfunction, unspecified: Secondary | ICD-10-CM | POA: Diagnosis not present

## 2018-09-15 DIAGNOSIS — R011 Cardiac murmur, unspecified: Secondary | ICD-10-CM | POA: Diagnosis not present

## 2018-09-15 DIAGNOSIS — G43109 Migraine with aura, not intractable, without status migrainosus: Secondary | ICD-10-CM | POA: Diagnosis not present

## 2018-10-10 ENCOUNTER — Ambulatory Visit: Payer: Self-pay | Admitting: Neurology

## 2018-10-11 ENCOUNTER — Telehealth: Payer: Self-pay | Admitting: Neurology

## 2018-10-11 ENCOUNTER — Encounter: Payer: Self-pay | Admitting: Neurology

## 2018-10-11 ENCOUNTER — Other Ambulatory Visit: Payer: Self-pay

## 2018-10-11 ENCOUNTER — Ambulatory Visit (INDEPENDENT_AMBULATORY_CARE_PROVIDER_SITE_OTHER): Payer: Medicare Other | Admitting: Neurology

## 2018-10-11 VITALS — BP 157/81 | HR 79 | Ht 70.5 in | Wt 189.0 lb

## 2018-10-11 DIAGNOSIS — G459 Transient cerebral ischemic attack, unspecified: Secondary | ICD-10-CM

## 2018-10-11 NOTE — Telephone Encounter (Signed)
Medicare/[employer] supp order sent to GI. No auth they will reach out to the pt to schedule.

## 2018-10-11 NOTE — Progress Notes (Signed)
Reason for visit: Right arm weakness  Referring physician: Dr. Johnney Killian is a 78 y.o. male  History of present illness:  [name] is a 78 year old right-handed white male with a history of gout who comes to the office today for evaluation of an episode of right hand and arm weakness.  The patient had taken a shower on the morning of 02 September 2018, he felt well at that point, but then suddenly he noted onset of weakness involving the right hand.  The patient had inability to extend the fingers, he had a wrist drop.  He also felt some numbness in the hand and numbness on the right side of the face.  He denied any vision changes, headache, slurred speech, difficulty swallowing, or dizziness.  He did not have any change in balance or difficulty controlling the bowels or the bladder.  He reported no weakness of the legs on either side.  He has never had an event like this previously.  The patient has not fully recovered from this event, he did undergo a CT scan of the brain which was unremarkable, he underwent EMG and nerve conduction study which revealed evidence of an ulnar neuropathy on the right.  He comes to this office for an evaluation.  Past Medical History:  Diagnosis Date  . Diverticulosis of colon without hemorrhage 11/28/2012  . Gout    uses prednisone 1-2x/week  . Iron deficiency anemia 11/28/2012  . Paraesophageal hiatal hernia 11/28/2012    Past Surgical History:  Procedure Laterality Date  . CATARACT EXTRACTION Right 2012  . CATARACT EXTRACTION Left Jan 2014  . HERNIA REPAIR Right 1970s   open RIH repair  . LAPAROSCOPIC PARTIAL COLECTOMY N/A 12/27/2012   Procedure: LAPAROSCOPIC  PARTIAL COLECTOMY RIGID PROCTOSCOPY ;  Surgeon: Adin Hector, MD;  Location: [facility];  Service: General;  Laterality: N/A;  . LAPAROSCOPIC SIGMOID COLECTOMY  12/27/2012  . TONSILLECTOMY AND ADENOIDECTOMY      Family History  Problem Relation Age of Onset  . Parkinson's disease Father    . Diabetes Father     Social history:  reports that he has never smoked. He has never used smokeless tobacco. He reports that he does not drink alcohol or use drugs.  Medications:  Prior to Admission medications   Medication Sig Start Date End Date Taking? Authorizing Provider  allopurinol (ZYLOPRIM) 300 MG tablet Take 300 mg by mouth every morning.  11/15/12  Yes [provider]  Ascorbic Acid (VITAMIN C) 1000 MG tablet Take 500 mg by mouth daily.    Yes [provider]  aspirin 325 MG tablet Take 325 mg by mouth daily.   Yes [provider]  calcium citrate (CALCITRATE - DOSED IN MG ELEMENTAL CALCIUM) 950 MG tablet Take 1 tablet by mouth daily.   Yes [provider]  Cholecalciferol (VITAMIN D3) 10000 UNITS capsule Take 10,000 Units by mouth daily.    Yes [provider]  ferrous fumarate (FERRO-SEQUELS) 50 MG CR tablet Take 324 mg by mouth 2 (two) times daily.    Yes [provider]  magnesium oxide (MAG-OX) 400 MG tablet Take 400 mg by mouth daily.   Yes [provider]  Multiple Vitamin (MULTIVITAMIN WITH MINERALS) TABS Take 1 tablet by mouth daily.   Yes [provider]  OVER THE COUNTER MEDICATION 1 tablet daily. [place]   Yes [provider]  oxyCODONE (OXY IR/ROXICODONE) 5 MG immediate release tablet Take 1-2  tablets (5-10 mg total) by mouth every 4 (four) hours as needed for pain. 12/27/12  Yes Michael Boston, MD  predniSONE (DELTASONE) 1 MG tablet Take 2 mg by mouth 2 (two) times daily.    Yes [provider]  predniSONE (DELTASONE) 5 MG tablet Take 5 mg by mouth daily as needed (for gout). Patient weaning off of 5 mg as of 12/21/12. 10/19/12  Yes [provider]  zinc gluconate 50 MG tablet Take 30 mg by mouth daily.    Yes [provider]     No Known Allergies  ROS:  Out of a complete 14 system review of symptoms, the patient complains only of the following  symptoms, and all other reviewed systems are negative.  Weakness of the hand  Height 5' 10.5" (1.791 m), weight 189 lb (85.7 kg).  Physical Exam  General: The patient is alert and cooperative at the time of the examination.  Eyes: Pupils are equal, round, and reactive to light. Discs are flat bilaterally.  Neck: The neck is supple, no carotid bruits are noted.  Respiratory: The respiratory examination is clear.  Cardiovascular: The cardiovascular examination reveals a regular rate and rhythm, no obvious murmurs or rubs are noted.  Skin: Extremities are without significant edema.  Neurologic Exam  Mental status: The patient is alert and oriented x 3 at the time of the examination. The patient has apparent normal recent and remote memory, with an apparently normal attention span and concentration ability.  Cranial nerves: Facial symmetry is present. There is good sensation of the face to pinprick and soft touch bilaterally. The strength of the facial muscles and the muscles to head turning and shoulder shrug are normal bilaterally. Speech is well enunciated, no aphasia or dysarthria is noted. Extraocular movements are full. Visual fields are full. The tongue is midline, and the patient has symmetric elevation of the soft palate. No obvious hearing deficits are noted.  Motor: The motor testing reveals 5 over 5 strength of all 4 extremities, with exception of some slight weakness with abduction of the fingers of the right hand, no weakness with the distal flexors of the right hand, some weakness with extension of the fourth and fifth fingers. Good symmetric motor tone is noted throughout.  Sensory: Sensory testing is intact to pinprick, soft touch, vibration sensation, and position sense on all 4 extremities. No evidence of extinction is noted.  Coordination: Cerebellar testing reveals good finger-nose-finger and heel-to-shin bilaterally.  Gait and station: Gait is normal. Tandem gait is  normal. Romberg is negative. No drift is seen.  Reflexes: Deep tendon reflexes are symmetric and normal bilaterally. Toes are downgoing bilaterally.   CT head 09/02/18:  IMPRESSION: 1. No acute intracranial abnormalities. 2. Atrophy, most probably involving the parietal lobes.  * CT scan images were reviewed online. I agree with the written report.    Assessment/Plan:  1.  Right hand weakness, right facial numbness, possible stroke or TIA  2.  Right ulnar neuropathy by EMG and nerve conduction study  The history of an ulnar neuropathy does not explain the deficit that is described by the patient with numbness on the right face and a wrist drop and difficulty extending the fingers on the right.  The patient may have sustained a small stroke event that was missed by CT scan of the brain.  He will be sent for MRI of the brain, he will undergo a carotid Doppler study, he will start low-dose aspirin.  The patient will follow-up  in 4 months.  If another event of weakness or numbness occurs, the patient is to call 911 and go to the emergency room.  Jill Alexanders MD 10/11/2018 8:19 AM  [facility] [address] [place], [place] [address]  Phone [phone] Fax [phone]

## 2018-10-14 ENCOUNTER — Telehealth: Payer: Self-pay | Admitting: Neurology

## 2018-10-14 ENCOUNTER — Ambulatory Visit
Admission: RE | Admit: 2018-10-14 | Discharge: 2018-10-14 | Disposition: A | Discharge status: Home / Self Care | Payer: Medicare Other | Source: Ambulatory Visit | Attending: Neurology | Admitting: Neurology

## 2018-10-14 ENCOUNTER — Other Ambulatory Visit: Payer: Self-pay

## 2018-10-14 DIAGNOSIS — G459 Transient cerebral ischemic attack, unspecified: Secondary | ICD-10-CM

## 2018-10-14 NOTE — Telephone Encounter (Signed)
  I called the patient.  The MRI of the brain shows minimal small vessel disease, no evidence of an overt stroke.  Carotid Doppler studies pending, I will contact him when I get this result.   MRI brain 10/14/18:  IMPRESSION: This MRI of the brain without contrast shows the following: 1.    There is minimal chronic microvascular ischemic change.  None of these appear to be acute. 2.    Atrophy only noted in the posterior frontal and the parietal lobes. 3.    There are no acute findings.

## 2018-10-18 ENCOUNTER — Ambulatory Visit (HOSPITAL_COMMUNITY)
Admission: RE | Admit: 2018-10-18 | Discharge: 2018-10-18 | Disposition: A | Discharge status: Home / Self Care | Payer: Medicare Other | Source: Ambulatory Visit | Attending: Neurology | Admitting: Neurology

## 2018-10-18 ENCOUNTER — Telehealth: Payer: Self-pay | Admitting: Neurology

## 2018-10-18 ENCOUNTER — Other Ambulatory Visit: Payer: Self-pay

## 2018-10-18 DIAGNOSIS — G459 Transient cerebral ischemic attack, unspecified: Secondary | ICD-10-CM | POA: Diagnosis not present

## 2018-10-18 NOTE — Telephone Encounter (Signed)
  I called the patient.  The carotid Doppler study shows no significant stenosis on either side, there was some moderate plaque in the left internal carotid artery, question whether this may have been a source of a TIA event.  The patient will remain on low-dose aspirin, follow-up in 4 months.   Carotid doppler 10/18/18:  Summary: Right Carotid: Velocities in the right ICA are consistent with a 1-39% stenosis.  Left Carotid: Velocities in the left ICA are consistent with a 1-39% stenosis.               See technician's noted listed above.  Vertebrals:  Bilateral vertebral arteries demonstrate antegrade flow. Subclavians: Normal flow hemodynamics were seen in bilateral subclavian              arteries.

## 2018-10-18 NOTE — Progress Notes (Signed)
Bilateral Carotid duplex completed. Preliminary results in Chart review CV proc. 10/18/2018, 12:03 PM

## 2018-11-14 ENCOUNTER — Ambulatory Visit: Payer: Medicare Other | Admitting: Neurology

## 2018-11-14 ENCOUNTER — Encounter

## 2019-01-20 DIAGNOSIS — Z79899 Other long term (current) drug therapy: Secondary | ICD-10-CM | POA: Diagnosis not present

## 2019-01-20 DIAGNOSIS — M2550 Pain in unspecified joint: Secondary | ICD-10-CM | POA: Diagnosis not present

## 2019-01-20 DIAGNOSIS — E663 Overweight: Secondary | ICD-10-CM | POA: Diagnosis not present

## 2019-01-20 DIAGNOSIS — Z6827 Body mass index (BMI) 27.0-27.9, adult: Secondary | ICD-10-CM | POA: Diagnosis not present

## 2019-01-20 DIAGNOSIS — M1A9XX1 Chronic gout, unspecified, with tophus (tophi): Secondary | ICD-10-CM | POA: Diagnosis not present

## 2019-02-14 ENCOUNTER — Ambulatory Visit (INDEPENDENT_AMBULATORY_CARE_PROVIDER_SITE_OTHER): Payer: Medicare Other | Admitting: Neurology

## 2019-02-14 ENCOUNTER — Other Ambulatory Visit: Payer: Self-pay

## 2019-02-14 ENCOUNTER — Encounter: Payer: Self-pay | Admitting: Neurology

## 2019-02-14 DIAGNOSIS — G459 Transient cerebral ischemic attack, unspecified: Secondary | ICD-10-CM | POA: Insufficient documentation

## 2019-02-14 NOTE — Progress Notes (Signed)
Reason for visit: TIA event  Drew Adams is an 78 y.o. male  History of present illness:  Drew Adams is a 78 year old right-handed white male with a history of an episode that occurred on 02 September 2018 with sudden onset of right hand numbness and weakness and right facial numbness.  The patient has had a documented right ulnar neuropathy and he still has intermittent tingling of the fifth finger on the right.  He is regained most of the strength of the right hand, he has not had any further episodes of numbness.  He has undergone a stroke work-up that was unremarkable, MRI of the brain did not show an acute or subacute stroke episode, mild white matter changes were seen.  The patient had a carotid Doppler study that was relatively unremarkable.  He is on aspirin therapy.  He is doing well at this time.  Past Medical History:  Diagnosis Date  . Diverticulosis of colon without hemorrhage 11/28/2012  . Gout    uses prednisone 1-2x/week  . Iron deficiency anemia 11/28/2012  . Paraesophageal hiatal hernia 11/28/2012    Past Surgical History:  Procedure Laterality Date  . CATARACT EXTRACTION Right 2012  . CATARACT EXTRACTION Left Jan 2014  . HERNIA REPAIR Right 1970s   open RIH repair  . LAPAROSCOPIC PARTIAL COLECTOMY N/A 12/27/2012   Procedure: LAPAROSCOPIC  PARTIAL COLECTOMY RIGID PROCTOSCOPY ;  Surgeon: Adin Hector, MD;  Location: [facility];  Service: General;  Laterality: N/A;  . LAPAROSCOPIC SIGMOID COLECTOMY  12/27/2012  . TONSILLECTOMY AND ADENOIDECTOMY      Family History  Problem Relation Age of Onset  . Heart attack Mother   . Parkinson's disease Father   . Diabetes Father   . Cancer Sister   . Diabetes Sister     Social history:  reports that he has never smoked. He has never used smokeless tobacco. He reports that he does not drink alcohol or use drugs.   No Known Allergies  Medications:  Prior to Admission medications   Medication Sig Start Date End Date Taking?  Authorizing Provider  allopurinol (ZYLOPRIM) 300 MG tablet Take 300 mg by mouth 2 (two) times daily.  11/15/12  Yes [provider]  Ascorbic Acid (VITAMIN C) 1000 MG tablet Take 500 mg by mouth daily.    Yes [provider]  aspirin 325 MG tablet Take 325 mg by mouth daily.   Yes [provider]  calcium citrate (CALCITRATE - DOSED IN MG ELEMENTAL CALCIUM) 950 MG tablet Take 1 tablet by mouth daily.   Yes [provider]  Cholecalciferol (VITAMIN D3) 10000 UNITS capsule Take 10,000 Units by mouth daily.    Yes [provider]  ferrous fumarate (FERRO-SEQUELS) 50 MG CR tablet Take 324 mg by mouth 2 (two) times daily.    Yes [provider]  finasteride (PROSCAR) 5 MG tablet Take 5 mg by mouth daily.   Yes [provider]  magnesium oxide (MAG-OX) 400 MG tablet Take 400 mg by mouth daily.   Yes [provider]  Multiple Vitamin (MULTIVITAMIN WITH MINERALS) TABS Take 1 tablet by mouth daily.   Yes [provider]  OVER THE COUNTER MEDICATION 1 tablet daily. [place]   Yes [provider]  predniSONE (DELTASONE) 1 MG tablet Take 2 mg by mouth 2 (two) times daily.    Yes [provider]  predniSONE (DELTASONE) 5 MG tablet Take 5 mg by mouth. 4 daily 10/19/12  Yes [provider]  psyllium (METAMUCIL) 58.6 % packet Take 1 packet by mouth daily.   Yes [provider]  PURE L-ARGININE HCL PO Take 1,800 mg by mouth.   Yes [provider]  UNABLE TO FIND Med Name: truenature prostate health   Yes [provider]  vitamin B-12 (CYANOCOBALAMIN) 500 MCG tablet Take 500 mcg by mouth daily.   Yes [provider]  zinc gluconate 50 MG tablet Take 30 mg by mouth daily.    Yes [provider]    ROS:  Out of a complete 14 system review of symptoms, the patient complains only of the following symptoms, and all other reviewed systems are negative.   Slight right hand clumsiness  Blood pressure (!) 181/87, pulse 93, temperature 98.2 F (36.8 C), temperature source Temporal, height 5' 10.5" (1.791 m), weight 204 lb (92.5 kg).  Physical Exam  General: The patient is alert and cooperative at the time of the examination.  Skin: 1+ edema below the knees is seen bilaterally.   Neurologic Exam  Mental status: The patient is alert and oriented x 3 at the time of the examination. The patient has apparent normal recent and remote memory, with an apparently normal attention span and concentration ability.   Cranial nerves: Facial symmetry is present. Speech is normal, no aphasia or dysarthria is noted. Extraocular movements are full. Visual fields are full.  Motor: The patient has good strength in all 4 extremities.  Strength of the intrinsic muscles of the right hand are essentially normal.  Sensory examination: Soft touch sensation is symmetric on the face, arms, and legs.  Coordination: The patient has good finger-nose-finger and heel-to-shin bilaterally.  Gait and station: The patient has a normal gait. Tandem gait is normal. Romberg is negative. No drift is seen.  Reflexes: Deep tendon reflexes are symmetric.   MRI brain 10/14/18:  IMPRESSION: This MRI of the brain without contrast shows the following: 1. There is minimal chronic microvascular ischemic change. None of these appear to be acute. 2. Atrophy only noted in the posterior frontal and the parietal lobes. 3. There are no acute findings.  * MRI scan images were reviewed online. I agree with the written report.    Assessment/Plan:  1.  Probable TIA  2.  Right ulnar neuropathy  The patient is doing well at this time.  He will remain on aspirin, he will follow-up here on an as-needed basis.  He has not had any recurring episodes.  Jill Alexanders MD 02/14/2019 9:13 AM  [facility] [address] [place], [place]  [address]  Phone [phone] Fax [phone]

## 2019-03-22 DIAGNOSIS — R972 Elevated prostate specific antigen [PSA]: Secondary | ICD-10-CM | POA: Diagnosis not present

## 2019-03-29 DIAGNOSIS — R972 Elevated prostate specific antigen [PSA]: Secondary | ICD-10-CM | POA: Diagnosis not present

## 2019-03-29 DIAGNOSIS — R351 Nocturia: Secondary | ICD-10-CM | POA: Diagnosis not present

## 2019-03-29 DIAGNOSIS — N401 Enlarged prostate with lower urinary tract symptoms: Secondary | ICD-10-CM | POA: Diagnosis not present

## 2019-08-18 ENCOUNTER — Other Ambulatory Visit: Payer: Self-pay

## 2019-08-18 ENCOUNTER — Ambulatory Visit: Payer: Medicare Other | Attending: Internal Medicine

## 2019-08-18 DIAGNOSIS — Z23 Encounter for immunization: Secondary | ICD-10-CM | POA: Insufficient documentation

## 2019-08-18 NOTE — Progress Notes (Signed)
   Covid-19 Vaccination Clinic  Name:  Drew Adams    MRN: [id number] DOB: 09-16-1940  08/18/2019  Drew Adams was observed post Covid-19 immunization for 15 minutes without incidence. He was provided with Vaccine Information Sheet and instruction to access the V-Safe system.   Drew Adams was instructed to call 911 with any severe reactions post vaccine: Marland Kitchen Difficulty breathing  . Swelling of your face and throat  . A fast heartbeat  . A bad rash all over your body  . Dizziness and weakness    Immunizations Administered    Name Date Dose VIS Date Route   Pfizer COVID-19 Vaccine 08/18/2019  9:10 AM 0.3 mL 07/07/2019 Intramuscular   Manufacturer: [place]   Lot: [id number]   [place]: [id number]

## 2019-08-24 ENCOUNTER — Ambulatory Visit: Payer: Medicare Other

## 2019-09-07 ENCOUNTER — Ambulatory Visit: Payer: Medicare Other | Attending: Internal Medicine

## 2019-09-07 DIAGNOSIS — Z23 Encounter for immunization: Secondary | ICD-10-CM | POA: Insufficient documentation

## 2019-09-07 NOTE — Progress Notes (Signed)
   Covid-19 Vaccination Clinic  Name:  Briley Towers    MRN: [id number] DOB: Jun 18, 1941  09/07/2019  Mr. Winnie was observed post Covid-19 immunization for 15 minutes without incidence. He was provided with Vaccine Information Sheet and instruction to access the V-Safe system.   Mr. Barfield was instructed to call 911 with any severe reactions post vaccine: Marland Kitchen Difficulty breathing  . Swelling of your face and throat  . A fast heartbeat  . A bad rash all over your body  . Dizziness and weakness    Immunizations Administered    Name Date Dose VIS Date Route   Pfizer COVID-19 Vaccine 09/07/2019  2:26 PM 0.3 mL 07/07/2019 Intramuscular   Manufacturer: [place]   Lot: [id number]   [place]: [id number]

## 2019-10-05 DIAGNOSIS — Z1389 Encounter for screening for other disorder: Secondary | ICD-10-CM | POA: Diagnosis not present

## 2019-10-05 DIAGNOSIS — I517 Cardiomegaly: Secondary | ICD-10-CM | POA: Diagnosis not present

## 2019-10-05 DIAGNOSIS — R972 Elevated prostate specific antigen [PSA]: Secondary | ICD-10-CM | POA: Diagnosis not present

## 2019-10-05 DIAGNOSIS — Z79899 Other long term (current) drug therapy: Secondary | ICD-10-CM | POA: Diagnosis not present

## 2019-10-05 DIAGNOSIS — G43109 Migraine with aura, not intractable, without status migrainosus: Secondary | ICD-10-CM | POA: Diagnosis not present

## 2019-10-05 DIAGNOSIS — E782 Mixed hyperlipidemia: Secondary | ICD-10-CM | POA: Diagnosis not present

## 2019-10-05 DIAGNOSIS — N529 Male erectile dysfunction, unspecified: Secondary | ICD-10-CM | POA: Diagnosis not present

## 2019-10-05 DIAGNOSIS — D509 Iron deficiency anemia, unspecified: Secondary | ICD-10-CM | POA: Diagnosis not present

## 2019-10-05 DIAGNOSIS — I10 Essential (primary) hypertension: Secondary | ICD-10-CM | POA: Diagnosis not present

## 2019-10-05 DIAGNOSIS — Z0001 Encounter for general adult medical examination with abnormal findings: Secondary | ICD-10-CM | POA: Diagnosis not present

## 2019-10-05 DIAGNOSIS — R011 Cardiac murmur, unspecified: Secondary | ICD-10-CM | POA: Diagnosis not present

## 2019-10-05 DIAGNOSIS — M109 Gout, unspecified: Secondary | ICD-10-CM | POA: Diagnosis not present

## 2019-12-18 ENCOUNTER — Ambulatory Visit: Payer: Medicare Other | Admitting: Interventional Cardiology

## 2020-01-12 DIAGNOSIS — Z6825 Body mass index (BMI) 25.0-25.9, adult: Secondary | ICD-10-CM | POA: Diagnosis not present

## 2020-01-12 DIAGNOSIS — M1A9XX1 Chronic gout, unspecified, with tophus (tophi): Secondary | ICD-10-CM | POA: Diagnosis not present

## 2020-01-12 DIAGNOSIS — E663 Overweight: Secondary | ICD-10-CM | POA: Diagnosis not present

## 2020-01-12 DIAGNOSIS — Z79899 Other long term (current) drug therapy: Secondary | ICD-10-CM | POA: Diagnosis not present

## 2020-01-12 DIAGNOSIS — M2550 Pain in unspecified joint: Secondary | ICD-10-CM | POA: Diagnosis not present

## 2020-02-12 DIAGNOSIS — H0100B Unspecified blepharitis left eye, upper and lower eyelids: Secondary | ICD-10-CM | POA: Diagnosis not present

## 2020-02-12 DIAGNOSIS — H0100A Unspecified blepharitis right eye, upper and lower eyelids: Secondary | ICD-10-CM | POA: Diagnosis not present

## 2020-02-12 DIAGNOSIS — Z961 Presence of intraocular lens: Secondary | ICD-10-CM | POA: Diagnosis not present

## 2020-02-12 DIAGNOSIS — H04123 Dry eye syndrome of bilateral lacrimal glands: Secondary | ICD-10-CM | POA: Diagnosis not present

## 2020-03-08 ENCOUNTER — Ambulatory Visit: Payer: Medicare Other | Admitting: Interventional Cardiology

## 2020-04-10 DIAGNOSIS — R972 Elevated prostate specific antigen [PSA]: Secondary | ICD-10-CM | POA: Diagnosis not present

## 2020-04-17 DIAGNOSIS — R972 Elevated prostate specific antigen [PSA]: Secondary | ICD-10-CM | POA: Diagnosis not present

## 2020-04-17 DIAGNOSIS — N401 Enlarged prostate with lower urinary tract symptoms: Secondary | ICD-10-CM | POA: Diagnosis not present

## 2020-04-17 DIAGNOSIS — R351 Nocturia: Secondary | ICD-10-CM | POA: Diagnosis not present

## 2020-04-17 DIAGNOSIS — N5201 Erectile dysfunction due to arterial insufficiency: Secondary | ICD-10-CM | POA: Diagnosis not present

## 2020-05-15 ENCOUNTER — Ambulatory Visit (INDEPENDENT_AMBULATORY_CARE_PROVIDER_SITE_OTHER): Payer: Medicare Other | Admitting: Interventional Cardiology

## 2020-05-15 ENCOUNTER — Encounter: Payer: Self-pay | Admitting: Interventional Cardiology

## 2020-05-15 ENCOUNTER — Other Ambulatory Visit: Payer: Self-pay

## 2020-05-15 VITALS — BP 180/80 | HR 85 | Ht 70.5 in | Wt 184.0 lb

## 2020-05-15 DIAGNOSIS — R030 Elevated blood-pressure reading, without diagnosis of hypertension: Secondary | ICD-10-CM | POA: Diagnosis not present

## 2020-05-15 DIAGNOSIS — N183 Chronic kidney disease, stage 3 unspecified: Secondary | ICD-10-CM

## 2020-05-15 DIAGNOSIS — E782 Mixed hyperlipidemia: Secondary | ICD-10-CM

## 2020-05-15 DIAGNOSIS — I350 Nonrheumatic aortic (valve) stenosis: Secondary | ICD-10-CM

## 2020-05-15 NOTE — Patient Instructions (Signed)
Medication Instructions:  Your physician recommends that you continue on your current medications as directed. Please refer to the Current Medication list given to you today.  *If you need a refill on your cardiac medications before your next appointment, please call your pharmacy*   Lab Work: None  If you have labs (blood work) drawn today and your tests are completely normal, you will receive your results only by:  [place] (if you have MyChart) OR  A paper copy in the mail If you have any lab test that is abnormal or we need to change your treatment, we will call you to review the results.   Testing/Procedures: Your physician has requested that you have an echocardiogram. Echocardiography is a painless test that uses sound waves to create images of your heart. It provides your doctor with information about the size and shape of your heart and how well your hearts chambers and valves are working. This procedure takes approximately one hour. There are no restrictions for this procedure.  Follow-Up: At [facility], you and your health needs are our priority.  As part of our continuing mission to provide you with exceptional heart care, we have created designated Provider Care Teams.  These Care Teams include your primary Cardiologist (physician) and Advanced Practice Providers (APPs -  Physician Assistants and Nurse Practitioners) who all work together to provide you with the care you need, when you need it.  We recommend signing up for the patient portal called "MyChart".  Sign up information is provided on this After Visit Summary.  MyChart is used to connect with patients for Virtual Visits (Telemedicine).  Patients are able to view lab/test results, encounter notes, upcoming appointments, etc.  Non-urgent messages can be sent to your provider as well.   To learn more about what you can do with MyChart, go to [email or web address].    Your next appointment:   12  month(s)  The format for your next appointment:   In Person  Provider:   You may see Casandra Doffing, MD or one of the following Advanced Practice Providers on your designated Care Team:    Melina Copa, PA-C  Ermalinda Barrios, PA-C    Other Instructions None

## 2020-05-15 NOTE — Progress Notes (Signed)
Cardiology Office Note   Date:  05/15/2020   ID:  Joshaua Epple, DOB 09-01-1940, MRN [id number]  PCP:  Josetta Huddle, MD    No chief complaint on file.  Aortic stenosis  Wt Readings from Last 3 Encounters:  05/15/20 184 lb (83.5 kg)  02/14/19 204 lb (92.5 kg)  10/11/18 189 lb (85.7 kg)       History of Present Illness: Drew Adams is a 79 y.o. male who is being seen today for the evaluation of aortic stenosis at the request of Josetta Huddle, MD.  2015 echo showed: "Left ventricle: The cavity size was normal. There was  moderate concentric hypertrophy. Systolic function was  normal. The estimated ejection fraction was in the range  of 55% to 60%. Wall motion was normal; there were no  regional wall motion abnormalities. Doppler parameters are  consistent with abnormal left ventricular relaxation  (grade 1 diastolic dysfunction).  - Aortic valve: There was moderate stenosis. Mild  regurgitation.  - Left atrium: The atrium was mildly dilated.  - Atrial septum: No defect or patent foramen ovale was  identified."   BP at home is typically in the 409B systolic range.  He has not been on BP meds for several years.  He was on something years ago but had side effects so stopped the medicine. He does not remember the name but states it was a combination pill.  Walking is limited by gout.  No regular walking.  Denies : Chest pain. Dizziness. Leg edema. Nitroglycerin use. Orthopnea. Palpitations. Paroxysmal nocturnal dyspnea. Shortness of breath. Syncope.   Joint pain limits exercise.      Past Medical History:  Diagnosis Date  . BPH (benign prostatic hyperplasia)   . Cardiac murmur   . Cardiomegaly   . Diverticulosis of colon without hemorrhage 11/28/2012  . ED (erectile dysfunction)   . Elevated blood pressure reading   . Elevated PSA   . Gout    uses prednisone 1-2x/week  . Hypercholesterolemia   . Hypertension   . Iron deficiency anemia 11/28/2012   . Mixed hyperlipidemia   . Ocular migraine   . Other fecal abnormalities   . Other specified diseases of intestine   . Pain in unspecified joint   . Paraesophageal hiatal hernia 11/28/2012  . Radiculopathy   . Ulnar nerve abnormality   . Ventricular hypertrophy     Past Surgical History:  Procedure Laterality Date  . CATARACT EXTRACTION Right 2012  . CATARACT EXTRACTION Left Jan 2014  . HERNIA REPAIR Right 1970s   open RIH repair  . LAPAROSCOPIC PARTIAL COLECTOMY N/A 12/27/2012   Procedure: LAPAROSCOPIC  PARTIAL COLECTOMY RIGID PROCTOSCOPY ;  Surgeon: Adin Hector, MD;  Location: [facility];  Service: General;  Laterality: N/A;  . LAPAROSCOPIC SIGMOID COLECTOMY  12/27/2012  . TONSILLECTOMY AND ADENOIDECTOMY       Current Outpatient Medications  Medication Sig Dispense Refill  . allopurinol (ZYLOPRIM) 300 MG tablet Take 300 mg by mouth 2 (two) times daily.     . Ascorbic Acid (VITAMIN C) 1000 MG tablet Take 500 mg by mouth daily.     Marland Kitchen aspirin 81 MG EC tablet Take 81 mg by mouth daily.     . calcium citrate (CALCITRATE - DOSED IN MG ELEMENTAL CALCIUM) 950 MG tablet Take 1 tablet by mouth daily.    . Cholecalciferol (VITAMIN D3) 10000 UNITS capsule Take 10,000 Units by mouth daily.     . ferrous fumarate (FERRO-SEQUELS) 50 MG CR  tablet Take 324 mg by mouth 2 (two) times daily.     . finasteride (PROSCAR) 5 MG tablet Take 5 mg by mouth daily.    . magnesium oxide (MAG-OX) 400 MG tablet Take 400 mg by mouth daily.    . Multiple Vitamin (MULTIVITAMIN WITH MINERALS) TABS Take 1 tablet by mouth daily.    Marland Kitchen OVER THE COUNTER MEDICATION 1 tablet daily. Trunature Prostate Health    . psyllium (METAMUCIL) 58.6 % packet Take 1 packet by mouth daily.    Marland Kitchen PURE L-ARGININE HCL PO Take 1,800 mg by mouth.    Marland Kitchen UNABLE TO FIND Med Name: truenature prostate health    . vitamin B-12 (CYANOCOBALAMIN) 500 MCG tablet Take 500 mcg by mouth daily.    Marland Kitchen zinc gluconate 50 MG tablet Take 30 mg by mouth daily.       No current facility-administered medications for this visit.    Allergies:   Exforge [amlodipine besylate-valsartan] and Simvastatin    Social History:  The patient  reports that he has never smoked. He has never used smokeless tobacco. He reports that he does not drink alcohol and does not use drugs.   Family History:  The patient's family history includes CAD in his mother; Cancer in his sister; Diabetes in his father and sister; Heart attack in his mother; Parkinson's disease in his father.    ROS:  Please see the history of present illness.   Otherwise, review of systems are positive for joint pains.   All other systems are reviewed and negative.    PHYSICAL EXAM: VS:  BP (!) 180/80   Pulse 85   Ht 5' 10.5" (1.791 m)   Wt 184 lb (83.5 kg)   SpO2 98%   BMI 26.03 kg/m  , BMI Body mass index is 26.03 kg/m. GEN: Well nourished, well developed, in no acute distress  HEENT: normal  Neck: no JVD, carotid bruits, or masses Cardiac: RRR; no murmurs, rubs, or gallops,no edema  Respiratory:  clear to auscultation bilaterally, normal work of breathing GI: soft, nontender, nondistended, + BS MS: no deformity or atrophy  Skin: warm and dry, no rash Neuro:  Strength and sensation are intact Psych: euthymic mood, full affect   EKG:   The ekg ordered today demonstrates NSR, LVH, no ST changes   Recent Labs: No results found for requested labs within last 8760 hours.   Lipid Panel No results found for: CHOL, TRIG, HDL, CHOLHDL, VLDL, LDLCALC, LDLDIRECT   Other studies Reviewed: Additional studies/ records that were reviewed today with results demonstrating: 2015 echo reviewed; labs reviewed   ASSESSMENT AND PLAN:  1. Aortic stenosis: Moderate in 2015.  Will recheck echo.  No sx of severe AS.  2. Elevated blood pressure without diagnosis of HTN: Recheck 168/64.    At some point, he may need BP meds.   Low salt diet.  Regular exercise will be helpful.  3. He had his COVID  vaccines.  4. Hyperlipidemia: LDL 149- intolerant of simvastatin. 5. CKD: Cr 1.46.  Avoid nephrotoxins     Current medicines are reviewed at length with the patient today.  The patient concerns regarding his medicines were addressed.  The following changes have been made:  No change  Labs/ tests ordered today include:  No orders of the defined types were placed in this encounter.   Recommend 150 minutes/week of aerobic exercise Low fat, low carb, high fiber diet recommended  Disposition:   FU in 1 year or  sooner if sognificant echo abnormality   Signed, Larae Grooms, MD  05/15/2020 2:54 PM    [facility] [place], [place], [place]  [address] Phone: [phone]; Fax: [phone]

## 2020-06-05 ENCOUNTER — Other Ambulatory Visit: Payer: Self-pay

## 2020-06-05 ENCOUNTER — Ambulatory Visit (HOSPITAL_COMMUNITY): Payer: Medicare Other | Attending: Cardiovascular Disease

## 2020-06-05 DIAGNOSIS — I350 Nonrheumatic aortic (valve) stenosis: Secondary | ICD-10-CM | POA: Diagnosis not present

## 2020-06-05 LAB — ECHOCARDIOGRAM COMPLETE
AR max vel: 1.05 cm2
AV Area VTI: 1.11 cm2
AV Area mean vel: 1.08 cm2
AV Mean grad: 36 mmHg
AV Peak grad: 69.1 mmHg
Ao pk vel: 4.16 m/s
Area-P 1/2: 2.82 cm2
P 1/2 time: 399 msec
S' Lateral: 3.7 cm

## 2020-07-05 DIAGNOSIS — M1A9XX1 Chronic gout, unspecified, with tophus (tophi): Secondary | ICD-10-CM | POA: Diagnosis not present

## 2020-07-05 DIAGNOSIS — E663 Overweight: Secondary | ICD-10-CM | POA: Diagnosis not present

## 2020-07-05 DIAGNOSIS — Z79899 Other long term (current) drug therapy: Secondary | ICD-10-CM | POA: Diagnosis not present

## 2020-07-05 DIAGNOSIS — Z6825 Body mass index (BMI) 25.0-25.9, adult: Secondary | ICD-10-CM | POA: Diagnosis not present

## 2020-07-05 DIAGNOSIS — M2550 Pain in unspecified joint: Secondary | ICD-10-CM | POA: Diagnosis not present

## 2020-07-16 DIAGNOSIS — Z20822 Contact with and (suspected) exposure to covid-19: Secondary | ICD-10-CM | POA: Diagnosis not present

## 2020-08-19 ENCOUNTER — Telehealth: Payer: Self-pay | Admitting: Neurology

## 2020-08-19 NOTE — Telephone Encounter (Signed)
Pt called needing to speak to the RN regarding some changes that need to be made to his medications. Please advise.

## 2020-08-20 NOTE — Telephone Encounter (Signed)
Patient called to update his medication list.

## 2020-09-26 NOTE — Progress Notes (Signed)
Cardiology Office Note   Date:  09/27/2020   ID:  Drew Adams, DOB [birth year], MRN [id number]  PCP:  Josetta Huddle, MD    No chief complaint on file.  Aortic stenosis  Wt Readings from Last 3 Encounters:  09/27/20 183 lb (83 kg)  05/15/20 184 lb (83.5 kg)  02/14/19 204 lb (92.5 kg)       History of Present Illness: Drew Adams is a 80 y.o. male   who is being seen today for the evaluation of aortic stenosis at the request of Josetta Huddle, MD.  2015 echo showed: "Left ventricle: The cavity size was normal. There was  moderate concentric hypertrophy. Systolic function was  normal. The estimated ejection fraction was in the range  of 55% to 60%. Wall motion was normal; there were no  regional wall motion abnormalities. Doppler parameters are  consistent with abnormal left ventricular relaxation  (grade 1 diastolic dysfunction).  - Aortic valve: There was moderate stenosis. Mild  regurgitation.  - Left atrium: The atrium was mildly dilated.  - Atrial septum: No defect or patent foramen ovale was  identified."   BP at home is typically in the 962X systolic range.  He has not been on BP meds for several years.  He was on something years ago but had side effects so stopped the medicine. He does not remember the name but states it was a combination pill.  Walking is limited by gout.  No regular walking.  11/21 echo showed: "Left ventricular ejection fraction, by estimation, is 60 to 65%. The  left ventricle has normal function. The left ventricle has no regional  wall motion abnormalities. There is moderate left ventricular hypertrophy.  Left ventricular diastolic  parameters are consistent with Grade I diastolic dysfunction (impaired  relaxation).  2. Right ventricular systolic function is normal. The right ventricular  size is normal.  3. Left atrial size was mildly dilated.  4. The mitral valve is degenerative. No evidence of mitral valve   regurgitation. No evidence of mitral stenosis. Moderate mitral annular  calcification.  5. 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 In 2015 AVA was severe 0.8 cm2 and DVI 0.22  Gradients slightly higher now with AVA 1.1 cm2 and DVI 0.32. Peak velocity  is over 4 msec Degree of AR worse. Constellation worrisime for need AVR.  Suggest CTA to size aorta and consider  right and left heart cath if clinically indicated . The aortic valve has  an indeterminant number of cusps. There is severe calcifcation of the  aortic valve. There is severe thickening of the aortic valve. Aortic valve  regurgitation is moderate. Moderate  to severe aortic valve stenosis.  6. Aortic dilatation noted. There is moderate dilatation of the aortic  root and of the ascending aorta, measuring 45 mm. "  No symptoms of chest discomfort, volume overload, shortness of breath, orthopnea, lightheadedness or syncope.  Denies : Chest pain. Dizziness. Leg edema. Nitroglycerin use. Orthopnea. Palpitations. Paroxysmal nocturnal dyspnea. Shortness of breath. Syncope.   Did not tolerate simvastiatin due to muscle pain.  Wife tolerates Crestor so he is willing to try.    Past Medical History:  Diagnosis Date  . BPH (benign prostatic hyperplasia)   . Cardiac murmur   . Cardiomegaly   . Diverticulosis of colon without hemorrhage 11/28/2012  . ED (erectile dysfunction)   . Elevated blood pressure reading   . Elevated PSA   . Gout    uses prednisone 1-2x/week  . Hypercholesterolemia   .  Hypertension   . Iron deficiency anemia 11/28/2012  . Mixed hyperlipidemia   . Ocular migraine   . Other fecal abnormalities   . Other specified diseases of intestine   . Pain in unspecified joint   . Paraesophageal hiatal hernia 11/28/2012  . Radiculopathy   . Ulnar nerve abnormality   . Ventricular hypertrophy     Past Surgical History:  Procedure Laterality Date  . CATARACT EXTRACTION Right 2012  . CATARACT EXTRACTION Left Jan 2014  . HERNIA REPAIR  Right 1970s   open RIH repair  . LAPAROSCOPIC PARTIAL COLECTOMY N/A 12/27/2012   Procedure: LAPAROSCOPIC  PARTIAL COLECTOMY RIGID PROCTOSCOPY ;  Surgeon: Adin Hector, MD;  Location: [facility];  Service: General;  Laterality: N/A;  . LAPAROSCOPIC SIGMOID COLECTOMY  12/27/2012  . TONSILLECTOMY AND ADENOIDECTOMY       Current Outpatient Medications  Medication Sig Dispense Refill  . allopurinol (ZYLOPRIM) 300 MG tablet Take 300 mg by mouth 2 (two) times daily.     . Ascorbic Acid (VITAMIN C) 1000 MG tablet Take 500 mg by mouth daily.     Marland Kitchen aspirin 81 MG EC tablet Take 81 mg by mouth daily.     . calcium citrate (CALCITRATE - DOSED IN MG ELEMENTAL CALCIUM) 950 MG tablet Take 1 tablet by mouth daily.    . Cholecalciferol (VITAMIN D3) 10000 UNITS capsule Take 10,000 Units by mouth daily.     . finasteride (PROSCAR) 5 MG tablet Take 5 mg by mouth daily.    . magnesium oxide (MAG-OX) 400 MG tablet Take 400 mg by mouth daily.    . Multiple Vitamin (MULTIVITAMIN WITH MINERALS) TABS Take 1 tablet by mouth daily.    Marland Kitchen OVER THE COUNTER MEDICATION 1 tablet daily. Trunature Prostate Health    . psyllium (METAMUCIL) 58.6 % packet Take 1 packet by mouth daily.    Marland Kitchen PURE L-ARGININE HCL PO Take 1,800 mg by mouth.    . vitamin B-12 (CYANOCOBALAMIN) 500 MCG tablet Take 500 mcg by mouth daily.    Marland Kitchen zinc gluconate 50 MG tablet Take 30 mg by mouth daily.      No current facility-administered medications for this visit.    Allergies:   Exforge [amlodipine besylate-valsartan] and Simvastatin    Social History:  The patient  reports that he has never smoked. He has never used smokeless tobacco. He reports that he does not drink alcohol and does not use drugs.   Family History:  The patient's family history includes CAD in his mother; Cancer in his sister; Diabetes in his father and sister; Heart attack in his mother; Parkinson's disease in his father.    ROS:  Please see the history of present illness.    Otherwise, review of systems are positive for increased BP in the MDs ofice.   All other systems are reviewed and negative.    PHYSICAL EXAM: VS:  BP (!) 160/80   Pulse 94   Ht 5' 10.5" (1.791 m)   Wt 183 lb (83 kg)   SpO2 94%   BMI 25.89 kg/m  , BMI Body mass index is 25.89 kg/m. GEN: Well nourished, well developed, in no acute distress  HEENT: normal  Neck: no JVD, carotid bruits, or masses Cardiac: RRR; 2/6 early systolic murmur, no rubs, or gallops,no edema  Respiratory:  clear to auscultation bilaterally, normal work of breathing GI: soft, nontender, nondistended, + BS MS: no deformity or atrophy  Skin: warm and dry, no rash Neuro:  Strength and sensation are intact Psych: euthymic mood, full affect    Recent Labs: No results found for requested labs within last 8760 hours.   Lipid Panel No results found for: CHOL, TRIG, HDL, CHOLHDL, VLDL, LDLCALC, LDLDIRECT   Other studies Reviewed: Additional studies/ records that were reviewed today with results demonstrating: 11/21 echo reviewed.   ASSESSMENT AND PLAN:  1. Aortic stenosis: Moderate to severe AS with moderately dilated aortic rot, 45 mm.  No symptoms of chest discomfort, volume overload, shortness of breath, orthopnea, lightheadedness or syncope.  We went over the etiology of why the symptoms might occur if his aortic stenosis was to worsen.  He will let us know if he has any of the above symptoms or any change in exercise tolerance. Check echo in 06/2021. 2. Elevated BP without HTN: We spoke about ACE inhibitor's as a potential way to slow the progression of aortic stenosis.  He will monitor his blood pressure at home.  It has been better controlled at home typically compared to what it is at the doctor's office. 3. Hyperlipidemia: LDL 149 at last check.  Start rosuvastatin 10 mg daily.  Liver and lipids in 3 months. 4. CKD: Avoid nephrotoxins.    Current medicines are reviewed at length with the patient today.   The patient concerns regarding his medicines were addressed.  The following changes have been made:  No change  Labs/ tests ordered today include:   Orders Placed This Encounter  Procedures  . ECHOCARDIOGRAM COMPLETE    Recommend 150 minutes/week of aerobic exercise Low fat, low carb, high fiber diet recommended  Disposition:   FU in 9 months   Signed, Larae Grooms, MD  09/27/2020 10:45 AM    [facility] [place], [place], [place]  [address] Phone: [phone]; Fax: [phone]

## 2020-09-27 ENCOUNTER — Other Ambulatory Visit: Payer: Self-pay

## 2020-09-27 ENCOUNTER — Ambulatory Visit: Payer: PPO | Admitting: Interventional Cardiology

## 2020-09-27 ENCOUNTER — Encounter: Payer: Self-pay | Admitting: Interventional Cardiology

## 2020-09-27 VITALS — BP 160/80 | HR 94 | Ht 70.5 in | Wt 183.0 lb

## 2020-09-27 DIAGNOSIS — I350 Nonrheumatic aortic (valve) stenosis: Secondary | ICD-10-CM

## 2020-09-27 DIAGNOSIS — R030 Elevated blood-pressure reading, without diagnosis of hypertension: Secondary | ICD-10-CM

## 2020-09-27 DIAGNOSIS — N183 Chronic kidney disease, stage 3 unspecified: Secondary | ICD-10-CM | POA: Diagnosis not present

## 2020-09-27 DIAGNOSIS — E782 Mixed hyperlipidemia: Secondary | ICD-10-CM | POA: Diagnosis not present

## 2020-09-27 MED ORDER — ROSUVASTATIN CALCIUM 10 MG PO TABS: 10.0000 mg | ORAL_TABLET | Freq: Every day | ORAL | 3 refills | Status: DC

## 2020-09-27 NOTE — Patient Instructions (Addendum)
   Medication Instructions:  Your physician has recommended you make the following change in your medication: Start Rosuvastatin 10 mg by mouth daily *If you need a refill on your cardiac medications before your next appointment, please call your pharmacy*   Lab Work: Your physician recommends that you return for lab work in: about 3 months.  Lipid and liver profiles.  This will be fasting  If you have labs (blood work) drawn today and your tests are completely normal, you will receive your results only by: Marland Kitchen MyChart Message (if you have MyChart) OR . A paper copy in the mail If you have any lab test that is abnormal or we need to change your treatment, we will call you to review the results.   Testing/Procedures: Your physician has requested that you have an echocardiogram. Echocardiography is a painless test that uses sound waves to create images of your heart. It provides your doctor with information about the size and shape of your heart and how well your heart's chambers and valves are working. This procedure takes approximately one hour. There are no restrictions for this procedure. To be done in November/December 2022     Follow-Up: At [facility], you and your health needs are our priority.  As part of our continuing mission to provide you with exceptional heart care, we have created designated Provider Care Teams.  These Care Teams include your primary Cardiologist (physician) and Advanced Practice Providers (APPs -  Physician Assistants and Nurse Practitioners) who all work together to provide you with the care you need, when you need it.  We recommend signing up for the patient portal called "MyChart".  Sign up information is provided on this After Visit Summary.  MyChart is used to connect with patients for Virtual Visits (Telemedicine).  Patients are able to view lab/test results, encounter notes, upcoming appointments, etc.  Non-urgent messages can be sent to your provider  as well.   To learn more about what you can do with MyChart, go to [email or web address].    Your next appointment:   December 2022 after echo  The format for your next appointment:   In Person  Provider:   You may see Larae Grooms, MD or one of the following Advanced Practice Providers on your designated Care Team:    Melina Copa, PA-C  Ermalinda Barrios, PA-C    Other Instructions

## 2020-12-17 DIAGNOSIS — L570 Actinic keratosis: Secondary | ICD-10-CM | POA: Diagnosis not present

## 2020-12-18 ENCOUNTER — Telehealth: Payer: Self-pay | Admitting: Interventional Cardiology

## 2020-12-18 NOTE — Telephone Encounter (Signed)
Patient notified.  He does not want to be referred to lipid clinic at this time.  He is having lab work checked by PCP this summer and wants to wait to see results.  If cholesterol levels still elevated he will contact us to proceed with referral.

## 2020-12-18 NOTE — Telephone Encounter (Signed)
Refer to lipid clinic 

## 2020-12-18 NOTE — Telephone Encounter (Signed)
Pt c/o medication issue:  1. Name of Medication: rosuvastatin (CRESTOR) 10 MG tablet  2. How are you currently taking this medication (dosage and times per day)? Patient has not taken for a couple weeks  3. Are you having a reaction (difficulty breathing--STAT)? no  4. What is your medication issue? While the patient was taking the medication he experienced cramping and pain in his legs. He has stopped the medication and the symptoms have resolved. He wanted to know what to do for his cholesterol.  Please advise

## 2021-01-03 DIAGNOSIS — E663 Overweight: Secondary | ICD-10-CM | POA: Diagnosis not present

## 2021-01-03 DIAGNOSIS — M1A9XX1 Chronic gout, unspecified, with tophus (tophi): Secondary | ICD-10-CM | POA: Diagnosis not present

## 2021-01-03 DIAGNOSIS — Z6828 Body mass index (BMI) 28.0-28.9, adult: Secondary | ICD-10-CM | POA: Diagnosis not present

## 2021-01-03 DIAGNOSIS — Z79899 Other long term (current) drug therapy: Secondary | ICD-10-CM | POA: Diagnosis not present

## 2021-01-03 DIAGNOSIS — M2550 Pain in unspecified joint: Secondary | ICD-10-CM | POA: Diagnosis not present

## 2021-01-08 ENCOUNTER — Other Ambulatory Visit: Payer: PPO

## 2021-02-13 DIAGNOSIS — M109 Gout, unspecified: Secondary | ICD-10-CM | POA: Diagnosis not present

## 2021-02-13 DIAGNOSIS — I10 Essential (primary) hypertension: Secondary | ICD-10-CM | POA: Diagnosis not present

## 2021-02-13 DIAGNOSIS — E569 Vitamin deficiency, unspecified: Secondary | ICD-10-CM | POA: Diagnosis not present

## 2021-02-13 DIAGNOSIS — I517 Cardiomegaly: Secondary | ICD-10-CM | POA: Diagnosis not present

## 2021-02-13 DIAGNOSIS — R011 Cardiac murmur, unspecified: Secondary | ICD-10-CM | POA: Diagnosis not present

## 2021-02-13 DIAGNOSIS — R972 Elevated prostate specific antigen [PSA]: Secondary | ICD-10-CM | POA: Diagnosis not present

## 2021-02-13 DIAGNOSIS — Z23 Encounter for immunization: Secondary | ICD-10-CM | POA: Diagnosis not present

## 2021-02-13 DIAGNOSIS — E782 Mixed hyperlipidemia: Secondary | ICD-10-CM | POA: Diagnosis not present

## 2021-02-13 DIAGNOSIS — Z79899 Other long term (current) drug therapy: Secondary | ICD-10-CM | POA: Diagnosis not present

## 2021-02-13 DIAGNOSIS — Z1389 Encounter for screening for other disorder: Secondary | ICD-10-CM | POA: Diagnosis not present

## 2021-02-13 DIAGNOSIS — N529 Male erectile dysfunction, unspecified: Secondary | ICD-10-CM | POA: Diagnosis not present

## 2021-02-13 DIAGNOSIS — Z0001 Encounter for general adult medical examination with abnormal findings: Secondary | ICD-10-CM | POA: Diagnosis not present

## 2021-02-13 DIAGNOSIS — D509 Iron deficiency anemia, unspecified: Secondary | ICD-10-CM | POA: Diagnosis not present

## 2021-02-17 DIAGNOSIS — Z961 Presence of intraocular lens: Secondary | ICD-10-CM | POA: Diagnosis not present

## 2021-02-17 DIAGNOSIS — H02055 Trichiasis without entropian left lower eyelid: Secondary | ICD-10-CM | POA: Diagnosis not present

## 2021-02-17 DIAGNOSIS — H04123 Dry eye syndrome of bilateral lacrimal glands: Secondary | ICD-10-CM | POA: Diagnosis not present

## 2021-02-17 DIAGNOSIS — H0100B Unspecified blepharitis left eye, upper and lower eyelids: Secondary | ICD-10-CM | POA: Diagnosis not present

## 2021-02-19 DIAGNOSIS — D509 Iron deficiency anemia, unspecified: Secondary | ICD-10-CM | POA: Diagnosis not present

## 2021-02-26 DIAGNOSIS — E611 Iron deficiency: Secondary | ICD-10-CM | POA: Diagnosis not present

## 2021-02-26 DIAGNOSIS — Z86010 Personal history of colonic polyps: Secondary | ICD-10-CM | POA: Diagnosis not present

## 2021-02-26 DIAGNOSIS — R195 Other fecal abnormalities: Secondary | ICD-10-CM | POA: Diagnosis not present

## 2021-04-09 DIAGNOSIS — R972 Elevated prostate specific antigen [PSA]: Secondary | ICD-10-CM | POA: Diagnosis not present

## 2021-04-16 DIAGNOSIS — R972 Elevated prostate specific antigen [PSA]: Secondary | ICD-10-CM | POA: Diagnosis not present

## 2021-04-16 DIAGNOSIS — N401 Enlarged prostate with lower urinary tract symptoms: Secondary | ICD-10-CM | POA: Diagnosis not present

## 2021-04-16 DIAGNOSIS — R351 Nocturia: Secondary | ICD-10-CM | POA: Diagnosis not present

## 2021-06-16 ENCOUNTER — Telehealth: Payer: Self-pay | Admitting: Interventional Cardiology

## 2021-06-16 NOTE — Telephone Encounter (Signed)
STAT if patient feels like he/she is going to faint   Are you dizzy now? no  Do you feel faint or have you passed out? no  Do you have any other symptoms? no  Have you checked your HR and BP (record if available)? 73, 74  Patient is calling as advised per Dr. Irish Lack.  He states he just recently started to feel dizzy.  More recently it happens when he turns in bed from one side to the other.  When he stands up very fast it happens. I started to happen about 1-2 ago.

## 2021-06-16 NOTE — Telephone Encounter (Signed)
I spoke with patient. He reports dizziness for the last couple of weeks. Usually occurs when changing from one side to another in bed.  He is careful when standing up but did have episode of dizziness when standing quickly recently.  No chest pain. No shortness of breath.  Has been walking 30 minutes every am without problems.  No dizziness when walking. No other complaints.  He has echo scheduled for January.  Will call tomorrow to see if this can be moved to an earlier date.

## 2021-06-17 NOTE — Telephone Encounter (Signed)
Agree with moving up th echo.  May need cath as well to further eval valve.  Let us know if walking becomes affected.  THere are data now showing that earlier intervention leads to better outcomes so would have patient tentatively plan for R/L heart cath in December after echo.

## 2021-06-17 NOTE — Telephone Encounter (Signed)
Patient notified.  Echo moved to 07/04/21 and appointment made with Dr Irish Lack for 12/16

## 2021-07-02 ENCOUNTER — Other Ambulatory Visit (HOSPITAL_COMMUNITY): Payer: PPO

## 2021-07-04 ENCOUNTER — Other Ambulatory Visit: Payer: Self-pay

## 2021-07-04 ENCOUNTER — Ambulatory Visit (HOSPITAL_COMMUNITY): Payer: PPO | Attending: Cardiology

## 2021-07-04 DIAGNOSIS — I350 Nonrheumatic aortic (valve) stenosis: Secondary | ICD-10-CM | POA: Insufficient documentation

## 2021-07-04 LAB — ECHOCARDIOGRAM COMPLETE
AR max vel: 0.93 cm2
AV Area VTI: 0.96 cm2
AV Area mean vel: 0.89 cm2
AV Mean grad: 35 mmHg
AV Peak grad: 58.4 mmHg
Ao pk vel: 3.82 m/s
Area-P 1/2: 2.95 cm2
MV M vel: 6.69 m/s
MV Peak grad: 179 mmHg
P 1/2 time: 378 msec
Radius: 0.7 cm
S' Lateral: 3.4 cm

## 2021-07-11 ENCOUNTER — Ambulatory Visit: Payer: PPO | Admitting: Interventional Cardiology

## 2021-07-11 ENCOUNTER — Other Ambulatory Visit: Payer: Self-pay

## 2021-07-11 ENCOUNTER — Encounter: Payer: Self-pay | Admitting: Interventional Cardiology

## 2021-07-11 VITALS — BP 150/90 | HR 83 | Ht 70.5 in | Wt 197.8 lb

## 2021-07-11 DIAGNOSIS — N183 Chronic kidney disease, stage 3 unspecified: Secondary | ICD-10-CM

## 2021-07-11 DIAGNOSIS — E782 Mixed hyperlipidemia: Secondary | ICD-10-CM

## 2021-07-11 DIAGNOSIS — I10 Essential (primary) hypertension: Secondary | ICD-10-CM

## 2021-07-11 DIAGNOSIS — I350 Nonrheumatic aortic (valve) stenosis: Secondary | ICD-10-CM | POA: Diagnosis not present

## 2021-07-11 NOTE — Patient Instructions (Addendum)
Medication Instructions:  Your physician recommends that you continue on your current medications as directed. Please refer to the Current Medication list given to you today.  *If you need a refill on your cardiac medications before your next appointment, please call your pharmacy*   Lab Work: none If you have labs (blood work) drawn today and your tests are completely normal, you will receive your results only by: [place] (if you have MyChart) OR A paper copy in the mail If you have any lab test that is abnormal or we need to change your treatment, we will call you to review the results.   Testing/Procedures: none   Follow-Up: At [facility] you and your health needs are our priority.  As part of our continuing mission to provide you with exceptional heart care, we have created designated Provider Care Teams.  These Care Teams include your primary Cardiologist (physician) and Advanced Practice Providers (APPs -  Physician Assistants and Nurse Practitioners) who all work together to provide you with the care you need, when you need it.  We recommend signing up for the patient portal called "MyChart".  Sign up information is provided on this After Visit Summary.  MyChart is used to connect with patients for Virtual Visits (Telemedicine).  Patients are able to view lab/test results, encounter notes, upcoming appointments, etc.  Non-urgent messages can be sent to your provider as well.   To learn more about what you can do with MyChart, go to [email or web address].    Your next appointment:   6 month(s)  The format for your next appointment:   In Person  Provider:   Larae Grooms, MD     Other Instructions   You have been referred to Dr Cyndia Bent at [facility]

## 2021-07-11 NOTE — Progress Notes (Signed)
Heart failure symptoms.    Cardiology Office Note   Date:  07/11/2021   ID:  Drew Adams, DOB Apr 29, 1941, MRN [id number]  PCP:  Josetta Huddle, MD    No chief complaint on file.  Aortic valve  Wt Readings from Last 3 Encounters:  07/11/21 197 lb 12.8 oz (89.7 kg)  09/27/20 183 lb (83 kg)  05/15/20 184 lb (83.5 kg)       History of Present Illness: Drew Adams is a 80 y.o. male  who is being seen today for the evaluation of aortic stenosis.  He has had progressive aortic stenosis and aortic dilation since 2015.  In 2022 echo showed: "Left ventricular ejection fraction, by estimation, is 60 to 65%. The left  ventricle has normal function. The left ventricle has no regional wall  motion abnormalities. There is mild  left ventricular hypertrophy. Left ventricular diastolic parameters are  consistent with Grade I diastolic dysfunction (impaired relaxation).   2. Right ventricular systolic function is normal. The right ventricular  size is normal.   3. Moderate mitral valve regurgitation.   4. AV is thickened, calcified with restricted motion Peak and mean  gradients through the valve aer 58 and 33 mm Hg respectively AVA (VTI) is  1 cm2. Dimensionless index is 0.28 consistent with moderate to severe AS .  Aortic valve regurgitation is  moderate.   5. Aortic dilatation noted. There is mild dilatation of the aortic root,  measuring 38 mm. There is severe dilatation of the ascending aorta,  measuring 48 mm.   Comparison(s): 06/05/20 EF 60-65%. Moderate -severe AS 95mmHg mean PG,  34mmHg peak PG. "  Denies : Chest pain. Dizziness. Leg edema. Nitroglycerin use. Orthopnea. Palpitations. Paroxysmal nocturnal dyspnea. Shortness of breath. Syncope.    Intolerant of statins and of Exforge in the past.  He continues to walk daily and about a 4 mile an hour pace.  He denies any cardiac symptoms.    Past Medical History:  Diagnosis Date   BPH (benign prostatic hyperplasia)     Cardiac murmur    Cardiomegaly    Diverticulosis of colon without hemorrhage 11/28/2012   ED (erectile dysfunction)    Elevated blood pressure reading    Elevated PSA    Gout    uses prednisone 1-2x/week   Hypercholesterolemia    Hypertension    Iron deficiency anemia 11/28/2012   Mixed hyperlipidemia    Ocular migraine    Other fecal abnormalities    Other specified diseases of intestine    Pain in unspecified joint    Paraesophageal hiatal hernia 11/28/2012   Radiculopathy    Ulnar nerve abnormality    Ventricular hypertrophy     Past Surgical History:  Procedure Laterality Date   CATARACT EXTRACTION Right 2012   CATARACT EXTRACTION Left Jan 2014   HERNIA REPAIR Right 1970s   open RIH repair   LAPAROSCOPIC PARTIAL COLECTOMY N/A 12/27/2012   Procedure: LAPAROSCOPIC  PARTIAL COLECTOMY RIGID PROCTOSCOPY ;  Surgeon: Adin Hector, MD;  Location: [facility];  Service: General;  Laterality: N/A;   LAPAROSCOPIC SIGMOID COLECTOMY  12/27/2012   TONSILLECTOMY AND ADENOIDECTOMY       Current Outpatient Medications  Medication Sig Dispense Refill   allopurinol (ZYLOPRIM) 300 MG tablet Take 300 mg by mouth 2 (two) times daily.      Ascorbic Acid (VITAMIN C) 1000 MG tablet Take 500 mg by mouth daily.      aspirin 81 MG EC tablet Take 81 mg by  mouth daily.      calcium citrate (CALCITRATE - DOSED IN MG ELEMENTAL CALCIUM) 950 MG tablet Take 1 tablet by mouth daily.     Cholecalciferol (VITAMIN D3) 10000 UNITS capsule Take 10,000 Units by mouth daily. 10000units 3x per week, 5000units 4x per week.     finasteride (PROSCAR) 5 MG tablet Take 5 mg by mouth daily.     magnesium oxide (MAG-OX) 400 MG tablet Take 400 mg by mouth daily.     Multiple Vitamin (MULTIVITAMIN WITH MINERALS) TABS Take 1 tablet by mouth daily.     OVER THE COUNTER MEDICATION 1 tablet daily. Trunature Prostate Health     psyllium (METAMUCIL) 58.6 % packet Take 1 packet by mouth daily.     PURE L-ARGININE HCL PO Take 1,800 mg by  mouth.     vitamin B-12 (CYANOCOBALAMIN) 500 MCG tablet Take 500 mcg by mouth daily.     zinc gluconate 50 MG tablet Take 30 mg by mouth daily.      No current facility-administered medications for this visit.    Allergies:   Crestor [rosuvastatin], Exforge [amlodipine besylate-valsartan], and Simvastatin    Social History:  The patient  reports that he has never smoked. He has never used smokeless tobacco. He reports that he does not drink alcohol and does not use drugs.   Family History:  The patient's family history includes CAD in his mother; Cancer in his sister; Diabetes in his father and sister; Heart attack in his mother; Parkinson's disease in his father.    ROS:  Please see the history of present illness.   Otherwise, review of systems are positive for [facility] when he turns over in bed.   All other systems are reviewed and negative.    PHYSICAL EXAM: VS:  BP (!) 150/90    Pulse 83    Ht 5' 10.5" (1.791 m)    Wt 197 lb 12.8 oz (89.7 kg)    SpO2 96%    BMI 27.98 kg/m  , BMI Body mass index is 27.98 kg/m. GEN: Well nourished, well developed, in no acute distress HEENT: normal Neck: no JVD, carotid bruits, or masses Cardiac: RRR; 3/6 murmurs, no rubs, or gallops,no edema  Respiratory:  clear to auscultation bilaterally, normal work of breathing GI: soft, nontender, nondistended, + BS MS: no deformity or atrophy Skin: warm and dry, no rash Neuro:  Strength and sensation are intact Psych: euthymic mood, full affect   EKG:   The ekg ordered today demonstrates normal sinus rhythm, LVH   Recent Labs: No results found for requested labs within last 8760 hours.   Lipid Panel No results found for: CHOL, TRIG, HDL, CHOLHDL, VLDL, LDLCALC, LDLDIRECT   Other studies Reviewed: Additional studies/ records that were reviewed today with results demonstrating: .   ASSESSMENT AND PLAN:  Aortic stenosis/ dilated thoracic aorta: Valve area now 1 cm.  Still without symptoms of  aortic stenosis.  Denies chest discomfort, lightheadedness/syncope, heart failure symptoms.  However, this is likely to progress.  He has questions about SAVR vs. TAVR.  Given his ascending aorta dilatation, I am not sure if he would be a candidate for TAVR.  Will refer to Dr. Cyndia Bent.  I did explain to him that before any TAVR procedure, he would need left and right heart catheterization. Hypertension: He has been intolerant of Exforge in the past.  We did discuss that there is a small study showing ACE inhibitor's can slow the progression of aortic stenosis.  His blood pressure would tolerate and I believe.  Repeat blood pressure today was 156/82.  He declined ACE inhibitor today. Hyperlipidemia: He has been intolerant of statins.  I explained to him that there is a small study showing cholesterol-lowering medicines can also slow the progression of aortic stenosis.  Due to his prior side effects, he is not interested in trying this medication. I counseled him to watch for angina, heart failure symptoms, lightheadedness or syncope.  This could be a sign that his aortic stenosis is worsening.  We also discussed the importance of blood pressure control for his aneurysm. CKD: Avoid nephrotoxins.   Current medicines are reviewed at length with the patient today.  The patient concerns regarding his medicines were addressed.  The following changes have been made:  No change  Labs/ tests ordered today include:  No orders of the defined types were placed in this encounter.   Recommend 150 minutes/week of aerobic exercise Low fat, low carb, high fiber diet recommended  Disposition:   FU in 6 months   Signed, Larae Grooms, MD  07/11/2021 9:57 AM    [facility] [place], [place],   [address] Phone: [phone]; Fax: [phone]

## 2021-07-17 ENCOUNTER — Telehealth: Payer: Self-pay | Admitting: *Deleted

## 2021-07-17 DIAGNOSIS — I77819 Aortic ectasia, unspecified site: Secondary | ICD-10-CM

## 2021-07-17 DIAGNOSIS — I350 Nonrheumatic aortic (valve) stenosis: Secondary | ICD-10-CM

## 2021-07-17 NOTE — Telephone Encounter (Signed)
Patient is unavailable.  I spoke with his wife and she would like for patient to proceed with CTA.  To be done at [place].  Patient will stop by our office for BMP on 12/27

## 2021-07-17 NOTE — Addendum Note (Signed)
Addended by: Thompson Grayer on: 07/17/2021 09:49 AM   Modules accepted: Orders

## 2021-07-17 NOTE — Telephone Encounter (Signed)
[name], MD  Thompson Grayer, RN OK with me.        Previous Messages   ----- Message -----  From: Drew Adams  Sent: 07/14/2021   4:34 PM EST  To: [name], MD, *   This patient needs a CTA Chest aorta scheduled before his appointment with Dr Cyndia Bent on 1/6. Please have your office to schedule. Thanks  Cindy  TCTS  ----- Message -----  From: Laury Deep, RN  Sent: 07/14/2021   3:21 PM EST  To: Barkley Boards, RN, Drew Adams, *   Cindy, please schedule him to see [facility] 1/6 @ 3p.  Referring office needs to get the CTA chest aorta prior to [place] seeing him.   Thanks,  [employer]

## 2021-07-17 NOTE — Telephone Encounter (Signed)
Scan to be done at [facility]

## 2021-07-22 ENCOUNTER — Other Ambulatory Visit: Payer: PPO | Admitting: *Deleted

## 2021-07-22 DIAGNOSIS — I350 Nonrheumatic aortic (valve) stenosis: Secondary | ICD-10-CM | POA: Diagnosis not present

## 2021-07-22 DIAGNOSIS — I77819 Aortic ectasia, unspecified site: Secondary | ICD-10-CM

## 2021-07-22 LAB — BASIC METABOLIC PANEL
BUN/Creatinine Ratio: 17 (ref 10–24)
BUN: 27 mg/dL (ref 8–27)
CO2: 26 mmol/L (ref 20–29)
Calcium: 9.4 mg/dL (ref 8.6–10.2)
Chloride: 101 mmol/L (ref 96–106)
Creatinine, Ser: 1.57 mg/dL — ABNORMAL HIGH (ref 0.76–1.27)
Glucose: 111 mg/dL — ABNORMAL HIGH (ref 70–99)
Potassium: 4.2 mmol/L (ref 3.5–5.2)
Sodium: 144 mmol/L (ref 134–144)
eGFR: 45 mL/min/{1.73_m2} — ABNORMAL LOW (ref >59)

## 2021-07-23 ENCOUNTER — Other Ambulatory Visit: Payer: Self-pay

## 2021-07-23 ENCOUNTER — Ambulatory Visit (HOSPITAL_BASED_OUTPATIENT_CLINIC_OR_DEPARTMENT_OTHER)
Admission: RE | Admit: 2021-07-23 | Discharge: 2021-07-23 | Disposition: A | Discharge status: Home / Self Care | Payer: PPO | Source: Ambulatory Visit | Attending: Interventional Cardiology | Admitting: Interventional Cardiology

## 2021-07-23 DIAGNOSIS — I77819 Aortic ectasia, unspecified site: Secondary | ICD-10-CM | POA: Insufficient documentation

## 2021-07-23 DIAGNOSIS — I7121 Aneurysm of the ascending aorta, without rupture: Secondary | ICD-10-CM | POA: Diagnosis not present

## 2021-07-23 DIAGNOSIS — I350 Nonrheumatic aortic (valve) stenosis: Secondary | ICD-10-CM | POA: Insufficient documentation

## 2021-07-23 DIAGNOSIS — J9811 Atelectasis: Secondary | ICD-10-CM | POA: Diagnosis not present

## 2021-07-23 MED ORDER — IOHEXOL 350 MG/ML SOLN
60.0000 mL | Freq: Once | INTRAVENOUS | Status: AC | PRN
  Administered 2021-07-23: 13:00:00 60 mL via INTRAVENOUS

## 2021-07-30 DIAGNOSIS — M1A9XX1 Chronic gout, unspecified, with tophus (tophi): Secondary | ICD-10-CM | POA: Diagnosis not present

## 2021-07-30 DIAGNOSIS — M2550 Pain in unspecified joint: Secondary | ICD-10-CM | POA: Diagnosis not present

## 2021-07-30 DIAGNOSIS — Z79899 Other long term (current) drug therapy: Secondary | ICD-10-CM | POA: Diagnosis not present

## 2021-07-30 DIAGNOSIS — Z6826 Body mass index (BMI) 26.0-26.9, adult: Secondary | ICD-10-CM | POA: Diagnosis not present

## 2021-07-30 DIAGNOSIS — E663 Overweight: Secondary | ICD-10-CM | POA: Diagnosis not present

## 2021-08-01 ENCOUNTER — Institutional Professional Consult (permissible substitution): Payer: PPO | Admitting: Surgery

## 2021-08-01 ENCOUNTER — Other Ambulatory Visit: Payer: Self-pay

## 2021-08-01 VITALS — Resp 20 | Ht 70.5 in | Wt 197.0 lb

## 2021-08-01 DIAGNOSIS — I350 Nonrheumatic aortic (valve) stenosis: Secondary | ICD-10-CM | POA: Diagnosis not present

## 2021-08-01 DIAGNOSIS — I7121 Aneurysm of the ascending aorta, without rupture: Secondary | ICD-10-CM

## 2021-08-03 ENCOUNTER — Encounter: Payer: Self-pay | Admitting: Surgery

## 2021-08-03 NOTE — Progress Notes (Signed)
Cardiothoracic Surgery Consultation  PCP is Josetta Huddle, MD Referring Provider is [name], MD  Chief Complaint  Patient presents with   Aortic Stenosis    Surgical consult, ECHO 07/04/21, CTA Chest 07/23/21, No Cardiac Cath completed    HPI:  The patient is a 81 year old gentleman with a hx of hypertension, hypercholesterolemia, gout and aortic stenosis with an ascending aortic aneurysm that has been followed by Dr. Irish Lack since 2015. His most recent echo on 07/04/2021 showed a calcified and thickened aortic valve that looks bicuspid to me with poor leaflet mobility and a mean gradient of 33 mm Hg, peak of 58 mm Hg and AVA of 1 cm2. DI is 0.28 consistent with moderate to severe AS. There is moderate AI with a PHT of 378 msec. LVEF is 60-65% with mild LVH and stable normal LV internal dimensions. There is moderate MR which is new from echo 1 year ago and the mitral valve has been degenerative and calcified, particularly the posterior leaflet and annulus. His echo also showed a 4.8 cm fusiform ascending aortic aneurysm.  It was measured at 5.0 cm on CTA but I think that was not an accurate measurement and I personally measured it at 4.8 cm in the mid ascending aorta. He has continued to walk at least a mile per day at a 4 mph pace and remains asymptomatic. He has not noticed any change in his stamina. He is here today with his wife. There is no family history of aortic valve disease, aortic aneurysm or dissection, or connective tissue disease.   Past Medical History:  Diagnosis Date   BPH (benign prostatic hyperplasia)    Cardiac murmur    Cardiomegaly    Diverticulosis of colon without hemorrhage 11/28/2012   ED (erectile dysfunction)    Elevated blood pressure reading    Elevated PSA    Gout    uses prednisone 1-2x/week   Hypercholesterolemia    Hypertension    Iron deficiency anemia 11/28/2012   Mixed hyperlipidemia    Ocular migraine    Other fecal abnormalities     Other specified diseases of intestine    Pain in unspecified joint    Paraesophageal hiatal hernia 11/28/2012   Radiculopathy    Ulnar nerve abnormality    Ventricular hypertrophy     Past Surgical History:  Procedure Laterality Date   CATARACT EXTRACTION Right 2012   CATARACT EXTRACTION Left Jan 2014   HERNIA REPAIR Right 1970s   open RIH repair   LAPAROSCOPIC PARTIAL COLECTOMY N/A 12/27/2012   Procedure: LAPAROSCOPIC  PARTIAL COLECTOMY RIGID PROCTOSCOPY ;  Surgeon: Adin Hector, MD;  Location: [facility];  Service: General;  Laterality: N/A;   LAPAROSCOPIC SIGMOID COLECTOMY  12/27/2012   TONSILLECTOMY AND ADENOIDECTOMY      Family History  Problem Relation Age of Onset   Heart attack Mother    CAD Mother    Parkinson's disease Father    Diabetes Father    Cancer Sister    Diabetes Sister     Social History Social History   Tobacco Use   Smoking status: Never   Smokeless tobacco: Never  Vaping Use   Vaping Use: Never used  Substance Use Topics   Alcohol use: No   Drug use: No    Current Outpatient Medications  Medication Sig Dispense Refill   allopurinol (ZYLOPRIM) 300 MG tablet Take 300 mg by mouth 2 (two) times daily.      Ascorbic Acid (VITAMIN  C) 1000 MG tablet Take 500 mg by mouth daily.      aspirin 81 MG EC tablet Take 81 mg by mouth daily.      calcium citrate (CALCITRATE - DOSED IN MG ELEMENTAL CALCIUM) 950 MG tablet Take 1 tablet by mouth daily.     Cholecalciferol (VITAMIN D3) 10000 UNITS capsule Take 10,000 Units by mouth daily. 10000units 3x per week, 5000units 4x per week.     finasteride (PROSCAR) 5 MG tablet Take 5 mg by mouth daily.     magnesium oxide (MAG-OX) 400 MG tablet Take 400 mg by mouth daily.     Multiple Vitamin (MULTIVITAMIN WITH MINERALS) TABS Take 1 tablet by mouth daily.     OVER THE COUNTER MEDICATION 1 tablet daily. Trunature Prostate Health     psyllium (METAMUCIL) 58.6 % packet Take 1 packet by mouth daily.     PURE L-ARGININE HCL  PO Take 1,800 mg by mouth.     vitamin B-12 (CYANOCOBALAMIN) 500 MCG tablet Take 500 mcg by mouth daily.     zinc gluconate 50 MG tablet Take 30 mg by mouth daily.      No current facility-administered medications for this visit.    Allergies  Allergen Reactions   Crestor [Rosuvastatin] Other (See Comments)    Leg pain and cramping   Exforge [Amlodipine Besylate-Valsartan]    Simvastatin     Review of Systems  Constitutional:  Negative for activity change, fatigue and unexpected weight change.  HENT: Negative.         Sees dentist regularly and no problems with his teeth.  Eyes: Negative.   Respiratory:  Negative for chest tightness and shortness of breath.   Cardiovascular:  Negative for chest pain.  Gastrointestinal: Negative.   Endocrine: Negative.   Genitourinary: Negative.   Musculoskeletal: Negative.   Skin: Negative.   Allergic/Immunologic: Negative.   Neurological:  Negative for dizziness, syncope and light-headedness.  Hematological: Negative.   Psychiatric/Behavioral: Negative.     Resp 20    Ht 5' 10.5" (1.791 m)    Wt 197 lb (89.4 kg)    SpO2 94% Comment: RA   BMI 27.87 kg/m  Physical Exam Constitutional:      Appearance: Normal appearance. He is normal weight.  HENT:     Head: Normocephalic and atraumatic.     Mouth/Throat:     Mouth: Mucous membranes are moist.     Pharynx: Oropharynx is clear.  Eyes:     Extraocular Movements: Extraocular movements intact.     Pupils: Pupils are equal, round, and reactive to light.  Cardiovascular:     Rate and Rhythm: Normal rate and regular rhythm.     Pulses: Normal pulses.     Heart sounds: Murmur heard.     Comments: 3/6 systolic murmur RSB. No diastolic murmur. Pulmonary:     Effort: Pulmonary effort is normal.     Breath sounds: Normal breath sounds.  Abdominal:     General: There is no distension.     Tenderness: There is no abdominal tenderness.  Musculoskeletal:        General: No swelling. Normal  range of motion.     Cervical back: Normal range of motion and neck supple.  Skin:    General: Skin is warm and dry.  Neurological:     General: No focal deficit present.     Mental Status: He is alert and oriented to person, place, and time.  Psychiatric:  Mood and Affect: Mood normal.        Behavior: Behavior normal.     Diagnostic Tests:  ECHOCARDIOGRAM REPORT         Patient Name:   Drew Adams  Date of Exam: 07/04/2021  Medical Rec #:  [id number]     Height:       70.5 in  Accession #:    [id number]    Weight:       183.0 lb  Date of Birth:  Jul 26, 1941     BSA:          2.021 m  Patient Age:    48 years      BP:           160/80 mmHg  Patient Gender: M             HR:           88 bpm.  Exam Location:  [facility]   Procedure: 2D Echo, 3D Echo, Cardiac Doppler, Color Doppler and Strain  Analysis   Indications:    I35 Aortic stenosis     History:        Patient has prior history of Echocardiogram examinations,  most                  recent 06/05/2020. TIA; Aortic Valve Disease. CKD stage 2.                  Anemia.     Sonographer:    Basilia Jumbo BS, RDCS  Referring Phys: Lawrence     1. Global longitudinal strain is -18.7%. Basal inferior hypokinesis. .  Left ventricular ejection fraction, by estimation, is 60 to 65%. The left  ventricle has normal function. The left ventricle has no regional wall  motion abnormalities. There is mild  left ventricular hypertrophy. Left ventricular diastolic parameters are  consistent with Grade I diastolic dysfunction (impaired relaxation).   2. Right ventricular systolic function is normal. The right ventricular  size is normal.   3. Moderate mitral valve regurgitation.   4. AV is thickened, calcified with restricted motion Peak and mean  gradients through the valve aer 58 and 33 mm Hg respectively AVA (VTI) is  1 cm2. Dimensionless index is 0.28 consistent with moderate to severe AS .   Aortic valve regurgitation is  moderate.   5. Aortic dilatation noted. There is mild dilatation of the aortic root,  measuring 38 mm. There is severe dilatation of the ascending aorta,  measuring 48 mm.   Comparison(s): 06/05/20 EF 60-65%. Moderate -severe AS 36mmHg mean PG,  103mmHg peak PG.   FINDINGS   Left Ventricle: Global longitudinal strain is -18.7%. Basal inferior  hypokinesis. Left ventricular ejection fraction, by estimation, is 60 to  65%. The left ventricle has normal function. The left ventricle has no  regional wall motion abnormalities. The  left ventricular internal cavity size was normal in size. There is mild  left ventricular hypertrophy. Left ventricular diastolic parameters are  consistent with Grade I diastolic dysfunction (impaired relaxation).   Right Ventricle: The right ventricular size is normal. Right vetricular  wall thickness was not assessed. Right ventricular systolic function is  normal.   Left Atrium: Left atrial size was normal in size.   Right Atrium: Right atrial size was normal in size.   Pericardium: There is no evidence of pericardial effusion.   Mitral Valve: There is mild thickening of the mitral  valve leaflet(s).  Mild to moderate mitral annular calcification. Moderate mitral valve  regurgitation.   Tricuspid Valve: The tricuspid valve is normal in structure. Tricuspid  valve regurgitation is trivial.   Aortic Valve: AV is thickened, calcified with restricted motion Peak and  mean gradients through the valve aer 58 and 33 mm Hg respectively AVA  (VTI) is 1 cm2. Dimensionless index is 0.28 consistent with moderate to  severe AS. Aortic valve regurgitation  is moderate. Aortic regurgitation PHT measures 378 msec. Aortic valve mean  gradient measures 35.0 mmHg. Aortic valve peak gradient measures 58.4  mmHg. Aortic valve area, by VTI measures 0.96 cm.   Pulmonic Valve: The pulmonic valve was normal in structure. Pulmonic valve   regurgitation is mild.   Aorta: Aortic dilatation noted. There is mild dilatation of the aortic  root, measuring 38 mm. There is severe dilatation of the ascending aorta,  measuring 48 mm.   IAS/Shunts: No atrial level shunt detected by color flow Doppler.      LEFT VENTRICLE  PLAX 2D  LVIDd:         4.80 cm   Diastology  LVIDs:         3.40 cm   LV e' medial:    5.10 cm/s  LV PW:         1.20 cm   LV E/e' medial:  13.3  LV IVS:        1.20 cm   LV e' lateral:   7.50 cm/s  LVOT diam:     2.10 cm   LV E/e' lateral: 9.0  LV SV:         77  LV SV Index:   38        2D Longitudinal Strain  LVOT Area:     3.46 cm  2D Strain GLS (A2C):   -17.5 %                           2D Strain GLS (A3C):   -20.1 %                           2D Strain GLS (A4C):   -18.5 %                           2D Strain GLS Avg:     -18.7 %                              3D Volume EF:                           3D EF:        50 %                           LV EDV:       161 ml                           LV ESV:       81 ml                           LV SV:  81 ml   RIGHT VENTRICLE             IVC  RV Basal diam:  3.40 cm     IVC diam: 1.80 cm  RV S prime:     12.00 cm/s  TAPSE (M-mode): 2.0 cm   LEFT ATRIUM             Index        RIGHT ATRIUM           Index  LA diam:        4.10 cm 2.03 cm/m   RA Pressure: 3.00 mmHg  LA Vol (A2C):   67.0 ml 33.16 ml/m  RA Area:     16.70 cm  LA Vol (A4C):   51.5 ml 25.49 ml/m  RA Volume:   43.30 ml  21.43 ml/m  LA Biplane Vol: 60.9 ml 30.14 ml/m   AORTIC VALVE  AV Area (Vmax):    0.93 cm  AV Area (Vmean):   0.89 cm  AV Area (VTI):     0.96 cm  AV Vmax:           382.00 cm/s  AV Vmean:          274.750 cm/s  AV VTI:            0.795 m  AV Peak Grad:      58.4 mmHg  AV Mean Grad:      35.0 mmHg  LVOT Vmax:         103.00 cm/s  LVOT Vmean:        70.700 cm/s  LVOT VTI:          0.221 m  LVOT/AV VTI ratio: 0.28  AI PHT:            378 msec     AORTA   Ao Root diam: 3.80 cm  Ao Asc diam:  4.65 cm   MITRAL VALVE                  TRICUSPID VALVE                                Estimated RAP:  3.00 mmHg  MV Decel Time: 257 msec  MR Peak grad:    179.0 mmHg   SHUNTS  MR Mean grad:    104.0 mmHg   Systemic VTI:  0.22 m  MR Vmax:         669.00 cm/s  Systemic Diam: 2.10 cm  MR Vmean:        475.0 cm/s  MR PISA:         3.08 cm  MR PISA Eff ROA: 15 mm  MR PISA Radius:  0.70 cm  MV E velocity: 67.60 cm/s  MV A velocity: 134.00 cm/s  MV E/A ratio:  0.50   Dorris Carnes MD  Electronically signed by Dorris Carnes MD  Signature Date/Time: 07/04/2021/5:30:06 PM         Final     Narrative & Impression  CLINICAL DATA:  Aortic aneurysm suspected. Reported progressive aortic stenosis and aortic dilation since 2015   EXAM: CT ANGIOGRAPHY CHEST WITH CONTRAST   TECHNIQUE: Multidetector CT imaging of the chest was performed using the standard protocol during bolus administration of intravenous contrast. Multiplanar CT image reconstructions and MIPs were obtained to evaluate the vascular anatomy.   CONTRAST:  25mL OMNIPAQUE IOHEXOL 350 MG/ML SOLN  COMPARISON:  None.   FINDINGS: CARDIOVASCULAR:   Limitations by motion: Moderate   Preferential opacification of the thoracic aorta. No evidence of thoracic aortic dissection.   Aortic Root: Difficult to assess, measuring proximally   --Valve: 3.0 cm   --Sinuses: 3.6 cm   --Sinotubular Junction: 3.2 cm   Thoracic Aorta:   --Ascending Aorta: 5.0 cm   --Aortic Arch: 3.5 cm   --Descending Aorta: 3.2 cm   LEFT aortic arch and "bovine" normal-variant, with common origins of the innominate and LEFT common carotid arteries.   Moderate calcified and noncalcified atheromatous disease at the origin of the LEFT subclavian artery, without significant stenosis.   Other:   Normal heart size. No pericardial effusion. A severe burden of aortic, mitral valve and multivessel coronary  artery calcifications are present.   Mediastinum/Nodes: No enlarged mediastinal, hilar, or axillary lymph nodes. Thyroid gland, trachea, and esophagus demonstrate no significant findings.   Lungs/Pleura: Trace bibasilar atelectasis. Lungs are otherwise clear. No pleural effusion or pneumothorax.   Upper Abdomen: No acute abnormality.   Musculoskeletal: No chest wall abnormality. Punctate sclerotic focus at the RIGHT lateral 8th rib. No acute osseous findings.   Review of the MIP images confirms the above findings.   IMPRESSION: 1. 5.0 cm ascending thoracic aortic aneurysm. Ascending thoracic aortic aneurysm. Continue semi-annual imaging followup by CTA or MRA and referral to cardiothoracic surgery if not already obtained. This recommendation follows 2010 ACCF/AHA/AATS/ACR/ASA/SCA/SCAI/SIR/STS/SVM Guidelines for the Diagnosis and Management of Patients With Thoracic Aortic Disease. Circulation. 2010; 121: N397-Q734. Aortic aneurysm NOS (ICD10-I71.9) 2. Severe burden of aortic, mitral valve and multivessel coronary artery calcifications. 3. Incidental, punctate sclerotic focus of the RIGHT lateral eighth rib, most commonly a fibro-osseous lesion.     Electronically Signed   By: Michaelle Birks M.D.   On: 07/23/2021 14:09   Impression:  This 81 year old gentleman has what appears to be a bicuspid aortic valve that is severely calcified and thickened with restricted mobility with moderate to severe stenosis by echo criteria with a mean gradient of 33 mm Hg, DI 0.28 and AVA of 1 cm2. There is moderate AI and moderate MR with a calcified degenerative mitral valve and a 4.8 cm fusiform ascending aortic aneurysm.  His EF is normal and LV internal dimensions are normal. He is asymptomatic and remains active walking every day. I reviewed all of the symptoms of AS/AI/MR with him and wife and his activity level and I think he really is asymptomatic at this time. I don't think there is any  definite indication for aortic valve replacement at this time but I suspect that he will develop symptoms or signs of LV strain in the not too distant future especially with coexisting moderate AI and moderate MR and should be followed closely with a repeat echo in 6 months. If he develops any symptoms then we should proceed with aortic valve replacement. I think the complicated decision will be whether to do TAVR or open surgical AVR. The only reason to do open surgical AVR would be to replace his mitral valve and probably his ascending aortic aneurysm at the same time or if TAVR workup revealed that his anatomy was not suitable for TAVR. This would be a very large, higher risk surgery for a 40+ year old patient with stage 3A chronic kidney disease. If TAVR is done then he will still have moderate MR and a 4.8 cm ascending aortic aneurysm that could potentially cause problems down the road. His MR  may get better with AVR and relief of ventricular outflow obstruction and the aneurysm could stay stable for many years. TAVR would certainly have lower risk for him at his age with kidney disease. I reviewed the echo and CTA images with the patient and his wife and answered all of their questions. We decided to follow him up in six months with a CTA and echo and reevaluate at that time. If he develops any symptoms he will let Dr. Irish Lack and I know so that we can make plans to proceed with treatment of his AS.    Plan:  He will return to see me in 6 months with a CTA chest, abdomen, and pelvis and gated cardiac CTA for TAVR workup and 2D echo. He will contact me immediately if he notices any symptoms that we reviewed today.  I spent 60 minutes performing this consultation and > 50% of this time was spent face to face counseling and coordinating the care of this patient's bicuspid aortic valve stenosis and ascending aortic aneurysm.   Gaye Pollack, MD [facility] [phone]

## 2021-08-04 ENCOUNTER — Other Ambulatory Visit (HOSPITAL_COMMUNITY): Payer: PPO

## 2021-09-16 DIAGNOSIS — R972 Elevated prostate specific antigen [PSA]: Secondary | ICD-10-CM | POA: Diagnosis not present

## 2021-10-21 ENCOUNTER — Other Ambulatory Visit: Payer: Self-pay | Admitting: Gastroenterology

## 2021-10-21 DIAGNOSIS — R195 Other fecal abnormalities: Secondary | ICD-10-CM

## 2021-12-01 ENCOUNTER — Inpatient Hospital Stay: Admission: RE | Admit: 2021-12-01 | Payer: PPO | Source: Ambulatory Visit

## 2021-12-17 ENCOUNTER — Telehealth: Payer: Self-pay

## 2021-12-17 DIAGNOSIS — I77819 Aortic ectasia, unspecified site: Secondary | ICD-10-CM

## 2021-12-17 DIAGNOSIS — I350 Nonrheumatic aortic (valve) stenosis: Secondary | ICD-10-CM

## 2021-12-17 NOTE — Telephone Encounter (Signed)
Left message for the pt to contact the office in regards to arranging Echo and TAVR CTs (chest/abd/pelvis/heart) per 08/01/21 visit with Dr Cyndia Bent.  The pt will need to have testing completed before arranging a follow-up appointment with Dr Cyndia Bent. I will need to determine if the patient has any vacations planned in July, but, at this time I have held an appointment for the pt to see Dr Cyndia Bent on 7/19.

## 2021-12-24 ENCOUNTER — Other Ambulatory Visit: Payer: Self-pay

## 2021-12-24 DIAGNOSIS — I350 Nonrheumatic aortic (valve) stenosis: Secondary | ICD-10-CM

## 2021-12-24 DIAGNOSIS — N182 Chronic kidney disease, stage 2 (mild): Secondary | ICD-10-CM

## 2021-12-24 MED ORDER — METOPROLOL TARTRATE 50 MG PO TABS: ORAL_TABLET | ORAL | 0 refills | Status: DC

## 2022-01-02 ENCOUNTER — Other Ambulatory Visit: Payer: PPO

## 2022-01-02 DIAGNOSIS — I350 Nonrheumatic aortic (valve) stenosis: Secondary | ICD-10-CM | POA: Diagnosis not present

## 2022-01-02 DIAGNOSIS — N182 Chronic kidney disease, stage 2 (mild): Secondary | ICD-10-CM

## 2022-01-03 LAB — BASIC METABOLIC PANEL
BUN/Creatinine Ratio: 15 (ref 10–24)
BUN: 21 mg/dL (ref 8–27)
CO2: 23 mmol/L (ref 20–29)
Calcium: 10.1 mg/dL (ref 8.6–10.2)
Chloride: 101 mmol/L (ref 96–106)
Creatinine, Ser: 1.37 mg/dL — ABNORMAL HIGH (ref 0.76–1.27)
Glucose: 86 mg/dL (ref 70–99)
Potassium: 4.6 mmol/L (ref 3.5–5.2)
Sodium: 144 mmol/L (ref 134–144)
eGFR: 52 mL/min/{1.73_m2} — ABNORMAL LOW (ref >59)

## 2022-01-09 ENCOUNTER — Ambulatory Visit (HOSPITAL_COMMUNITY)
Admission: RE | Admit: 2022-01-09 | Discharge: 2022-01-09 | Disposition: A | Discharge status: Home / Self Care | Payer: PPO | Source: Ambulatory Visit | Attending: Surgery | Admitting: Surgery

## 2022-01-09 ENCOUNTER — Ambulatory Visit (HOSPITAL_BASED_OUTPATIENT_CLINIC_OR_DEPARTMENT_OTHER)
Admission: RE | Admit: 2022-01-09 | Discharge: 2022-01-09 | Disposition: A | Discharge status: Home / Self Care | Payer: PPO | Source: Ambulatory Visit | Attending: Surgery | Admitting: Surgery

## 2022-01-09 DIAGNOSIS — I358 Other nonrheumatic aortic valve disorders: Secondary | ICD-10-CM | POA: Diagnosis not present

## 2022-01-09 DIAGNOSIS — I350 Nonrheumatic aortic (valve) stenosis: Secondary | ICD-10-CM | POA: Insufficient documentation

## 2022-01-09 DIAGNOSIS — I77819 Aortic ectasia, unspecified site: Secondary | ICD-10-CM | POA: Diagnosis not present

## 2022-01-09 DIAGNOSIS — I7121 Aneurysm of the ascending aorta, without rupture: Secondary | ICD-10-CM | POA: Diagnosis not present

## 2022-01-09 DIAGNOSIS — K5730 Diverticulosis of large intestine without perforation or abscess without bleeding: Secondary | ICD-10-CM | POA: Diagnosis not present

## 2022-01-09 LAB — ECHOCARDIOGRAM COMPLETE
AR max vel: 2.16 cm2
AV Area VTI: 2.36 cm2
AV Area mean vel: 2.2 cm2
AV Mean grad: 23.3 mmHg
AV Peak grad: 41.9 mmHg
Ao pk vel: 3.24 m/s
Area-P 1/2: 4.19 cm2
MV M vel: 6.14 m/s
MV Peak grad: 150.8 mmHg
S' Lateral: 3.6 cm

## 2022-01-09 MED ORDER — IOHEXOL 350 MG/ML SOLN
100.0000 mL | Freq: Once | INTRAVENOUS | Status: AC | PRN
  Administered 2022-01-09: 100 mL via INTRAVENOUS

## 2022-02-11 ENCOUNTER — Ambulatory Visit: Payer: PPO | Admitting: Surgery

## 2022-02-13 ENCOUNTER — Ambulatory Visit: Payer: PPO | Admitting: Surgery

## 2022-02-13 ENCOUNTER — Encounter: Payer: Self-pay | Admitting: Surgery

## 2022-02-13 VITALS — BP 173/74 | HR 83 | Resp 18 | Ht 70.0 in | Wt 191.0 lb

## 2022-02-13 DIAGNOSIS — I350 Nonrheumatic aortic (valve) stenosis: Secondary | ICD-10-CM | POA: Diagnosis not present

## 2022-02-13 NOTE — Progress Notes (Signed)
HPI:  The patient is a 81 year old gentleman who has what appears to be a bicuspid aortic valve that is severely calcified and thickened.  He had an echo on 07/04/2021 showing a mean gradient of 33 mmHg and a peak gradient of 58 mmHg with a valve area of 1.0 cm by VTI and a dimensionless index of 0.28 consistent with moderate to severe aortic stenosis.  He also had moderate aortic insufficiency.  Left ventricular ejection fraction was 60 to 65%.  He was feeling well when I saw him in January without any symptoms at all and was quite active.  We decided to continue following his aortic stenosis with a repeat echocardiogram in 6 months as well as TAVR CT work-up.  Since I saw him in January he continues to feel well.  He is walking daily without any chest pain or shortness of breath.  He has no fatigue with exertion.  He denies any dizziness or syncope.  Has had no peripheral edema or orthopnea.  He said he feels fine.  His wife is with him today and has not noticed any changes in his activity.  Current Outpatient Medications  Medication Sig Dispense Refill   allopurinol (ZYLOPRIM) 300 MG tablet Take 300 mg by mouth 2 (two) times daily.      Ascorbic Acid (VITAMIN C) 1000 MG tablet Take 500 mg by mouth daily.      aspirin 81 MG EC tablet Take 81 mg by mouth daily.      calcium citrate (CALCITRATE - DOSED IN MG ELEMENTAL CALCIUM) 950 MG tablet Take 1 tablet by mouth daily. '600mg'$  3 times a week     Cholecalciferol (VITAMIN D3) 10000 UNITS capsule Take 10,000 Units by mouth daily. 10000units 3x per week, 5000units 4x per week.     finasteride (PROSCAR) 5 MG tablet Take 5 mg by mouth daily.     magnesium oxide (MAG-OX) 400 MG tablet Take 400 mg by mouth daily.     metoprolol tartrate (LOPRESSOR) 50 MG tablet Take one tablet by mouth as directed prior to CT scan 1 tablet 0   Multiple Vitamin (MULTIVITAMIN WITH MINERALS) TABS Take 1 tablet by mouth daily.     OVER THE COUNTER MEDICATION 1 tablet daily.  Trunature Prostate Health     psyllium (METAMUCIL) 58.6 % packet Take 1 packet by mouth daily.     PURE L-ARGININE HCL PO Take 1,800 mg by mouth.     vitamin B-12 (CYANOCOBALAMIN) 500 MCG tablet Take 500 mcg by mouth daily.     zinc gluconate 50 MG tablet Take 30 mg by mouth daily.      No current facility-administered medications for this visit.     Physical Exam: BP (!) 173/74 (BP Location: Left Arm, Patient Position: Sitting)   Pulse 83   Resp 18   Ht '5\' 10"'$  (1.778 m)   Wt 191 lb (86.6 kg)   SpO2 93% Comment: RA  BMI 27.41 kg/m  He looks well. Cardiac exam shows regular rate and rhythm with a 3/6 systolic murmur along the right sternal border.  There is no diastolic murmur. Lungs are clear. There is no peripheral edema.  Diagnostic Tests:    ECHOCARDIOGRAM REPORT         Patient Name:   KAYSTON JODOIN Date of Exam: 01/09/2022  Medical Rec #:  [id number]    Height:       70.5 in  Accession #:    [id number]   Weight:  197.0 lb  Date of Birth:  Mar 25, 1941    BSA:          2.085 m  Patient Age:    6 years     BP:           158/68 mmHg  Patient Gender: M            HR:           64 bpm.  Exam Location:  Outpatient   Procedure: 2D Echo, Cardiac Doppler and Color Doppler   Indications:    Aortic stenosis     History:        Patient has prior history of Echocardiogram examinations,  most                  recent 07/04/2021.     Sonographer:    Jefferey Pica  Referring Phys: [place]     1. Left ventricular ejection fraction, by estimation, is 55 to 60%. The  left ventricle has normal function. The left ventricle has no regional  wall motion abnormalities. There is moderate concentric left ventricular  hypertrophy. Left ventricular  diastolic parameters are consistent with Grade I diastolic dysfunction  (impaired relaxation).   2. Right ventricular systolic function is normal. The right ventricular  size is normal. Tricuspid  regurgitation signal is inadequate for assessing  PA pressure.   3. Left atrial size was moderately dilated.   4. The mitral valve is grossly normal. Mild to moderate mitral valve  regurgitation. No evidence of mitral stenosis. Severe mitral annular  calcification.   5. The aortic valve is calcified. There is moderate calcification of the  aortic valve. There is moderate thickening of the aortic valve. Aortic  valve regurgitation is moderate. Moderate to severe aortic valve stenosis.  Aortic valve mean gradient  measures 23.3 mmHg. Aortic valve Vmax measures 3.24 m/s.   6. Aortic dilatation noted. There is mild dilatation of the ascending  aorta, measuring 44 mm.   7. The inferior vena cava is normal in size with greater than 50%  respiratory variability, suggesting right atrial pressure of 3 mmHg.   Comparison(s): Prior images reviewed side by side.   FINDINGS   Left Ventricle: Left ventricular ejection fraction, by estimation, is 55  to 60%. The left ventricle has normal function. The left ventricle has no  regional wall motion abnormalities. The left ventricular internal cavity  size was normal in size. There is   moderate concentric left ventricular hypertrophy. Left ventricular  diastolic parameters are consistent with Grade I diastolic dysfunction  (impaired relaxation).   Right Ventricle: The right ventricular size is normal. No increase in  right ventricular wall thickness. Right ventricular systolic function is  normal. Tricuspid regurgitation signal is inadequate for assessing PA  pressure.   Left Atrium: Left atrial size was moderately dilated.   Right Atrium: Right atrial size was normal in size.   Pericardium: There is no evidence of pericardial effusion.   Mitral Valve: The mitral valve is grossly normal. There is mild thickening  of the mitral valve leaflet(s). There is mild calcification of the mitral  valve leaflet(s). Severe mitral annular calcification. Mild  to moderate  mitral valve regurgitation. No  evidence of mitral valve stenosis.   Tricuspid Valve: The tricuspid valve is normal in structure. Tricuspid  valve regurgitation is trivial. No evidence of tricuspid stenosis.   Aortic Valve: The aortic valve is calcified. There is moderate  calcification  of the aortic valve. There is moderate thickening of the  aortic valve. Aortic valve regurgitation is moderate. Moderate to severe  aortic stenosis is present. Aortic valve mean  gradient measures 23.3 mmHg. Aortic valve peak gradient measures 41.9  mmHg. Aortic valve area, by VTI measures 2.36 cm.   Pulmonic Valve: The pulmonic valve was grossly normal. Pulmonic valve  regurgitation is trivial. No evidence of pulmonic stenosis.   Aorta: Aortic dilatation noted. There is mild dilatation of the ascending  aorta, measuring 44 mm.   Venous: The inferior vena cava is normal in size with greater than 50%  respiratory variability, suggesting right atrial pressure of 3 mmHg.   IAS/Shunts: The atrial septum is grossly normal.      LEFT VENTRICLE  PLAX 2D  LVIDd:         5.10 cm   Diastology  LVIDs:         3.60 cm   LV e' medial:    4.84 cm/s  LV PW:         1.40 cm   LV E/e' medial:  16.6  LV IVS:        1.40 cm   LV e' lateral:   4.00 cm/s  LVOT diam:     2.00 cm   LV E/e' lateral: 20.0  LV SV:         167  LV SV Index:   80  LVOT Area:     3.14 cm      RIGHT VENTRICLE             IVC  RV Basal diam:  2.90 cm     IVC diam: 1.80 cm  RV S prime:     11.00 cm/s  TAPSE (M-mode): 1.9 cm   LEFT ATRIUM             Index        RIGHT ATRIUM           Index  LA diam:        4.70 cm 2.25 cm/m   RA Area:     15.30 cm  LA Vol (A2C):   88.9 ml 42.64 ml/m  RA Volume:   36.60 ml  17.55 ml/m  LA Vol (A4C):   69.5 ml 33.33 ml/m  LA Biplane Vol: 78.5 ml 37.65 ml/m   AORTIC VALVE                     PULMONIC VALVE  AV Area (Vmax):    2.16 cm      PV Vmax:       0.60 m/s  AV Area (Vmean):    2.20 cm      PV Peak grad:  1.5 mmHg  AV Area (VTI):     2.36 cm  AV Vmax:           323.67 cm/s  AV Vmean:          220.000 cm/s  AV VTI:            0.710 m  AV Peak Grad:      41.9 mmHg  AV Mean Grad:      23.3 mmHg  LVOT Vmax:         223.00 cm/s  LVOT Vmean:        154.300 cm/s  LVOT VTI:          0.532 m  LVOT/AV VTI ratio: 0.75     AORTA  Ao Root diam: 3.80 cm  Ao Asc diam:  4.40 cm   MITRAL VALVE  MV Area (PHT): 4.19 cm     SHUNTS  MV Decel Time: 181 msec     Systemic VTI:  0.53 m  MR Peak grad: 150.8 mmHg    Systemic Diam: 2.00 cm  MR Vmax:      614.00 cm/s  MV E velocity: 80.20 cm/s  MV A velocity: 118.00 cm/s  MV E/A ratio:  0.68   Buford Dresser MD  Electronically signed by Buford Dresser MD  Signature Date/Time: 01/09/2022/11:41:00 AM         Final     ADDENDUM REPORT: 01/09/2022 12:13   CLINICAL DATA:  Aortic stenosis   EXAM: Cardiac TAVR CT   TECHNIQUE: The patient was scanned on a Siemens Force 884 slice scanner. A 120 kV retrospective scan was triggered in the descending thoracic aorta at 111 HU's. Gantry rotation speed was 270 msecs and collimation was .9 mm. No beta blockade or nitro were given. The 3D data set was reconstructed in 5% intervals of the R-R cycle. Systolic and diastolic phases were analyzed on a dedicated work station using MPR, MIP and VRT modes. The patient received 80 cc of contrast.   FINDINGS: Aortic Valve: Severely calcified tri leaflet AV with score 7772 1/3 rd calcium involving annulus and sub valvular intervalvular fibrosa   Aorta: Aneurysmal at 4.6 cm with bovine arch and moderate calcific atherosclerosis   Sinotubular Junction: 28 mm   Ascending Thoracic Aorta: 46 mm   Aortic Arch: 33 mm   Descending Thoracic Aorta: 25 mm   Sinus of Valsalva Measurements:   Non-coronary: 35.1 mm   Right - coronary: 35.1 mm   Left - coronary: 35.8 mm   Coronary Artery Height above Annulus:   Left  Main: 12.3 mm above annulus   Right Coronary: 12.2 mm above annulus   Virtual Basal Annulus Measurements:   Maximum/Minimum Diameter: 31.8 mm x 27.6 mm   Perimeter: 95 mm   Area: 657 mm2   Coronary Arteries: Sufficient height above annulus for deployment   Optimum Fluoroscopic Angle for Delivery: RAO 8 Caudal 24 degrees   IMPRESSION: 1. Heavily calcified tri leaflet AV with score 7772. Significant IVF and sub-annular calcification   2.  Annular area of 657 mm2 suitable for a 29 mm Sapien 3 valve   3. Optimum angiographic angle for deployment RAO 8 Caudal 24 degrees   4.  Coronary arteries sufficient height above annulus for deployment   5.  Significant mitral annular calcification   6.  Membranous septal length 7 mm   7. Aortic aneurysm 4.6 cm would argue against TAVR if patient is a SAVR candidate   Jenkins Rouge     Electronically Signed   By: Jenkins Rouge M.D.   On: 01/09/2022 12:13    Addended by Josue Hector, MD on 01/09/2022 12:16 PM   Study Result  Narrative & Impression  EXAM: OVER-READ INTERPRETATION  CT CHEST   The following report is a limited chest CT over-read performed by radiologist Dr. Vinnie Langton of [facility], [place] on 01/09/2022. The coronary calcium score and cardiac CTA interpretation by the cardiologist is attached.   COMPARISON:  Chest CTA 07/23/2021.   FINDINGS: Extracardiac findings will be described separately under dictation for contemporaneously obtained CTA chest, abdomen and pelvis.   IMPRESSION: Please see separate dictation for contemporaneously obtained CTA chest, abdomen and pelvis dated 01/09/2022 for full description of relevant  extracardiac findings.   Electronically Signed: By: Vinnie Langton M.D. On: 01/09/2022 10:57     Impression:  This 81 year old gentleman has stage C, asymptomatic moderate to severe aortic stenosis with a mean gradient of 23 mmHg.  There is moderate aortic insufficiency.   Left ventricular ejection fraction is normal. His LV diastolic dimension is 5.1 cm which is up slightly from 4.8 cm in December 2022.  His cardiac CTA also shows a 4.6 cm fusiform ascending aortic aneurysm.  He remains very active with no symptoms whatsoever and I think it is reasonable to continue close follow-up with a repeat echocardiogram in 6 months.  I suspect he will develop some symptoms in the near future and I reviewed these with him and his wife and told them to notify us immediately if any of them develop.  His gated cardiac CTA shows anatomy suitable for TAVR using a 29 mm SAPIEN 3 valve although there is some calcification extending into the subannular area.  Given his advanced age I think it is still reasonable to perform TAVR when he develops any symptoms or signs of left ventricular dysfunction instead of doing open surgical AVR and replacement of his mildly enlarged ascending aorta.  I reviewed his echo and CTA studies with him and his wife and answered their questions.  He is in agreement with continued close follow-up.  Plan:  I will see him back in 6 months with a repeat echocardiogram.  He will let us know if he develops any symptoms attributable to aortic stenosis or aortic insufficiency.  I spent 20 minutes performing this established patient evaluation and > 50% of this time was spent face to face counseling and coordinating the care of this patient's aortic stenosis/insufficiency and aortic aneurysm.    Gaye Pollack, MD [facility] [phone]

## 2022-02-25 ENCOUNTER — Other Ambulatory Visit: Payer: Self-pay

## 2022-02-25 DIAGNOSIS — I350 Nonrheumatic aortic (valve) stenosis: Secondary | ICD-10-CM | POA: Diagnosis not present

## 2022-02-25 DIAGNOSIS — R972 Elevated prostate specific antigen [PSA]: Secondary | ICD-10-CM | POA: Diagnosis not present

## 2022-02-25 DIAGNOSIS — N529 Male erectile dysfunction, unspecified: Secondary | ICD-10-CM | POA: Diagnosis not present

## 2022-02-25 DIAGNOSIS — Z0000 Encounter for general adult medical examination without abnormal findings: Secondary | ICD-10-CM | POA: Diagnosis not present

## 2022-02-25 DIAGNOSIS — E559 Vitamin D deficiency, unspecified: Secondary | ICD-10-CM | POA: Diagnosis not present

## 2022-02-25 DIAGNOSIS — Z1331 Encounter for screening for depression: Secondary | ICD-10-CM | POA: Diagnosis not present

## 2022-02-25 DIAGNOSIS — D509 Iron deficiency anemia, unspecified: Secondary | ICD-10-CM | POA: Diagnosis not present

## 2022-02-25 DIAGNOSIS — M109 Gout, unspecified: Secondary | ICD-10-CM | POA: Diagnosis not present

## 2022-02-25 DIAGNOSIS — I517 Cardiomegaly: Secondary | ICD-10-CM | POA: Diagnosis not present

## 2022-02-25 DIAGNOSIS — E782 Mixed hyperlipidemia: Secondary | ICD-10-CM | POA: Diagnosis not present

## 2022-02-25 DIAGNOSIS — I10 Essential (primary) hypertension: Secondary | ICD-10-CM | POA: Diagnosis not present

## 2022-02-25 DIAGNOSIS — Z79899 Other long term (current) drug therapy: Secondary | ICD-10-CM | POA: Diagnosis not present

## 2022-02-25 DIAGNOSIS — Z1211 Encounter for screening for malignant neoplasm of colon: Secondary | ICD-10-CM | POA: Diagnosis not present

## 2022-02-25 MED ORDER — PEG 3350-KCL-NA BICARB-NACL 420 G PO SOLR
ORAL | 0 refills | Status: DC
  Filled 2022-02-25: qty 4000, 1d supply, fill #0

## 2022-03-02 ENCOUNTER — Ambulatory Visit
Admission: RE | Admit: 2022-03-02 | Discharge: 2022-03-02 | Disposition: A | Discharge status: Home / Self Care | Payer: PPO | Source: Ambulatory Visit | Attending: Gastroenterology | Admitting: Gastroenterology

## 2022-03-02 DIAGNOSIS — R195 Other fecal abnormalities: Secondary | ICD-10-CM

## 2022-03-31 ENCOUNTER — Other Ambulatory Visit (HOSPITAL_COMMUNITY): Payer: Self-pay

## 2022-03-31 DIAGNOSIS — R972 Elevated prostate specific antigen [PSA]: Secondary | ICD-10-CM | POA: Diagnosis not present

## 2022-04-02 DIAGNOSIS — E782 Mixed hyperlipidemia: Secondary | ICD-10-CM | POA: Diagnosis not present

## 2022-04-02 DIAGNOSIS — I10 Essential (primary) hypertension: Secondary | ICD-10-CM | POA: Diagnosis not present

## 2022-04-02 DIAGNOSIS — R972 Elevated prostate specific antigen [PSA]: Secondary | ICD-10-CM | POA: Diagnosis not present

## 2022-04-02 DIAGNOSIS — E559 Vitamin D deficiency, unspecified: Secondary | ICD-10-CM | POA: Diagnosis not present

## 2022-04-02 DIAGNOSIS — Z79899 Other long term (current) drug therapy: Secondary | ICD-10-CM | POA: Diagnosis not present

## 2022-04-08 DIAGNOSIS — R972 Elevated prostate specific antigen [PSA]: Secondary | ICD-10-CM | POA: Diagnosis not present

## 2022-04-08 DIAGNOSIS — R351 Nocturia: Secondary | ICD-10-CM | POA: Diagnosis not present

## 2022-04-08 DIAGNOSIS — N401 Enlarged prostate with lower urinary tract symptoms: Secondary | ICD-10-CM | POA: Diagnosis not present

## 2022-04-13 DIAGNOSIS — Z961 Presence of intraocular lens: Secondary | ICD-10-CM | POA: Diagnosis not present

## 2022-04-13 DIAGNOSIS — H35362 Drusen (degenerative) of macula, left eye: Secondary | ICD-10-CM | POA: Diagnosis not present

## 2022-04-13 DIAGNOSIS — H02055 Trichiasis without entropian left lower eyelid: Secondary | ICD-10-CM | POA: Diagnosis not present

## 2022-04-13 DIAGNOSIS — H04123 Dry eye syndrome of bilateral lacrimal glands: Secondary | ICD-10-CM | POA: Diagnosis not present

## 2022-06-09 ENCOUNTER — Other Ambulatory Visit: Payer: Self-pay

## 2022-06-09 DIAGNOSIS — I350 Nonrheumatic aortic (valve) stenosis: Secondary | ICD-10-CM

## 2022-06-09 DIAGNOSIS — I77819 Aortic ectasia, unspecified site: Secondary | ICD-10-CM

## 2022-08-11 ENCOUNTER — Other Ambulatory Visit (HOSPITAL_COMMUNITY): Payer: PPO

## 2022-08-19 ENCOUNTER — Ambulatory Visit: Payer: PPO | Admitting: Surgery

## 2022-09-09 ENCOUNTER — Ambulatory Visit: Payer: PPO | Admitting: Interventional Cardiology

## 2022-09-16 ENCOUNTER — Ambulatory Visit: Payer: PPO | Admitting: Surgery

## 2022-09-16 ENCOUNTER — Other Ambulatory Visit (HOSPITAL_COMMUNITY): Payer: PPO

## 2022-09-23 ENCOUNTER — Ambulatory Visit: Payer: PPO | Admitting: Surgery

## 2022-10-06 ENCOUNTER — Other Ambulatory Visit (HOSPITAL_COMMUNITY): Payer: PPO

## 2022-10-07 ENCOUNTER — Other Ambulatory Visit (HOSPITAL_COMMUNITY): Payer: PPO

## 2022-10-09 ENCOUNTER — Telehealth: Payer: Self-pay

## 2022-10-09 NOTE — Telephone Encounter (Signed)
The pt is scheduled for an upcoming appointment on 3/20 with Dr Cyndia Bent, but he cancelled 3/13 echocardiogram that needed to be performed prior to his visit. I contacted the pt's home and the pt's daughter answered the phone.  She asked that I cancel his 3/20 appointment because the pt's wife is actively dying.  I made her aware that I will plan to contact the patient in a couple of weeks to discuss rescheduling his appointments.

## 2022-10-14 ENCOUNTER — Ambulatory Visit: Payer: PPO | Admitting: Surgery

## 2022-10-26 ENCOUNTER — Telehealth: Payer: Self-pay

## 2022-10-26 NOTE — Telephone Encounter (Signed)
The patient reports he cancelled his 10/29/22 echo and visit with Dr. Irish Lack because his wife died recently. Offered to reschedule, but he declined at this time. Offered to call back in a few weeks, and he declined that offer as well. He stated he will call back if he decides to proceed with further TAVR evaluation. Confirmed he has Lauren's direct number.  Condolences given. He was grateful for assistance.

## 2022-10-29 ENCOUNTER — Ambulatory Visit: Payer: PPO | Admitting: Interventional Cardiology

## 2022-12-28 ENCOUNTER — Telehealth: Payer: Self-pay

## 2022-12-28 NOTE — Telephone Encounter (Signed)
Left message for the patient to contact the office in regards to rescheduling echocardiogram and office visits with Drs Eldridge Dace and [place].

## 2023-05-18 DIAGNOSIS — I10 Essential (primary) hypertension: Secondary | ICD-10-CM | POA: Diagnosis not present

## 2023-05-18 DIAGNOSIS — N400 Enlarged prostate without lower urinary tract symptoms: Secondary | ICD-10-CM | POA: Diagnosis not present

## 2023-05-18 DIAGNOSIS — M109 Gout, unspecified: Secondary | ICD-10-CM | POA: Diagnosis not present

## 2023-05-18 DIAGNOSIS — I7121 Aneurysm of the ascending aorta, without rupture: Secondary | ICD-10-CM | POA: Diagnosis not present

## 2023-06-21 IMAGING — CT CT ANGIO CHEST
2 of 6 series · 17 of 46 positions shown · IV contrast (APPLIED)
Comparison: None.

CLINICAL DATA: Aortic aneurysm suspected. Reported progressive
aortic stenosis and aortic dilation since 4019

EXAM:
CT ANGIOGRAPHY CHEST WITH CONTRAST
TECHNIQUE: Multidetector CT imaging of the chest was performed using the
standard protocol during bolus administration of intravenous
contrast. Multiplanar CT image reconstructions and MIPs were
obtained to evaluate the vascular anatomy.
CONTRAST:  60mL OMNIPAQUE IOHEXOL 350 MG/ML SOLN

[Series 4: arterial · axial · arterial · 0.78mm/px · z∈[+1191,+1451]mm · 14 of 150 slices shown]
[im 10/150  lung]
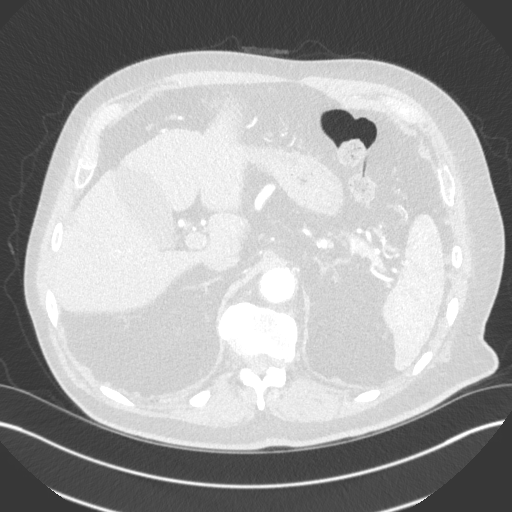
[im 20/150  soft-tissue]
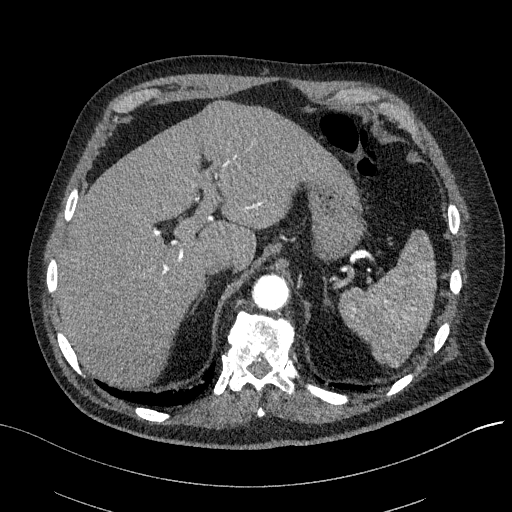
[im 30/150  lung]
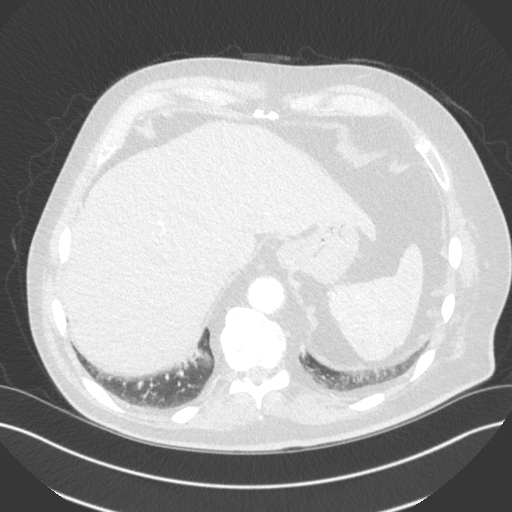
[im 40/150  soft-tissue]
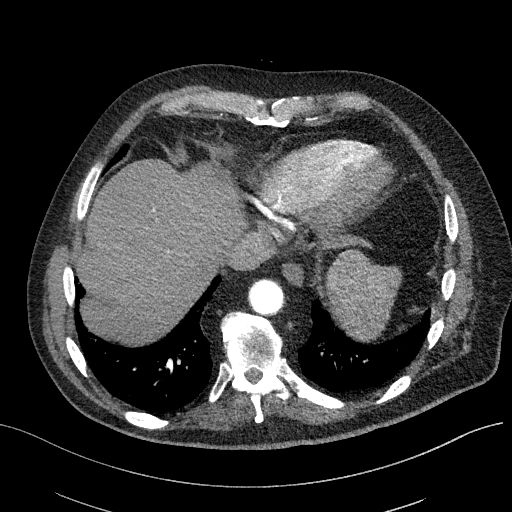
[im 50/150  lung]
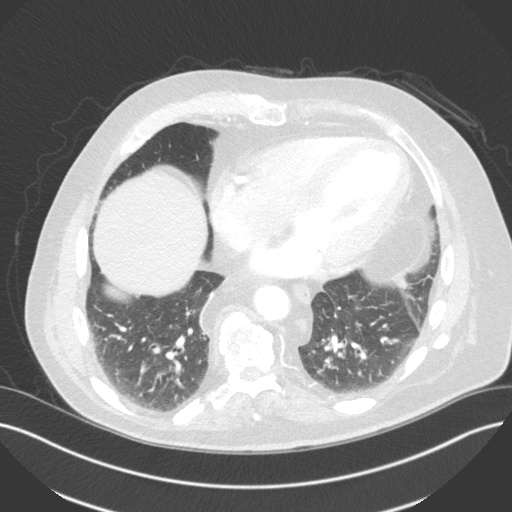
[im 60/150  soft-tissue]
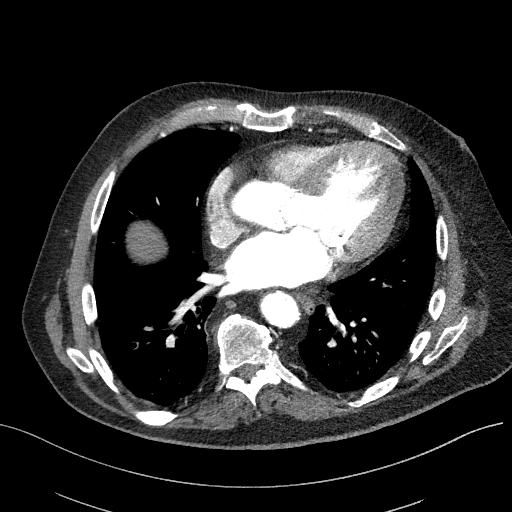
[im 70/150  lung]
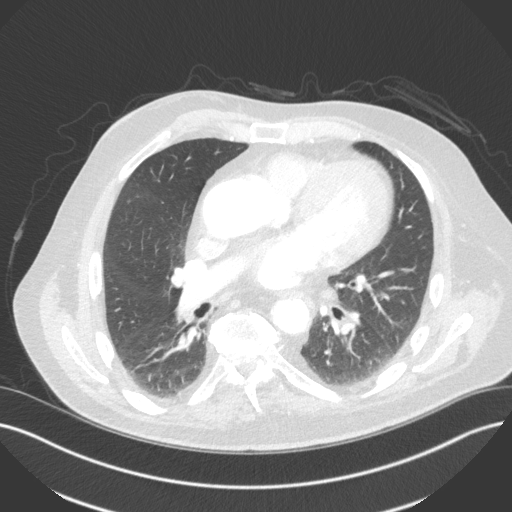
[im 80/150  soft-tissue]
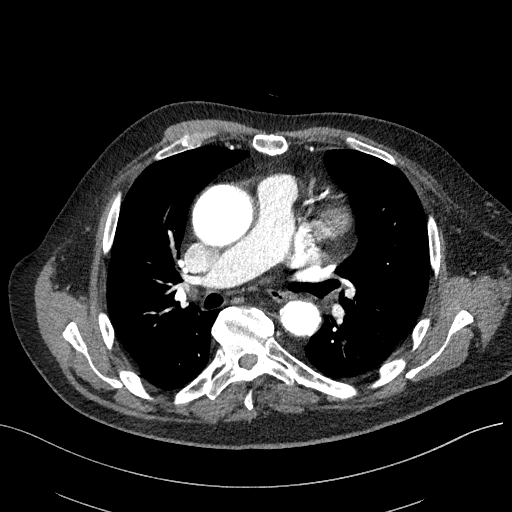
[im 90/150  lung]
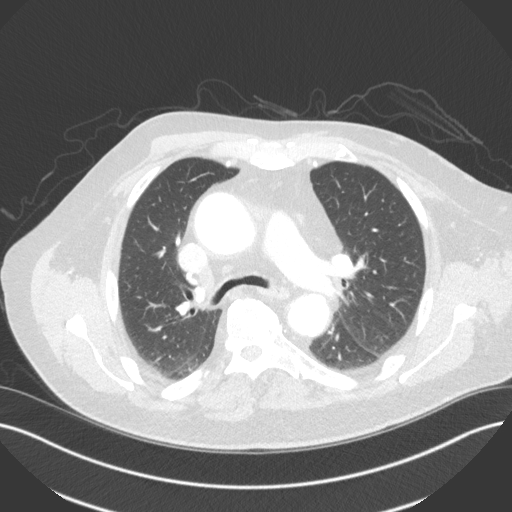
[im 100/150  soft-tissue]
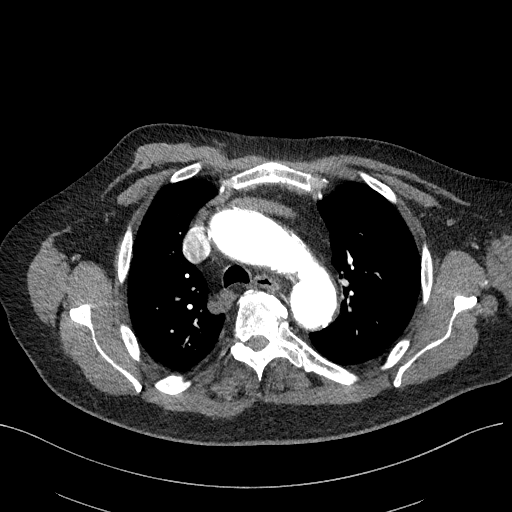
[im 110/150  lung]
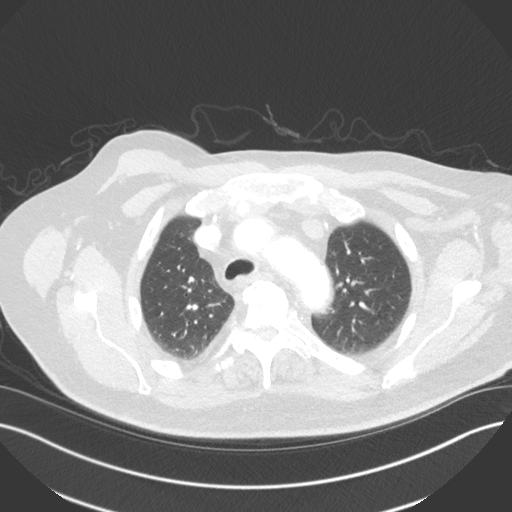
[im 120/150  soft-tissue]
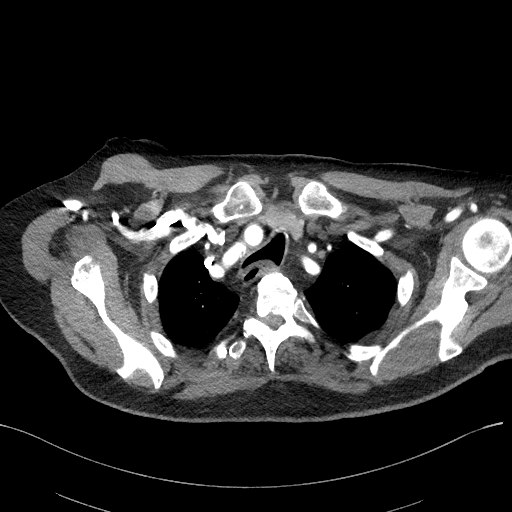
[im 130/150  lung]
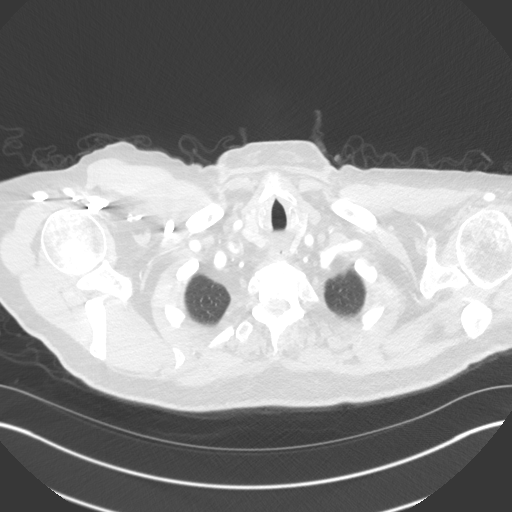
[im 140/150  soft-tissue]
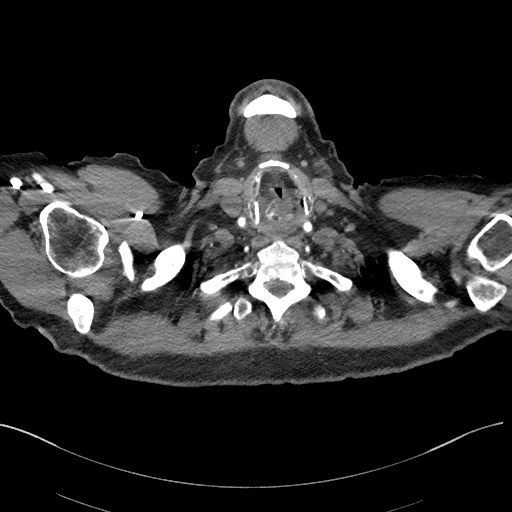

[Series 7: cor soft · coronal · 0.59mm/px · 3 of 147 slices shown]
[im 37/147  soft-tissue]
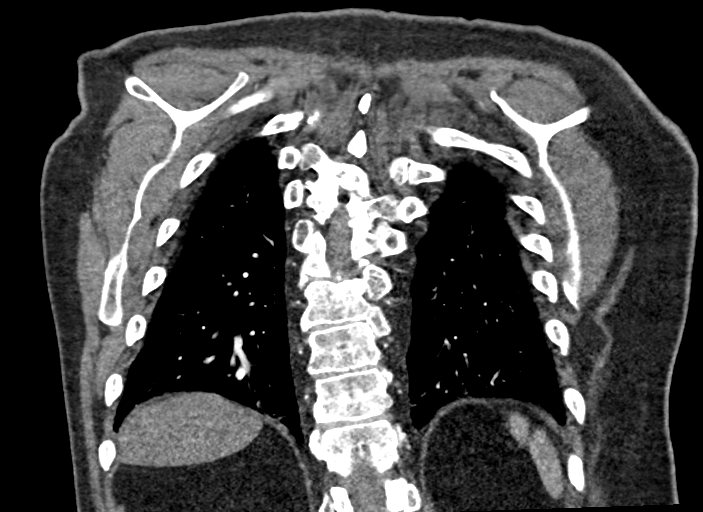
[im 74/147  soft-tissue]
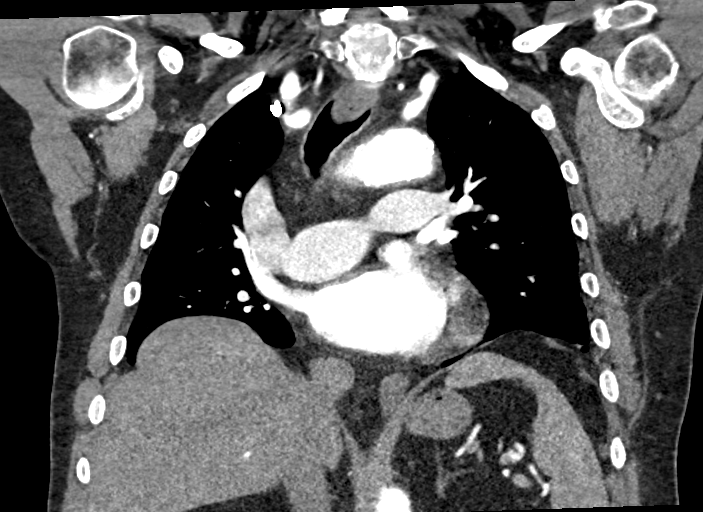
[im 110/147  soft-tissue]
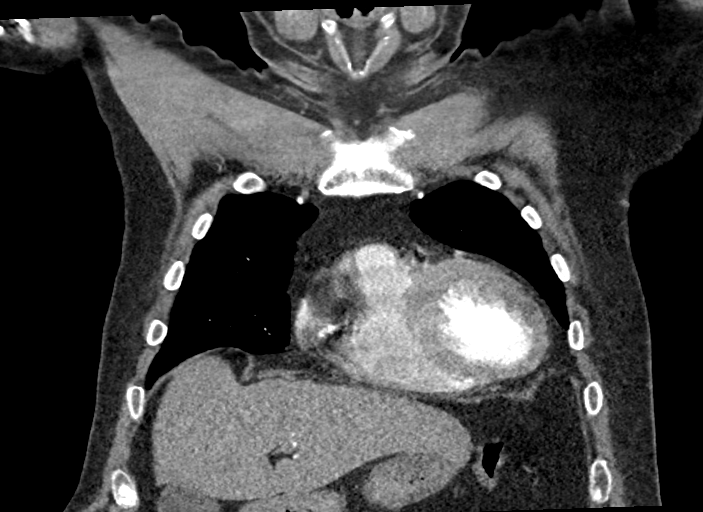

[17 of 46 positions shown; findings below may reference images not displayed]

FINDINGS: CARDIOVASCULAR:

Limitations by motion: Moderate

Preferential opacification of the thoracic aorta. No evidence of
thoracic aortic dissection.

Aortic Root: Difficult to assess, measuring proximally

--Valve: 3.0 cm

--Sinuses: 3.6 cm

--Sinotubular Junction: 3.2 cm

Thoracic Aorta:

--Ascending Aorta: 5.0 cm

--Aortic Arch: 3.5 cm

--Descending Aorta: 3.2 cm

LEFT aortic arch and "bovine" normal-variant, with common origins of
the innominate and LEFT common carotid arteries.

Moderate calcified and noncalcified atheromatous disease at the
origin of the LEFT subclavian artery, without significant stenosis.

Other:

Normal heart size. No pericardial effusion. A severe burden of
aortic, mitral valve and multivessel coronary artery calcifications
are present.

Mediastinum/Nodes: No enlarged mediastinal, hilar, or axillary lymph
nodes. Thyroid gland, trachea, and esophagus demonstrate no
significant findings.

Lungs/Pleura: Trace bibasilar atelectasis. Lungs are otherwise
clear. No pleural effusion or pneumothorax.

Upper Abdomen: No acute abnormality.

Musculoskeletal: No chest wall abnormality. Punctate sclerotic focus
at the RIGHT lateral 8th rib. No acute osseous findings.

Review of the MIP images confirms the above findings.
IMPRESSION: 1. 5.0 cm ascending thoracic aortic aneurysm. Ascending thoracic
aortic aneurysm.
Continue semi-annual imaging followup by CTA or MRA and referral to
cardiothoracic surgery if not already obtained. This recommendation
follows 0767 ACCF/AHA/AATS/ACR/ASA/SCA/ZAMAU/MA DEL ROSARIO/REISUKE/MARY CATHERINE Guidelines
for the Diagnosis and Management of Patients With Thoracic Aortic
Disease. Circulation. 0767; 121: E266-e369. Aortic aneurysm NOS
([id number].6)
2. Severe burden of aortic, mitral valve and multivessel coronary
artery calcifications.
3. Incidental, punctate sclerotic focus of the RIGHT lateral eighth
rib, most commonly a fibro-osseous lesion.

## 2023-08-24 ENCOUNTER — Other Ambulatory Visit: Payer: Self-pay

## 2023-08-24 DIAGNOSIS — I350 Nonrheumatic aortic (valve) stenosis: Secondary | ICD-10-CM

## 2023-08-24 DIAGNOSIS — I77819 Aortic ectasia, unspecified site: Secondary | ICD-10-CM

## 2023-10-06 ENCOUNTER — Ambulatory Visit (HOSPITAL_COMMUNITY)
Admission: RE | Admit: 2023-10-06 | Discharge: 2023-10-06 | Disposition: A | Discharge status: Home / Self Care | Payer: PPO | Source: Ambulatory Visit | Attending: Surgery | Admitting: Surgery

## 2023-10-06 DIAGNOSIS — I080 Rheumatic disorders of both mitral and aortic valves: Secondary | ICD-10-CM | POA: Diagnosis not present

## 2023-10-06 DIAGNOSIS — I77819 Aortic ectasia, unspecified site: Secondary | ICD-10-CM | POA: Insufficient documentation

## 2023-10-06 DIAGNOSIS — I350 Nonrheumatic aortic (valve) stenosis: Secondary | ICD-10-CM

## 2023-10-06 DIAGNOSIS — N189 Chronic kidney disease, unspecified: Secondary | ICD-10-CM | POA: Diagnosis not present

## 2023-10-06 LAB — ECHOCARDIOGRAM COMPLETE
AR max vel: 0.91 cm2
AV Area VTI: 0.88 cm2
AV Area mean vel: 0.87 cm2
AV Mean grad: 37.5 mmHg
AV Peak grad: 61.8 mmHg
Ao pk vel: 3.93 m/s
MV M vel: 6.72 m/s
MV Peak grad: 180.6 mmHg
P 1/2 time: 405 ms
Radius: 0.4 cm
S' Lateral: 3.9 cm
Single Plane A2C EF: 50 %

## 2023-10-06 NOTE — Progress Notes (Signed)
  Echocardiogram 2D Echocardiogram has been performed.  Delcie Roch 10/06/2023, 2:43 PM

## 2023-10-13 ENCOUNTER — Ambulatory Visit: Payer: PPO | Admitting: Surgery

## 2023-10-13 ENCOUNTER — Encounter: Payer: Self-pay | Admitting: Surgery

## 2023-10-13 VITALS — BP 168/68 | HR 83 | Resp 18 | Ht 70.0 in | Wt 140.0 lb

## 2023-10-13 DIAGNOSIS — I350 Nonrheumatic aortic (valve) stenosis: Secondary | ICD-10-CM

## 2023-10-13 NOTE — Progress Notes (Unsigned)
     HPI: ***  Current Outpatient Medications  Medication Sig Dispense Refill   allopurinol (ZYLOPRIM) 300 MG tablet Take 300 mg by mouth 2 (two) times daily.      Ascorbic Acid (VITAMIN C) 1000 MG tablet Take 500 mg by mouth daily.      calcium citrate (CALCITRATE - DOSED IN MG ELEMENTAL CALCIUM) 950 MG tablet Take 1 tablet by mouth daily. 600mg  3 times a week     Cholecalciferol (VITAMIN D3) 10000 UNITS capsule Take 10,000 Units by mouth daily. 10000units 3x per week, 5000units 4x per week.     finasteride (PROSCAR) 5 MG tablet Take 5 mg by mouth daily.     magnesium oxide (MAG-OX) 400 MG tablet Take 400 mg by mouth daily.     Multiple Vitamin (MULTIVITAMIN WITH MINERALS) TABS Take 1 tablet by mouth daily.     OVER THE COUNTER MEDICATION 1 tablet daily. Trunature Prostate Health     psyllium (METAMUCIL) 58.6 % packet Take 1 packet by mouth daily.     PURE L-ARGININE HCL PO Take 1,800 mg by mouth.     vitamin B-12 (CYANOCOBALAMIN) 500 MCG tablet Take 500 mcg by mouth daily.     zinc gluconate 50 MG tablet Take 30 mg by mouth daily.      No current facility-administered medications for this visit.     Physical Exam: ***  Diagnostic Tests: ***  Impression: ***  Plan: ***   Alleen Borne, MD [facility] [phone]

## 2023-12-08 IMAGING — CT CT ANGIO CHEST
3 of 14 series · 15 of 36 positions shown · IV contrast (APPLIED)
Comparison: Chest CTA 07/23/2021.

CLINICAL DATA: 81-year-old male with history of severe aortic
stenosis. Preprocedural study prior to potential transcatheter
aortic valve replacement (TAVR) procedure.

EXAM:
CT ANGIOGRAPHY CHEST, ABDOMEN AND PELVIS
TECHNIQUE: Multidetector CT imaging through the chest, abdomen and pelvis was
performed using the standard protocol during bolus administration of
intravenous contrast. Multiplanar reconstructed images and MIPs were
obtained and reviewed to evaluate the vascular anatomy.

[Series 8: 0-90% · axial · 0.43mm/px · z∈[-113,+12]mm · 6 of 3340 slices shown]
[im 418/3340  lung]
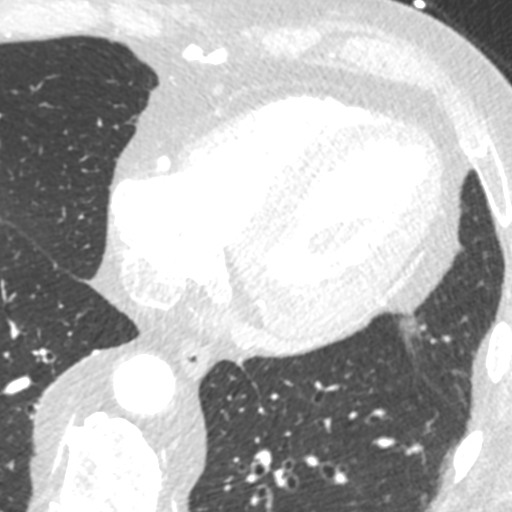
[im 835/3340  lung]
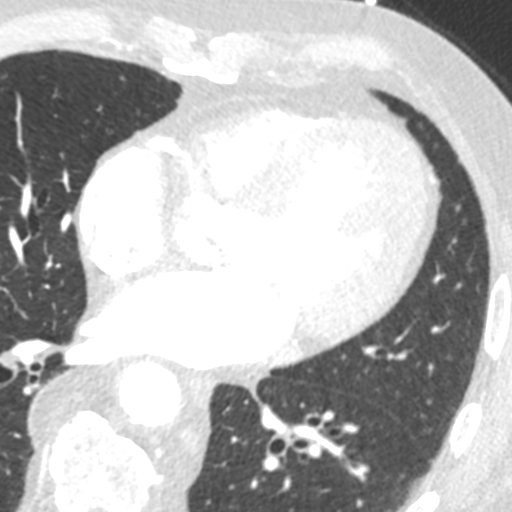
[im 1253/3340  lung]
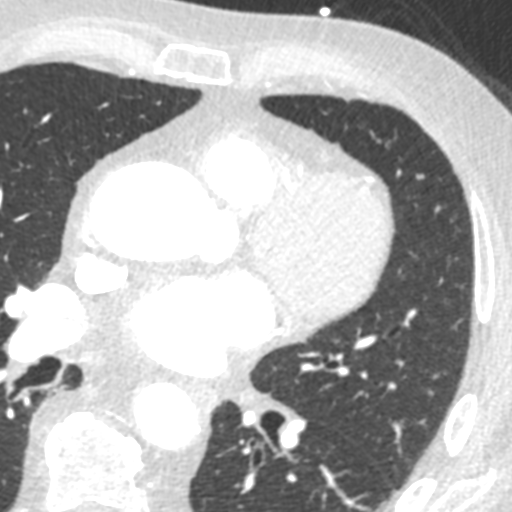
[im 1670/3340  lung]
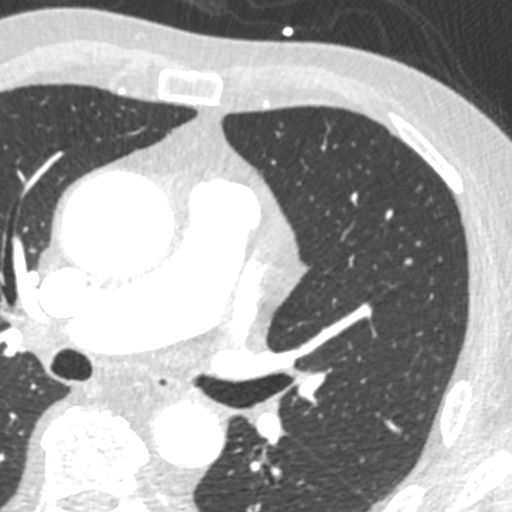
[im 2087/3340  lung]
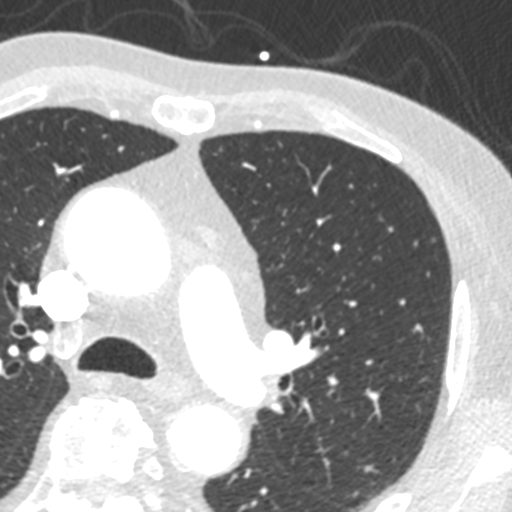
[im 2505/3340  lung]
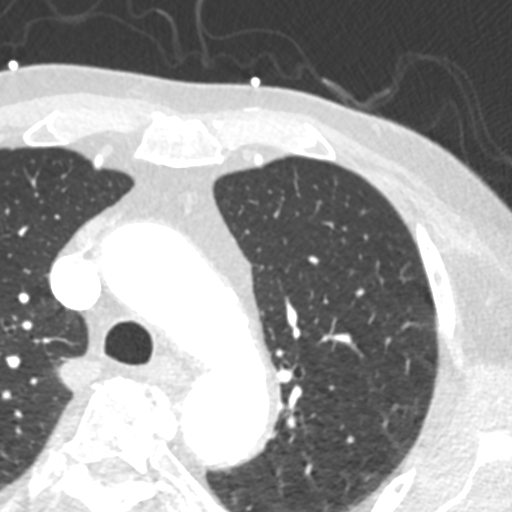

[Series 9: 5-95% · axial · 0.43mm/px · z∈[-116,+40]mm · 8 of 3340 slices shown]
[im 372/3340  lung]
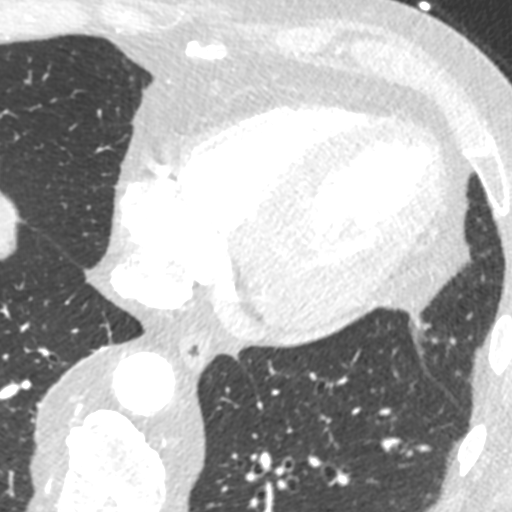
[im 743/3340  mediastinal]
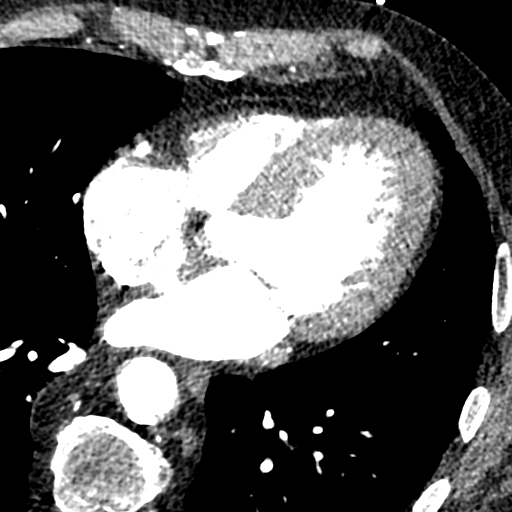
[im 1114/3340  lung]
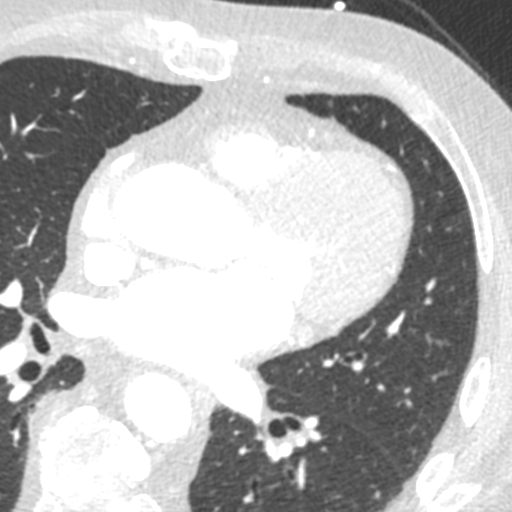
[im 1485/3340  mediastinal]
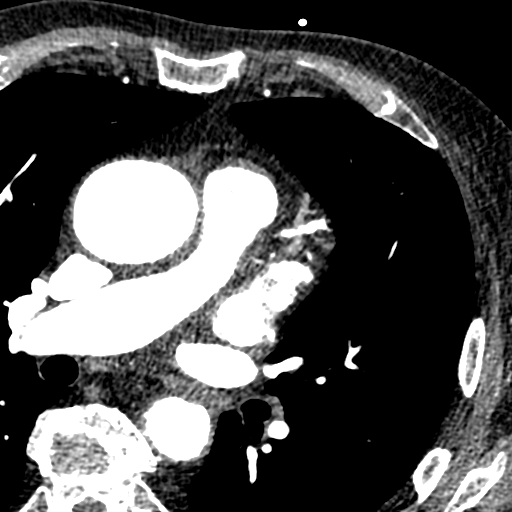
[im 1856/3340  lung]
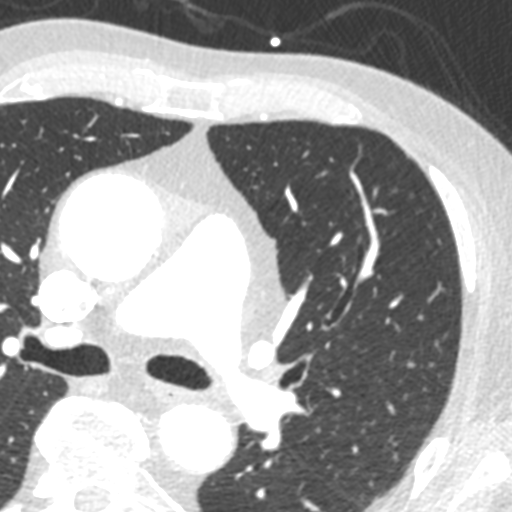
[im 2227/3340  mediastinal]
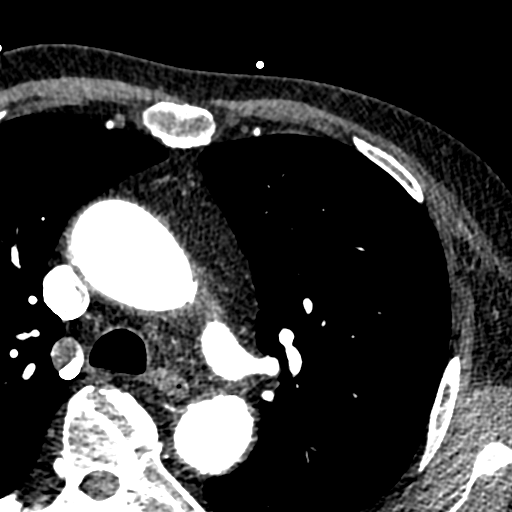
[im 2598/3340  lung]
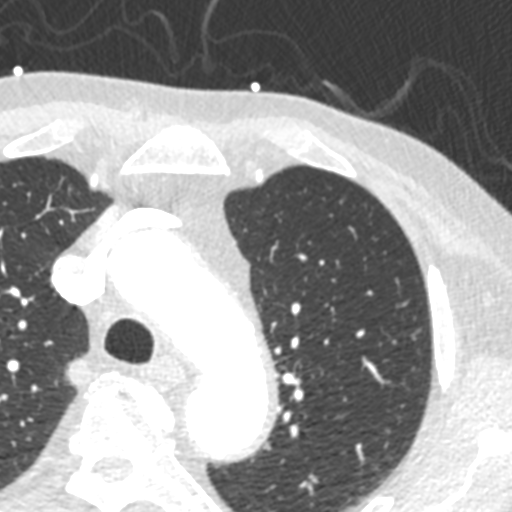
[im 2969/3340  mediastinal]
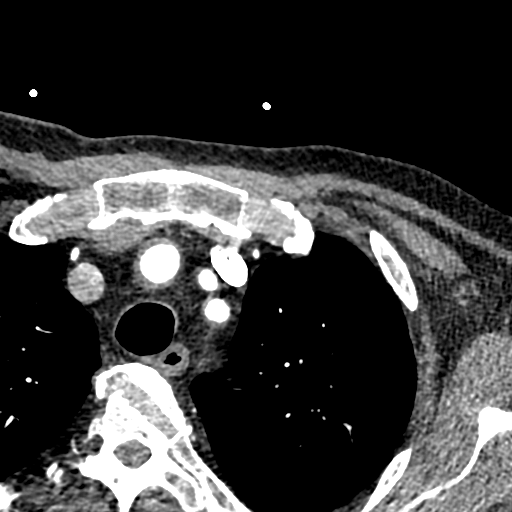

[Series 15: cor · coronal · 0.97mm/px · 1 of 149 slices shown]
[im 75/149  mediastinal]
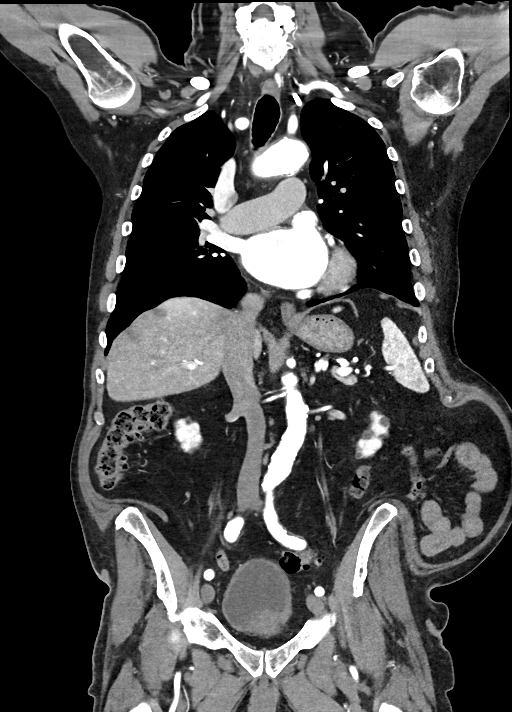

[15 of 36 positions shown; findings below may reference images not displayed]

RADIATION DOSE REDUCTION: This exam was performed according to the
departmental dose-optimization program which includes automated
exposure control, adjustment of the mA and/or kV according to
patient size and/or use of iterative reconstruction technique.

CONTRAST:  100mL OMNIPAQUE IOHEXOL 350 MG/ML SOLN
FINDINGS: CTA CHEST FINDINGS

Cardiovascular: Heart size is borderline enlarged. There is no
significant pericardial fluid, thickening or pericardial
calcification. There is aortic atherosclerosis, as well as
atherosclerosis of the great vessels of the mediastinum and the
coronary arteries, including calcified atherosclerotic plaque in the
left main, left anterior descending, left circumflex and right
coronary arteries. Severe calcifications and thickening of the
aortic valve. Severe calcifications of the mitral annulus. Mild
aneurysmal dilatation of the ascending thoracic aorta (4.6 cm in
diameter).

Mediastinum/Lymph Nodes: No pathologically enlarged mediastinal or
hilar lymph nodes. Esophagus is unremarkable in appearance. No
axillary lymphadenopathy.

Lungs/Pleura: Mild scarring in the lung bases bilaterally. No acute
consolidative airspace disease. No pleural effusions. No suspicious
appearing pulmonary nodules or masses are noted.

Musculoskeletal/Soft Tissues: There are no aggressive appearing
lytic or blastic lesions noted in the visualized portions of the
skeleton.

CTA ABDOMEN AND PELVIS FINDINGS

Hepatobiliary: No suspicious cystic or solid hepatic lesions. No
intra or extrahepatic biliary ductal dilatation. Gallbladder is
unremarkable in appearance.

Pancreas: No suspicious appearing pancreatic mass. No pancreatic
ductal dilatation. No pancreatic or peripancreatic mass no
peripancreatic fluid collections or inflammatory changes.

Spleen: Unremarkable.

Adrenals/Urinary Tract: Mild bilateral renal atrophy. Multiple
low-attenuation lesions in both kidneys, largest of which are simple
cysts, measuring up to 2.2 cm in the anterior aspect of the
interpolar region of the left kidney. Other subcentimeter
low-attenuation lesions in both kidneys, too small to definitively
characterize, but statistically likely to represent tiny cysts (no
imaging follow-up is recommended). 3 mm nonobstructive calculus in
the upper pole of the right kidney. No hydroureteronephrosis.
Calcifications in the urinary bladder measuring 4 mm dependently,
and 5 mm adjacent to the left ureterovesicular junction (axial image
195 of series 13). Urinary bladder is otherwise unremarkable in
appearance.

Stomach/Bowel: The appearance of the stomach is normal. No
pathologic dilatation of small bowel or colon. Suture line near the
rectosigmoid junction from prior partial colectomy. Numerous colonic
diverticulae are noted, without surrounding inflammatory changes to
suggest an acute diverticulitis at this time. Normal appendix.

Vascular/Lymphatic: Aortic atherosclerosis, with vascular findings
and measurements pertinent to potential TAVR procedure, as detailed
below. No aneurysm or dissection noted in the abdominal or pelvic
vasculature. No lymphadenopathy noted in the abdomen or pelvis.

Reproductive: Prostate gland is severely enlarged and heterogeneous
in appearance measuring 7.0 x 5.5 x 8.4 cm. Seminal vesicles are
unremarkable in appearance.

Other: No significant volume of ascites.  No pneumoperitoneum.

Musculoskeletal: There are no aggressive appearing lytic or blastic
lesions noted in the visualized portions of the skeleton.

VASCULAR MEASUREMENTS PERTINENT TO TAVR:

AORTA:

Minimal Aortic Ciameter-W9 x 16 mm

Severity of Aortic Calcification-moderate

RIGHT PELVIS:

Right Common Iliac Artery -

Minimal Iiameter-UU.F x 9.6 mm

Tortuosity-mild

Calcification-mild

Right External Iliac Artery -

Minimal Xiameter-9.4 x 7.8 mm

Tortuosity-severe

Calcification-mild

Right Common Femoral Artery -

Minimal Xiameter-V.O x 6.6 mm

Tortuosity-mild

Calcification-mild

LEFT PELVIS:

Left Common Iliac Artery -

Minimal 3iameter-1S.H x 9.2 mm

Tortuosity-mild

Calcification-mild

Left External Iliac Artery -

Minimal Xiameter-9.4 x 6.8 mm

Tortuosity-moderate

Calcification-mild

Left Common Femoral Artery -

Minimal Giameter-Q.6 x 8.8 mm

Tortuosity-mild

Calcification-mild

Review of the MIP images confirms the above findings.
IMPRESSION: 1. Vascular findings and measurements pertinent to potential TAVR
procedure, as detailed above.
2. Severe thickening and calcification of the aortic valve,
compatible with reported clinical history of severe aortic stenosis.
3. Mild aneurysmal dilatation of the ascending thoracic aorta (4.6
cm in diameter). Ascending thoracic aortic aneurysm. Recommend
semi-annual imaging followup by CTA or MRA and referral to
cardiothoracic surgery if not already obtained. This recommendation
follows 0646 ACCF/AHA/AATS/ACR/ASA/SCA/JEFERSON/KINO/RABELO/JAVON Guidelines
for the Diagnosis and Management of Patients With Thoracic Aortic
Disease. Circulation. 0646; 121: E266-e369. Aortic aneurysm NOS
([id number].[id number]).
4. Severe calcifications of the mitral annulus.
5. Aortic atherosclerosis, in addition to left main and three-vessel
coronary artery disease.
6. Severe prostatomegaly.
7. Colonic diverticulosis without evidence of acute diverticulitis
at this time.
8. Additional incidental findings, as above.

## 2024-02-03 ENCOUNTER — Encounter: Payer: Self-pay | Admitting: Physician Assistant

## 2024-02-03 ENCOUNTER — Other Ambulatory Visit: Payer: Self-pay | Admitting: Physician Assistant

## 2024-02-03 DIAGNOSIS — I350 Nonrheumatic aortic (valve) stenosis: Secondary | ICD-10-CM

## 2024-02-03 NOTE — Addendum Note (Signed)
 Addended by: SEBASTIAN LAMARR SAUNDERS on: 02/03/2024 12:20 PM   Modules accepted: Orders

## 2024-02-23 ENCOUNTER — Other Ambulatory Visit: Payer: Self-pay

## 2024-02-23 ENCOUNTER — Emergency Department (HOSPITAL_COMMUNITY)
Admission: EM | Admit: 2024-02-23 | Discharge: 2024-02-23 | Disposition: A | Discharge status: Home / Self Care | Source: Ambulatory Visit | Attending: Emergency Medicine | Admitting: Emergency Medicine

## 2024-02-23 DIAGNOSIS — Z79899 Other long term (current) drug therapy: Secondary | ICD-10-CM | POA: Insufficient documentation

## 2024-02-23 DIAGNOSIS — I10 Essential (primary) hypertension: Secondary | ICD-10-CM | POA: Insufficient documentation

## 2024-02-23 DIAGNOSIS — H81399 Other peripheral vertigo, unspecified ear: Secondary | ICD-10-CM | POA: Diagnosis not present

## 2024-02-23 DIAGNOSIS — R42 Dizziness and giddiness: Secondary | ICD-10-CM | POA: Diagnosis present

## 2024-02-23 DIAGNOSIS — R55 Syncope and collapse: Secondary | ICD-10-CM | POA: Diagnosis not present

## 2024-02-23 LAB — CBC WITH DIFFERENTIAL/PLATELET
Abs Immature Granulocytes: 0.04 K/uL (ref 0.00–0.07)
Basophils Absolute: 0 K/uL (ref 0.0–0.1)
Basophils Relative: 0 %
Eosinophils Absolute: 0.1 K/uL (ref 0.0–0.5)
Eosinophils Relative: 1 %
HCT: 40.6 % (ref 39.0–52.0)
Hemoglobin: 13.2 g/dL (ref 13.0–17.0)
Immature Granulocytes: 1 %
Lymphocytes Relative: 17 %
Lymphs Abs: 1.1 K/uL (ref 0.7–4.0)
MCH: 31.1 pg (ref 26.0–34.0)
MCHC: 32.5 g/dL (ref 30.0–36.0)
MCV: 95.8 fL (ref 80.0–100.0)
Monocytes Absolute: 0.8 K/uL (ref 0.1–1.0)
Monocytes Relative: 11 %
Neutro Abs: 4.7 K/uL (ref 1.7–7.7)
Neutrophils Relative %: 70 %
Platelets: 143 K/uL — ABNORMAL LOW (ref 150–400)
RBC: 4.24 MIL/uL (ref 4.22–5.81)
RDW: 15 % (ref 11.5–15.5)
WBC: 6.7 K/uL (ref 4.0–10.5)
nRBC: 0 % (ref 0.0–0.2)

## 2024-02-23 LAB — BASIC METABOLIC PANEL WITH GFR
Anion gap: 10 (ref 5–15)
BUN: 33 mg/dL — ABNORMAL HIGH (ref 8–23)
CO2: 25 mmol/L (ref 22–32)
Calcium: 9.6 mg/dL (ref 8.9–10.3)
Chloride: 106 mmol/L (ref 98–111)
Creatinine, Ser: 1.41 mg/dL — ABNORMAL HIGH (ref 0.61–1.24)
GFR, Estimated: 50 mL/min — ABNORMAL LOW (ref >60)
Glucose, Bld: 102 mg/dL — ABNORMAL HIGH (ref 70–99)
Potassium: 4 mmol/L (ref 3.5–5.1)
Sodium: 141 mmol/L (ref 135–145)

## 2024-02-23 MED ORDER — MECLIZINE HCL 25 MG PO TABS: 25.0000 mg | ORAL_TABLET | Freq: Three times a day (TID) | ORAL | 0 refills | Status: AC | PRN

## 2024-02-23 MED ORDER — MECLIZINE HCL 25 MG PO TABS
25.0000 mg | ORAL_TABLET | Freq: Once | ORAL | Status: AC
  Administered 2024-02-23: 25 mg via ORAL
  Filled 2024-02-23: qty 1

## 2024-02-23 NOTE — ED Provider Notes (Signed)
 [place] EMERGENCY DEPARTMENT AT [facility] Provider Note  CSN: [id number] Arrival date & time: 02/23/24 1021  Chief Complaint(s) Dizziness  HPI Drew Adams is a 83 y.o. male who is here today for dizziness.  Patient reports that he woke up this morning, felt dizzy, says it was worse when he tried to stand up, move his head.  He received some meclizine  at triage and states this is improved.  Has no dizziness at rest at this time.   Past Medical History Past Medical History:  Diagnosis Date   BPH (benign prostatic hyperplasia)    Cardiac murmur    Cardiomegaly    Diverticulosis of colon without hemorrhage 11/28/2012   ED (erectile dysfunction)    Elevated blood pressure reading    Elevated PSA    Gout    uses prednisone  1-2x/week   Hypercholesterolemia    Hypertension    Iron deficiency anemia 11/28/2012   Mixed hyperlipidemia    Ocular migraine    Other fecal abnormalities    Other specified diseases of intestine    Pain in unspecified joint    Paraesophageal hiatal hernia 11/28/2012   Radiculopathy    Ulnar nerve abnormality    Ventricular hypertrophy    Patient Active Problem List   Diagnosis Date Noted   TIA (transient ischemic attack) 02/14/2019   CKD (chronic kidney disease) stage 2, GFR 60-89 ml/min 12/28/2012   Adenomatous polyp of sigmoid colon 11/28/2012   Iron deficiency anemia 11/28/2012   Paraesophageal hiatal hernia 11/28/2012   Diverticulosis of colon without hemorrhage 11/28/2012   Gout    Home Medication(s) Prior to Admission medications   Medication Sig Start Date End Date Taking? Authorizing Provider  meclizine  (ANTIVERT ) 25 MG tablet Take 1 tablet (25 mg total) by mouth 3 (three) times daily as needed for dizziness. 02/23/24  Yes Mannie Pac T, DO  allopurinol  (ZYLOPRIM ) 300 MG tablet Take 300 mg by mouth 2 (two) times daily.  11/15/12   [provider]  Ascorbic Acid  (VITAMIN C ) 1000 MG tablet Take 500 mg by mouth daily.      [provider]  calcium  citrate (CALCITRATE - DOSED IN MG ELEMENTAL CALCIUM ) 950 MG tablet Take 1 tablet by mouth daily. 600mg  3 times a week    [provider]  Cholecalciferol (VITAMIN D3) 10000 UNITS capsule Take 10,000 Units by mouth daily. 10000units 3x per week, 5000units 4x per week.    [provider]  finasteride (PROSCAR) 5 MG tablet Take 5 mg by mouth daily.    [provider]  magnesium oxide (MAG-OX) 400 MG tablet Take 400 mg by mouth daily.    [provider]  Multiple Vitamin (MULTIVITAMIN WITH MINERALS) TABS Take 1 tablet by mouth daily.    [provider]  OVER THE COUNTER MEDICATION 1 tablet daily. Trunature Prostate Health    [provider]  psyllium (METAMUCIL) 58.6 % packet Take 1 packet by mouth daily.    [provider]  PURE L-ARGININE HCL PO Take 1,800 mg by mouth.    [provider]  vitamin B-12 (CYANOCOBALAMIN) 500 MCG tablet Take 500 mcg by mouth daily.    [provider]  zinc  gluconate 50 MG tablet Take 30 mg by mouth daily.     [provider]  Past Surgical History Past Surgical History:  Procedure Laterality Date   CATARACT EXTRACTION Right 2012   CATARACT EXTRACTION Left Jan 2014   HERNIA REPAIR Right 1970s   open RIH repair   LAPAROSCOPIC PARTIAL COLECTOMY N/A 12/27/2012   Procedure: LAPAROSCOPIC  PARTIAL COLECTOMY RIGID PROCTOSCOPY ;  Surgeon: Elspeth KYM Schultze, MD;  Location: [facility]S;  Service: General;  Laterality: N/A;   LAPAROSCOPIC SIGMOID COLECTOMY  12/27/2012   TONSILLECTOMY AND ADENOIDECTOMY     Family History Family History  Problem Relation Age of Onset   Heart attack Mother    CAD Mother    Parkinson's disease Father    Diabetes Father    Cancer Sister    Diabetes Sister     Social History Social History    Tobacco Use   Smoking status: Never   Smokeless tobacco: Never  Vaping Use   Vaping status: Never Used  Substance Use Topics   Alcohol use: No   Drug use: No   Allergies Crestor  [rosuvastatin ], Exforge [amlodipine besylate-valsartan], and Simvastatin  Review of Systems Review of Systems  Physical Exam Vital Signs  I have reviewed the triage vital signs BP (!) 151/62   Pulse 61   Temp 97.9 F (36.6 C)   Resp 12   Ht 5' 10.5 (1.791 m)   Wt 62.6 kg   SpO2 100%   BMI 19.52 kg/m   Physical Exam Vitals and nursing note reviewed.  HENT:     Head: Normocephalic and atraumatic.     Mouth/Throat:     Mouth: Mucous membranes are moist.  Eyes:     Pupils: Pupils are equal, round, and reactive to light.     Comments: No nystagmus  Cardiovascular:     Rate and Rhythm: Normal rate.     Pulses: Normal pulses.     Comments: 2/6 systolic murmur Abdominal:     General: Abdomen is flat.  Musculoskeletal:        General: Normal range of motion.     Cervical back: Normal range of motion.  Skin:    Findings: No rash.  Neurological:     General: No focal deficit present.     Mental Status: He is alert.     Cranial Nerves: No cranial nerve deficit.     Motor: No weakness.     Gait: Gait normal.     Comments: No pronator drift.  5-5 strength in bilateral upper and lower extremities.  No truncal ataxia.     ED Results and Treatments Labs (all labs ordered are listed, but only abnormal results are displayed) Labs Reviewed  CBC WITH DIFFERENTIAL/PLATELET - Abnormal; Notable for the following components:      Result Value   Platelets 143 (*)    All other components within normal limits  BASIC METABOLIC PANEL WITH GFR - Abnormal; Notable for the following components:   Glucose, Bld 102 (*)    BUN 33 (*)    Creatinine, Ser 1.41 (*)    GFR, Estimated 50 (*)    All other components within normal limits  Radiology No results found.  Pertinent labs & imaging results that were available during my care of the patient were reviewed by me and considered in my medical decision making (see MDM for details).  Medications Ordered in ED Medications  meclizine  (ANTIVERT ) tablet 25 mg (25 mg Oral Given 02/23/24 1138)                                                                                                                                     Procedures Procedures  (including critical care time)  Medical Decision Making / ED Course   This patient presents to the ED for concern of dizziness, this involves an extensive number of treatment options, and is a complaint that carries with it a high risk of complications and morbidity.  The differential diagnosis includes peripheral vertigo, less likely central vertigo, considered near syncope.  MDM: Patient overall well-appearing, overall healthy.  He has recently begun counseling rather intensive diet and exercise routine, has intentionally lost 40 pounds over the last 3 to 4 months.  Patient walks over 2 miles each day.  Says that when he woke up this morning he felt dizzy.  He is not currently feeling dizzy.  He does not have any neurological deficits at this time.  Patient was able to ambulate in the emergency room without any difficulty.  The patient's absence of symptoms at this time, I do think posterior CVA is less likely.  I discussed this with the patient, he felt comfortable with discharge without additional workup.  His EKG, shows no evidence of ischemia.  His renal function is at baseline, no anemia.  He is appropriate for discharge   Additional history obtained: -Additional history obtained from daughter at bedside -External records from outside source obtained and reviewed including: Chart review including previous notes, labs, imaging, consultation notes   Lab Tests: -I ordered,  reviewed, and interpreted labs.   The pertinent results include:   Labs Reviewed  CBC WITH DIFFERENTIAL/PLATELET - Abnormal; Notable for the following components:      Result Value   Platelets 143 (*)    All other components within normal limits  BASIC METABOLIC PANEL WITH GFR - Abnormal; Notable for the following components:   Glucose, Bld 102 (*)    BUN 33 (*)    Creatinine, Ser 1.41 (*)    GFR, Estimated 50 (*)    All other components within normal limits      EKG my independent review of the patient's EKG shows no ST segment depressions or elevations, no T wave inversions, no evidence of acute ischemia.  EKG Interpretation Date/Time:  Wednesday February 23 2024 10:42:47 EDT Ventricular Rate:  71 PR Interval:  180 QRS Duration:  88 QT Interval:  370 QTC Calculation: 402 R Axis:   -9  Text Interpretation: Normal sinus rhythm Left ventricular hypertrophy with repolarization abnormality ( Sokolow-Lyon ) Abnormal ECG When compared with ECG  of 21-Dec-2012 09:44, PREVIOUS ECG IS PRESENT similar to prior Confirmed by Elnor Savant ([id number]) on 02/23/2024 10:44:59 AM        Medicines ordered and prescription drug management: Meds ordered this encounter  Medications   meclizine  (ANTIVERT ) tablet 25 mg   meclizine  (ANTIVERT ) 25 MG tablet    Sig: Take 1 tablet (25 mg total) by mouth 3 (three) times daily as needed for dizziness.    Dispense:  30 tablet    Refill:  0    -I have reviewed the patients home medicines and have made adjustments as needed   Cardiac Monitoring: The patient was maintained on a cardiac monitor.  I personally viewed and interpreted the cardiac monitored which showed an underlying rhythm of: Normal sinus rhythm  Social Determinants of Health:  Factors impacting patients care include: Lack of access to primary care   Reevaluation: After the interventions noted above, I reevaluated the patient and found that they have :improved  Co morbidities that complicate  the patient evaluation  Past Medical History:  Diagnosis Date   BPH (benign prostatic hyperplasia)    Cardiac murmur    Cardiomegaly    Diverticulosis of colon without hemorrhage 11/28/2012   ED (erectile dysfunction)    Elevated blood pressure reading    Elevated PSA    Gout    uses prednisone  1-2x/week   Hypercholesterolemia    Hypertension    Iron deficiency anemia 11/28/2012   Mixed hyperlipidemia    Ocular migraine    Other fecal abnormalities    Other specified diseases of intestine    Pain in unspecified joint    Paraesophageal hiatal hernia 11/28/2012   Radiculopathy    Ulnar nerve abnormality    Ventricular hypertrophy       Dispostion: I considered admission for this patient, however with his reassuring workup he is appropriate for discharge     Final Clinical Impression(s) / ED Diagnoses Final diagnoses:  Peripheral vertigo, unspecified laterality     @PCDICTATION @    Mannie Pac T, DO 02/23/24 1641

## 2024-02-23 NOTE — ED Provider Triage Note (Signed)
 Emergency Medicine Provider Triage Evaluation Note  Delio Slates , a 83 y.o. male  was evaluated in triage.  Pt complains of dizziness.  Patient reports he has been experiencing dizziness since around 4 AM this morning.  Dizziness is provoked by attempting to stand up and ambulate.  Position changes in his bed.  No vision changes, no numbness or weakness of extremities.  No head injury.  No history of similar symptoms in the past.  Review of Systems  Positive: Dizzy Negative: Weakness  Physical Exam  BP (!) 170/59 (BP Location: Right Arm)   Pulse 88   Temp 97.8 F (36.6 C)   Resp 18   Ht 5' 10.5 (1.791 m)   Wt 62.6 kg   SpO2 97%   BMI 19.52 kg/m  Gen:   Awake, no distress   Resp:  Normal effort  MSK:   Moves extremities without difficulty  Other:  Fatigable horizontal nystagmus noted.  Neurologic exam is intact, no visual field cuts, strength is symmetric bilateral lower extremities, coordination is intact  Medical Decision Making  Medically screening exam initiated at 11:31 AM.  Appropriate orders placed.  Bayley Hurn was informed that the remainder of the evaluation will be completed by another provider, this initial triage assessment does not replace that evaluation, and the importance of remaining in the ED until their evaluation is complete.     Elnor Jayson LABOR, DO 02/23/24 1132

## 2024-02-23 NOTE — ED Triage Notes (Signed)
 Pt. Stated, I woke up this morning with dizziness no matter if I'm sitting or standing. Ive had dizziness before but thiis was different I was even dizzy just lying in bed.

## 2024-02-23 NOTE — Discharge Instructions (Addendum)
 While you are in the emergency room, get an EKG done that was normal.  Your blood work overall was at your baseline.  Like we discussed, your symptoms are likely due to peripheral vertigo.  I have sent your prescription for medicine called meclizine .  You may begin taking this 3 times per day as needed for dizziness.  Your symptoms should begin to improve over the next several days.  Return to the emergency room if you develop persistent dizziness, inability to walk, persistent vomiting, trouble with your vision or weakness.

## 2024-03-28 DIAGNOSIS — I350 Nonrheumatic aortic (valve) stenosis: Secondary | ICD-10-CM | POA: Diagnosis not present

## 2024-03-28 LAB — BASIC METABOLIC PANEL WITH GFR
BUN/Creatinine Ratio: 20 (ref 10–24)
BUN: 30 mg/dL — ABNORMAL HIGH (ref 8–27)
CO2: 21 mmol/L (ref 20–29)
Calcium: 9.9 mg/dL (ref 8.6–10.2)
Chloride: 102 mmol/L (ref 96–106)
Creatinine, Ser: 1.51 mg/dL — ABNORMAL HIGH (ref 0.76–1.27)
Glucose: 88 mg/dL (ref 70–99)
Potassium: 4.3 mmol/L (ref 3.5–5.2)
Sodium: 143 mmol/L (ref 134–144)
eGFR: 46 mL/min/1.73 — ABNORMAL LOW (ref >59)

## 2024-03-29 ENCOUNTER — Ambulatory Visit: Payer: Self-pay | Admitting: Physician Assistant

## 2024-03-30 NOTE — Telephone Encounter (Signed)
Patient called to follow-up on his lab test results.

## 2024-04-11 ENCOUNTER — Ambulatory Visit (HOSPITAL_BASED_OUTPATIENT_CLINIC_OR_DEPARTMENT_OTHER)
Admission: RE | Admit: 2024-04-11 | Discharge: 2024-04-11 | Disposition: A | Discharge status: Home / Self Care | Source: Ambulatory Visit | Attending: Cardiovascular Disease | Admitting: Cardiovascular Disease

## 2024-04-11 ENCOUNTER — Ambulatory Visit (HOSPITAL_COMMUNITY)
Admission: RE | Admit: 2024-04-11 | Discharge: 2024-04-11 | Disposition: A | Discharge status: Home / Self Care | Source: Ambulatory Visit | Attending: Cardiovascular Disease | Admitting: Cardiovascular Disease

## 2024-04-11 DIAGNOSIS — I2510 Atherosclerotic heart disease of native coronary artery without angina pectoris: Secondary | ICD-10-CM | POA: Diagnosis not present

## 2024-04-11 DIAGNOSIS — Z48812 Encounter for surgical aftercare following surgery on the circulatory system: Secondary | ICD-10-CM | POA: Diagnosis not present

## 2024-04-11 DIAGNOSIS — I350 Nonrheumatic aortic (valve) stenosis: Secondary | ICD-10-CM

## 2024-04-11 DIAGNOSIS — I700 Atherosclerosis of aorta: Secondary | ICD-10-CM | POA: Diagnosis not present

## 2024-04-11 DIAGNOSIS — N400 Enlarged prostate without lower urinary tract symptoms: Secondary | ICD-10-CM | POA: Diagnosis not present

## 2024-04-11 DIAGNOSIS — I080 Rheumatic disorders of both mitral and aortic valves: Secondary | ICD-10-CM | POA: Insufficient documentation

## 2024-04-11 DIAGNOSIS — I7121 Aneurysm of the ascending aorta, without rupture: Secondary | ICD-10-CM | POA: Insufficient documentation

## 2024-04-11 DIAGNOSIS — Z01818 Encounter for other preprocedural examination: Secondary | ICD-10-CM | POA: Diagnosis not present

## 2024-04-11 LAB — ECHOCARDIOGRAM COMPLETE
AR max vel: 0.78 cm2
AV Area VTI: 0.89 cm2
AV Area mean vel: 0.88 cm2
AV Mean grad: 26.8 mmHg
AV Peak grad: 63.4 mmHg
Ao pk vel: 3.98 m/s
MV VTI: 1.27 cm2
S' Lateral: 3.67 cm

## 2024-04-11 MED ORDER — IOHEXOL 350 MG/ML SOLN
100.0000 mL | Freq: Once | INTRAVENOUS | Status: AC | PRN
  Administered 2024-04-11: 100 mL via INTRAVENOUS

## 2024-04-12 ENCOUNTER — Encounter: Payer: Self-pay | Admitting: Physician Assistant

## 2024-04-19 ENCOUNTER — Ambulatory Visit: Attending: Surgery | Admitting: Surgery

## 2024-04-19 VITALS — BP 170/62 | HR 82 | Resp 20 | Ht 70.5 in

## 2024-04-19 DIAGNOSIS — I712 Thoracic aortic aneurysm, without rupture, unspecified: Secondary | ICD-10-CM

## 2024-04-19 DIAGNOSIS — I350 Nonrheumatic aortic (valve) stenosis: Secondary | ICD-10-CM

## 2024-04-19 NOTE — Progress Notes (Unsigned)
 Procedure Type: Isolated AVR  Perioperative Outcome Estimate %  Operative Mortality 3.5%  Morbidity & Mortality 12.3%  Stroke 1.57%  Renal Failure 2.05%  Reoperation 5.06%  Prolonged Ventilation 5.55%  Deep Sternal Wound Infection 0.047%  Long Hospital Stay (>14 days) 5.24%  Short Hospital Stay (<6 days)* 40%

## 2024-04-19 NOTE — Progress Notes (Unsigned)
 Patient ID: Drew Adams, male   DOB: 1941/06/22, 83 y.o.   MRN: [id number]   [facility]       [address]       [place] [address]             [phone]          CARDIOTHORACIC SURGERY CONSULTATION REPORT  PCP is Delice Charleston, MD (Inactive) Referring Provider is Ozell Fell, MD Primary Cardiologist is Candyce Reek, MD (retired)  Reason for consultation:  Severe aortic stenosis  HPI:  The patient is an 83 year old gentleman with a history of hypertension, hyperlipidemia, gout, stage IIIb chronic kidney disease, bicuspid aortic valve stenosis and insufficiency as well as a 4.6 cm fusiform ascending aortic aneurysm who was referred for consideration of TAVR.  He had an echo on 07/04/2021 showing a mean gradient of 33 mmHg and a peak gradient of 58 mmHg with a valve area of 1.0 cm by VTI and a dimensionless index of 0.28 consistent with moderate to severe aortic stenosis.  He also had moderate aortic insufficiency and moderate mitral regurgitation.  The echo also showed a 4.8 cm fusiform ascending aortic aneurysm.  Left ventricular ejection fraction was 60 to 65%.  He was feeling well when I saw him for initial consultation on 08/01/2021 without any symptoms at all and was quite active.  After discussion with the patient and his wife we decided to continue following this with a repeat CTA of the chest and echo in 6 months.  I last saw him on 10/13/2023 and his wife had subsequently become ill and died since our last visit in 22-Aug-2023so he had not been back for follow-up since then.  When I saw him in March the mean gradient across the valve was 38 mmHg with a valve area of 0.8 cm and dimensionless index of 0.22.  There was moderate aortic insufficiency with normal left ventricular ejection fraction and stable LV diastolic dimension of 5.45 cm.  He remained fairly active and asymptomatic.  He wanted to continue  following this and we thought that was reasonable.  His most recent echocardiogram on 04/11/2024 showed a severely calcified aortic valve with a mean pressure gradient of 34 mmHg, valve area of 0.9 cm, and dimensionless index of 0.24.  There was mild mitral stenosis with a mean gradient of 4 mmHg and moderate mitral regurgitation.  He is here today by himself.  He continues to deny any chest pain or shortness of breath.  He feels like his stamina is good.  He did have a recent emergency room visit on 02/23/2024 for dizziness that was present when he woke up in the morning and worsen when he tried to stand up and move his head.  He received some meclizine  in triage and this seemed to improve his dizziness.  His blood pressure in the emergency room was 151/62 with a pulse of 61.  He says that since then he has had occasional episodes of dizziness with standing up from laying down or sitting.  He denies any peripheral edema.  He denies any orthopnea.  He said that he has been strictly maintaining an Atkins diet and has intentionally lost about 40 pounds over the last 3 to 4 months.  He continues to walk 2 miles daily.  Past Medical History:  Diagnosis Date   Aortic valve disease    BPH (benign prostatic hyperplasia)    Diverticulosis of  colon without hemorrhage 11/28/2012   ED (erectile dysfunction)    Elevated PSA    Gout    uses prednisone  1-2x/week   Hypertension    Iron deficiency anemia 11/28/2012   Mixed hyperlipidemia    Ocular migraine    Paraesophageal hiatal hernia 11/28/2012   Radiculopathy    Thoracic aortic aneurysm (TAA)     Past Surgical History:  Procedure Laterality Date   CATARACT EXTRACTION Right 2012   CATARACT EXTRACTION Left Jan 2014   HERNIA REPAIR Right 1970s   open RIH repair   LAPAROSCOPIC PARTIAL COLECTOMY N/A 12/27/2012   Procedure: LAPAROSCOPIC  PARTIAL COLECTOMY RIGID PROCTOSCOPY ;  Surgeon: Elspeth KYM Schultze, MD;  Location: [facility];  Service: General;  Laterality:  N/A;   LAPAROSCOPIC SIGMOID COLECTOMY  12/27/2012   TONSILLECTOMY AND ADENOIDECTOMY      Family History  Problem Relation Age of Onset   Heart attack Mother    CAD Mother    Parkinson's disease Father    Diabetes Father    Cancer Sister    Diabetes Sister     Social History   Socioeconomic History   Marital status: Married    Spouse name: Aldona   Number of children: 2   Years of education: Not on file   Highest education level: Bachelor's degree (e.g., BA, AB, BS)  Occupational History   Occupation: retired   Tobacco Use   Smoking status: Never   Smokeless tobacco: Never  Vaping Use   Vaping status: Never Used  Substance and Sexual Activity   Alcohol use: No   Drug use: No   Sexual activity: Not on file  Other Topics Concern   Not on file  Social History Narrative   Right handed   Caffeine none   Lives tat home with spouse Aldona    Social Drivers of Health   Financial Resource Strain: Not on file  Food Insecurity: Not on file  Transportation Needs: Not on file  Physical Activity: Not on file  Stress: Not on file  Social Connections: Not on file  Intimate Partner Violence: Not on file    Prior to Admission medications   Medication Sig Start Date End Date Taking? Authorizing Provider  allopurinol  (ZYLOPRIM ) 300 MG tablet Take 300 mg by mouth 2 (two) times daily.  11/15/12  Yes [provider]  Ascorbic Acid  (VITAMIN C ) 1000 MG tablet Take 500 mg by mouth daily.    Yes [provider]  calcium  citrate (CALCITRATE - DOSED IN MG ELEMENTAL CALCIUM ) 950 MG tablet Take 1 tablet by mouth daily. 600mg  3 times a week   Yes [provider]  Cholecalciferol (VITAMIN D3) 10000 UNITS capsule Take 10,000 Units by mouth daily. 10000units 3x per week, 5000units 4x per week.   Yes [provider]  finasteride (PROSCAR) 5 MG tablet Take 5 mg by mouth daily.   Yes [provider]  magnesium oxide (MAG-OX) 400 MG tablet Take 400 mg by  mouth daily.   Yes [provider]  meclizine  (ANTIVERT ) 25 MG tablet Take 1 tablet (25 mg total) by mouth 3 (three) times daily as needed for dizziness. 02/23/24  Yes Mannie Pac T, DO  Multiple Vitamin (MULTIVITAMIN WITH MINERALS) TABS Take 1 tablet by mouth daily.   Yes [provider]  OVER THE COUNTER MEDICATION 1 tablet daily. Trunature Prostate Health   Yes [provider]  psyllium (METAMUCIL) 58.6 % packet Take 1 packet by mouth daily.   Yes [provider]  PURE L-ARGININE HCL PO Take 1,800 mg by mouth.   Yes [provider]  vitamin B-12 (CYANOCOBALAMIN) 500 MCG tablet Take 500 mcg by mouth daily.   Yes [provider]  zinc  gluconate 50 MG tablet Take 30 mg by mouth daily.    Yes [provider]    Current Outpatient Medications  Medication Sig Dispense Refill   allopurinol  (ZYLOPRIM ) 300 MG tablet Take 300 mg by mouth 2 (two) times daily.      Ascorbic Acid  (VITAMIN C ) 1000 MG tablet Take 500 mg by mouth daily.      calcium  citrate (CALCITRATE - DOSED IN MG ELEMENTAL CALCIUM ) 950 MG tablet Take 1 tablet by mouth daily. 600mg  3 times a week     Cholecalciferol (VITAMIN D3) 10000 UNITS capsule Take 10,000 Units by mouth daily. 10000units 3x per week, 5000units 4x per week.     finasteride (PROSCAR) 5 MG tablet Take 5 mg by mouth daily.     magnesium oxide (MAG-OX) 400 MG tablet Take 400 mg by mouth daily.     meclizine  (ANTIVERT ) 25 MG tablet Take 1 tablet (25 mg total) by mouth 3 (three) times daily as needed for dizziness. 30 tablet 0   Multiple Vitamin (MULTIVITAMIN WITH MINERALS) TABS Take 1 tablet by mouth daily.     OVER THE COUNTER MEDICATION 1 tablet daily. Trunature Prostate Health     psyllium (METAMUCIL) 58.6 % packet Take 1 packet by mouth daily.     PURE L-ARGININE HCL PO Take 1,800 mg by mouth.     vitamin B-12 (CYANOCOBALAMIN) 500 MCG tablet Take 500 mcg by mouth daily.     zinc  gluconate 50 MG tablet  Take 30 mg by mouth daily.      No current facility-administered medications for this visit.    Allergies  Allergen Reactions   Crestor  [Rosuvastatin ] Other (See Comments)    Leg pain and cramping   Exforge [Amlodipine Besylate-Valsartan]    Simvastatin       Review of Systems:   General:  normal appetite, normal energy, no weight gain, + intentional weight loss, no fever  Cardiac:  no chest pain with exertion, no chest pain at rest, no SOB with exertion, no resting SOB, no PND, no orthopnea, no palpitations, no arrhythmia, no atrial fibrillation, no LE edema, + dizzy spells, no syncope  Respiratory:  no shortness of breath, no home oxygen, no productive cough, no dry cough, no bronchitis, no wheezing, no hemoptysis, no asthma, no pain with inspiration or cough, no sleep apnea, no CPAP at night  GI:   no difficulty swallowing, no reflux, no frequent heartburn, no hiatal hernia, no abdominal pain, no constipation, no diarrhea, no hematochezia, no hematemesis, no melena  GU:   no dysuria,  no frequency, no urinary tract infection, no hematuria, + enlarged prostate, no kidney stones, + chronic kidney disease  Vascular:  no pain suggestive of claudication, no pain in feet, no leg cramps, no varicose veins, no DVT, no non-healing foot ulcer  Neuro:   no stroke, no TIA's, no seizures, no headaches, no temporary blindness one eye,  no slurred speech, no peripheral neuropathy, no chronic pain, no instability of gait, no memory/cognitive dysfunction  Musculoskeletal: no arthritis, no joint swelling, no myalgias, no difficulty walking, + normal mobility   Skin:   no rash, no itching, no skin infections, no pressure sores or ulcerations  Psych:   no anxiety, no depression, no nervousness, no unusual recent  stress  Eyes:   no blurry vision, no floaters, no recent vision changes, no glasses or contacts  ENT:   no hearing loss, no loose or painful teeth, no dentures, last saw dentist  02/2024  Hematologic:  no easy bruising, no abnormal bleeding, no clotting disorder, no frequent epistaxis  Endocrine:  no diabetes, does not check CBG's at home     Physical Exam:   BP (!) 170/62   Pulse 82   Resp 20   Ht 5' 10.5 (1.791 m)   SpO2 96% Comment: RA  BMI 19.52 kg/m   General:  Elderly,  well-appearing  HEENT:  Unremarkable, NCAT, PERLA, EOMI  Neck:   no JVD, no bruits, no adenopathy   Chest:   clear to auscultation, symmetrical breath sounds, no wheezes, no rhonchi   CV:   RRR, 3/6 systolic murmur RSB, no diastolic murmur  Abdomen:  soft, non-tender, no masses   Extremities:  warm, well-perfused, pulses palpable, trace bilateral lower extremity edema  Rectal/GU  Deferred  Neuro:   Grossly non-focal and symmetrical throughout  Skin:   Clean and dry, no rashes, no breakdown  Diagnostic Tests:  ECHOCARDIOGRAM REPORT       Patient Name:   VALLEN CALABRESE  Date of Exam: 04/11/2024  Medical Rec #:  [id number]     Height:       70.5 in  Accession #:    [id number]    Weight:       138.0 lb  Date of Birth:  March 10, 1941     BSA:          1.792 m  Patient Age:    83 years      BP:           174/71 mmHg  Patient Gender: M             HR:           71 bpm.  Exam Location:  [address]   Procedure: 2D Echo, Cardiac Doppler and Color Doppler (Both Spectral and  Color            Flow Doppler were utilized during procedure).   Indications:    Nonrheumatic Aortic valve stenosis I35.0    History:        Patient has prior history of Echocardiogram examinations.  Risk                 Factors:Dyslipidemia, Hypertension and High Cholesterol.    Sonographer:    Cherene Ravens RDCS  Referring Phys: [id number] KATHRYN R THOMPSON   IMPRESSIONS     1. Left ventricular ejection fraction, by estimation, is 55 to 60%. The  left ventricle has normal function. The left ventricle has no regional  wall motion abnormalities. Left ventricular diastolic parameters are  consistent with Grade I  diastolic  dysfunction (impaired relaxation).   2. Right ventricular systolic function is normal. The right ventricular  size is normal. Tricuspid regurgitation signal is inadequate for assessing  PA pressure.   3. Left atrial size was moderately dilated.   4. The mitral valve is degenerative. Moderate mitral valve regurgitation.  Mild mitral stenosis. The mean mitral valve gradient is 4.0 mmHg with  average heart rate of 79 bpm. Severe mitral annular calcification.   5. The aortic valve is calcified. There is severe calcifcation of the  aortic valve. Aortic valve regurgitation is moderate to severe. Severe  aortic valve stenosis. Vmax 4.0 m/s, MG 34 mmHg, AVA 0.9 cm^2,  DI 0.24   6. The inferior vena cava is normal in size with greater than 50%  respiratory variability, suggesting right atrial pressure of 3 mmHg.   FINDINGS   Left Ventricle: Left ventricular ejection fraction, by estimation, is 55  to 60%. The left ventricle has normal function. The left ventricle has no  regional wall motion abnormalities. The left ventricular internal cavity  size was normal in size. There is   no left ventricular hypertrophy. Left ventricular diastolic parameters  are consistent with Grade I diastolic dysfunction (impaired relaxation).   Right Ventricle: The right ventricular size is normal. No increase in  right ventricular wall thickness. Right ventricular systolic function is  normal. Tricuspid regurgitation signal is inadequate for assessing PA  pressure.   Left Atrium: Left atrial size was moderately dilated.   Right Atrium: Right atrial size was normal in size.   Pericardium: There is no evidence of pericardial effusion.   Mitral Valve: The mitral valve is degenerative in appearance. Severe  mitral annular calcification. Moderate mitral valve regurgitation. Mild  mitral valve stenosis. MV peak gradient, 10.0 mmHg. The mean mitral valve  gradient is 4.0 mmHg with average heart   rate of  79 bpm.   Tricuspid Valve: The tricuspid valve is normal in structure. Tricuspid  valve regurgitation is trivial.   Aortic Valve: The aortic valve is calcified. There is severe calcifcation  of the aortic valve. Aortic valve regurgitation is moderate to severe.  Severe aortic stenosis is present. Aortic valve mean gradient measures  26.8 mmHg. Aortic valve peak gradient  measures 63.4 mmHg. Aortic valve area, by VTI measures 0.89 cm.   Pulmonic Valve: The pulmonic valve was not well visualized. Pulmonic valve  regurgitation is trivial.   Aorta: The aortic root and ascending aorta are structurally normal, with  no evidence of dilitation.   Venous: The inferior vena cava is normal in size with greater than 50%  respiratory variability, suggesting right atrial pressure of 3 mmHg.   IAS/Shunts: The atrial septum is grossly normal.     LEFT VENTRICLE  PLAX 2D  LVIDd:         5.25 cm   Diastology  LVIDs:         3.67 cm   LV e' medial:    5.97 cm/s  LV PW:         1.04 cm   LV E/e' medial:  12.6  LV IVS:        0.91 cm   LV e' lateral:   8.21 cm/s  LVOT diam:     1.90 cm   LV E/e' lateral: 9.2  LV SV:         63  LV SV Index:   35  LVOT Area:     2.84 cm     RIGHT VENTRICLE          IVC  RV Basal diam:  2.57 cm  IVC diam: 1.33 cm  RV Mid diam:    1.57 cm  TAPSE (M-mode): 2.5 cm   LEFT ATRIUM             Index        RIGHT ATRIUM           Index  LA diam:        4.00 cm 2.23 cm/m   RA Pressure: 3.00 mmHg  LA Vol (A2C):   94.8 ml 52.90 ml/m  RA Area:     12.80 cm  LA  Vol (A4C):   60.8 ml 33.92 ml/m  RA Volume:   27.40 ml  15.29 ml/m  LA Biplane Vol: 78.8 ml 43.97 ml/m   AORTIC VALVE  AV Area (Vmax):    0.78 cm  AV Area (Vmean):   0.88 cm  AV Area (VTI):     0.89 cm  AV Vmax:           398.00 cm/s  AV Vmean:          240.000 cm/s  AV VTI:            0.704 m  AV Peak Grad:      63.4 mmHg  AV Mean Grad:      26.8 mmHg  LVOT Vmax:         110.00 cm/s  LVOT Vmean:         74.500 cm/s  LVOT VTI:          0.222 m  LVOT/AV VTI ratio: 0.32    AORTA  Ao Root diam: 3.40 cm  Ao Asc diam:  3.60 cm   MITRAL VALVE                TRICUSPID VALVE  MV Area VTI:  1.27 cm      Estimated RAP:  3.00 mmHg  MV Peak grad: 10.0 mmHg  MV Mean grad: 4.0 mmHg      SHUNTS  MV Vmax:      1.58 m/s      Systemic VTI:  0.22 m  MV Vmean:     85.2 cm/s     Systemic Diam: 1.90 cm  MV E velocity: 75.30 cm/s  MV A velocity: 146.00 cm/s  MV E/A ratio:  0.52   Lonni Nanas MD  Electronically signed by Lonni Nanas MD  Signature Date/Time: 04/11/2024/10:17:38 AM        Final      ADDENDUM REPORT: 04/14/2024 13:27   EXAM: OVER-READ INTERPRETATION  CT CHEST   The following report is an over-read performed by radiologist Dr. Fonda Mom [facility] on 04/14/2024. This over-read does not include interpretation of cardiac or coronary anatomy or pathology. The coronary CTA interpretation by the cardiologist is attached.   COMPARISON:  04/11/2024.   FINDINGS: Cardiovascular:  See findings discussed in the body of the report.   Mediastinum/Nodes: No suspicious adenopathy identified. Imaged mediastinal structures are unremarkable.   Lungs/Pleura: Imaged lungs are clear. No pleural effusion or pneumothorax.   Upper Abdomen: No acute abnormality.   Musculoskeletal: No chest wall abnormality. No acute osseous findings. Thoracic degenerative changes.   IMPRESSION: No acute extracardiac incidental findings.     Electronically Signed   By: Fonda Field M.D.   On: 04/14/2024 13:27    Addended by Field Fonda BROCKS, MD on 04/14/2024  1:29 PM    Study Result  Narrative & Impression  CLINICAL DATA:  Severe Aortic Stenosis.  2023 study for comparison.   EXAM: Cardiac TAVR CT   TECHNIQUE: A non-contrast, gated CT scan was obtained with axial slices of 2.5 mm through the heart for aortic valve scoring. A 120  kV retrospective, gated, contrast cardiac scan was obtained. Gantry rotation speed was 230 msec and collimation was 0.63 mm. Nitroglycerin was not given. A delayed scan was obtained to exclude left atrial appendage thrombus. The 3D dataset was reconstructed in systole with motion correction. The 3D data set was reconstructed in 5% intervals of the 0-95% of the R-R cycle. Systolic and diastolic  phases were analyzed on a dedicated workstation using MPR, MIP, and VRT modes. The patient received 100 cc of contrast.   FINDINGS: Aortic Valve: Valve Morphology: Tricuspid with asymmetric, bulky calcification and reduced excursion the planimeter valve area is 1.19 Sq cm consistent with severe aortic stenosis   Annular and subannular calcification: Severe annular, crescentic, posterior calcification that extends into the LVOT and the intervalvular fibrosa.   Aortic Valve Calcium  Score: 6766   Presence of basal septal hypertrophy: Severe, but diastolic annulus is less that systolic annulus   Perimembranous septal diameter: 5 mm   Mitral Valve: Severe mitral annular calcification.   Aortic Annulus Measurements-31% Phase   Major annulus diameter: 31 mm   Minor annulus diameter: 26 mm   Annular perimeter: 90 mm   Annular area: 6.34 cm2   Aortic Measurements- 75% phase   Sinotubular Junction: 37 mm   Ascending Thoracic Aorta: 46 mm aneurysmal   Descending Thoracic Aorta: 27 mm   Aortic atherosclerosis.   Sinus of Valsalva Measurements:   Right coronary cusp width: 35 mm   Left coronary cusp width: 35 mm   Non coronary cusp width: 35 mm   Coronary Artery Height above Annulus:   Left Main: 14 mm   Left SoV height: 20 mm   Right Coronary: 14 mm   Right SoV height: 19 mm   Optimum Fluoroscopic Angle for Delivery: LAO 5, CAU 3   Valves for structural team consideration:   29 mm Sapien recommended in the setting of CAD.   34 mm Evolut is feasible   Non TAVR Valve  Findings:   Coronary Arteries: Normal coronary origin. The study was performed without use of NTG and is insufficient for full plaque evaluation. The following assessment was made of the proximal segments:   Left main: The left main is a large caliber vessel with a normal take off from the left coronary cusp that bifurcates into a LAD & LCX. There is no plaque or stenosis.   LAD: Moderate calcified plaque stenosis on non-optimized study.   LCX: Moderate calcified plaque stenosis on non-optimized study.   RCA: Moderate calcified plaque stenosis on non-optimized study.   Consider heart catheterization as part of TAVR work up.   Systemic veins: Normal anatomy.   Main Pulmonary artery: Normal caliber   Pulmonary veins: Normal variant anatomy- right middle pulmonary vein.   Left atrial appendage: Patent.   Interatrial septum: No clear communications   Chamber dimensions: Right ventricular dilation, left atrial dimension, papillary muscle hypertrophy.   Pericardium: No significant calcifications.   Extra Cardiac Findings as per separate reporting.   Image quality: Excellent.   Noise artifact is: Limited.   IMPRESSION: 1. Severe Aortic stenosis. Measurements for potential interventions as above. Sizing recommendations unchanged from 2023 study.   Stanly Leavens MD   Electronically Signed: By: Stanly Leavens M.D. On: 04/11/2024 18:52      Narrative & Impression  CLINICAL DATA:  Aortic valve replacement, preoperative evaluation   EXAM: CTA ABDOMEN AND PELVIS WITHOUT AND WITH CONTRAST   TECHNIQUE: Multidetector CT imaging of the abdomen and pelvis was performed using the standard protocol during bolus administration of intravenous contrast. Multiplanar reconstructed images and MIPs were obtained and reviewed to evaluate the vascular anatomy.   RADIATION DOSE REDUCTION: This exam was performed according to the departmental dose-optimization program  which includes automated exposure control, adjustment of the mA and/or kV according to patient size and/or use of iterative reconstruction technique.   CONTRAST:  OMNIPAQUE  IOHEXOL  350 MG/ML SOLN   COMPARISON:  None Available.   FINDINGS: VASCULAR   Aorta: Mild diffuse atherosclerotic changes are present in the abdominal aorta with mild ectasia. The infrarenal segment measures 2.6 cm in diameter. Minimal luminal diameter measures 13 mm.   Celiac: Patent.   SMA: Patent.   Renals: Mild atherosclerotic changes bilaterally, patent.   IMA: Likely occluded at the origin.   Inflow: Right common iliac artery is mildly dilated in the distal segment measuring 1.4 cm. Minimal luminal dimension measures 8 mm. The right external iliac artery is patent. The right internal iliac artery is patent. Minimal diameter of the right external iliac artery measures 7 mm.   The left common iliac artery is patent with minimal luminal diameter of 9 mm. The distal left common iliac artery is ectatic measuring 1.3 cm. Left internal and external iliac arteries are patent. Mild atherosclerotic changes in the left external iliac artery with minimal luminal diameter measuring 8 mm.   Proximal Outflow: The right common femoral artery is patent with mild atherosclerotic changes and minimal luminal diameter of 7 mm. Left common femoral artery is patent with mild atherosclerotic changes and minimal luminal diameter of 7 mm.   Veins: No obvious venous abnormality within the limitations of this arterial phase study.   Review of the MIP images confirms the above findings.   NON-VASCULAR   Lower chest: Reported separately.   Hepatobiliary: No intrahepatic or extrahepatic biliary ductal dilatation. The gallbladder is grossly unremarkable.   Pancreas: Unremarkable. No pancreatic ductal dilatation or surrounding inflammatory changes.   Spleen: Normal in size without focal abnormality.    Adrenals/Urinary Tract: The adrenal glands are grossly unremarkable. Cortical thinning is present in both kidneys. There is no overt hydronephrosis. Calcifications are present in the urinary bladder. Small simple cysts are suspected in the kidneys with the largest in the left kidney measuring 1.6 cm.   Stomach/Bowel: The stomach is nondilated. No dilated loops of bowel are appreciated. An enteric anastomosis is present in the distal left colon. Scattered colonic diverticulosis.   Lymphatic: No significant lymphadenopathy.   Reproductive: Markedly enlarged prostate with mass effect on the pelvic organs.   Other: No abdominal wall hernia or abnormality. No abdominopelvic ascites.   Musculoskeletal: Degenerative changes in the imaged osseous structures.   IMPRESSION: 1. Minimal luminal diameters detailed above.     Electronically Signed   By: Maude Naegeli M.D.   On: 04/12/2024 09:00      Impression:  This 82 year old gentleman has stage D, severe aortic stenosis with recent episodes of dizziness with position changes which sound like they are likely related to his aortic stenosis.  He has been walking 2 miles per day and denies any exertional fatigue or shortness of breath although he has been fairly asymptomatic all along despite being active.  I have personally reviewed his 2D echocardiogram and CTA studies.  His echocardiogram shows a severely calcified aortic valve with markedly restricted leaflet mobility.  The mean gradient is 34 mmHg with a valve area of 0.9 cm and dimensionless index of 0.24.  He also has moderate to severe aortic insufficiency, severe mitral annular calcification with mild mitral stenosis with a mean gradient of 4 mmHg and moderate mitral regurgitation.  Left ventricular ejection fraction is 50 to 55% with a low stroke-volume index of 35.  His valve looks severely stenotic. I think aortic valve replacement is indicated in this patient to prevent progressive  left ventricular dysfunction and development of  congestive heart failure symptoms.  Given his advanced age I think transcatheter aortic valve replacement would be the best option for treating him.  His gated cardiac CTA shows anatomy suitable for TAVR using a 29 mm SAPIEN 3 valve.  His abdominal and pelvic CTA shows adequate pelvic vascular anatomy to allow transfemoral insertion.  He has a 4.6 cm fusiform ascending aortic aneurysm that is still well below the surgical threshold and can be followed.  He has not had a cardiac catheterization yet.  The patient was counseled at length regarding treatment alternatives for management of severe symptomatic aortic stenosis. The risks and benefits of surgical intervention has been discussed in detail. Long-term prognosis with medical therapy was discussed. Alternative approaches such as conventional surgical aortic valve replacement, transcatheter aortic valve replacement, and palliative medical therapy were compared and contrasted at length. This discussion was placed in the context of the patient's own specific clinical presentation and past medical history. All of his questions have been addressed.  He would like to continue thinking about proceeding with TAVR.   Plan:  He is going to think about proceeding with TAVR at this time and feels that he will likely proceed. We will have our Nurse Navigator contact him to see what he decides.   I spent 60 minutes performing this consultation and > 50% of this time was spent face to face counseling and coordinating the care of this patient's severe aortic stenosis.   Dorise LOIS Fellers, MD 04/19/2024

## 2024-04-25 NOTE — H&P (View-Only) (Signed)
 [facility] Consult Note  No chief complaint on file.  History of Present Illness: 83 yo male with history of HTN, HLD, CKD stage IIIb, thoracic aortic aneurysm and bicuspid aortic valve with aortic stenosis and aortic insufficiency who is here today as a new consult, referred by Dr. Lucas, for further discussion regarding his aortic valve disease and possible TAVR. His aortic stenosis had been followed by Dr. Dann. Echo in 2022 with moderate aortic stenosis, moderate aortic insufficiency and moderate mitral regurgitation. Ascending aorta dilated at 4.8 cm in 2022. Most recent CTA with 4.6 cm ascending aortic aneurysm. He has CKD, stage IIIb with most creatinine of 1.5 and GFR of 46. He has been followed in our CT surgery office by Dr. Lucas since January 2023. Echo September 2025 with LVEF=55-60%. Normal RV function. Moderate mitral regurgitation. Mild mitral stenosis. Severe aortic stenosis with mean gradient 34 mmHg, AVA 0.78 cm2, DI 0.24, SVI 35. Moderate to severe aortic valve insufficiency. Cardiac CT with annular area of 634 mm2 suitable for a 29 mm Edwards Sapien 3 valve. Chest/abdomen/pelvis CTA 04/12/24 with favorable anatomy for transfemoral approach to TAVR. He was seen on 04/19/24 by Dr. Lucas and reported dizziness, near syncope at home.   He tells me today that he is very active and has no dyspnea or chest pain. He denies LE edema. He has had dizziness but no syncope. *** He lives in *** He is retired Engineer, manufacturing ***  Primary Care Physician: Delice Charleston, MD (Inactive) Primary Cardiologist: Former Dann Referring: Dorise Lucas, MD  Past Medical History:  Diagnosis Date   Aortic valve disease    BPH (benign prostatic hyperplasia)    Diverticulosis of colon without hemorrhage 11/28/2012   ED (erectile dysfunction)    Elevated PSA    Gout    uses prednisone  1-2x/week   Hypertension    Iron deficiency anemia 11/28/2012   Mitral regurgitation    Mixed  hyperlipidemia    Ocular migraine    Paraesophageal hiatal hernia 11/28/2012   Radiculopathy    Thoracic aortic aneurysm (TAA)     Past Surgical History:  Procedure Laterality Date   CATARACT EXTRACTION Right 2012   CATARACT EXTRACTION Left Jan 2014   HERNIA REPAIR Right 1970s   open RIH repair   LAPAROSCOPIC PARTIAL COLECTOMY N/A 12/27/2012   Procedure: LAPAROSCOPIC  PARTIAL COLECTOMY RIGID PROCTOSCOPY ;  Surgeon: Elspeth KYM Schultze, MD;  Location: [facility];  Service: General;  Laterality: N/A;   LAPAROSCOPIC SIGMOID COLECTOMY  12/27/2012   TONSILLECTOMY AND ADENOIDECTOMY      Current Outpatient Medications  Medication Sig Dispense Refill   allopurinol  (ZYLOPRIM ) 300 MG tablet Take 300 mg by mouth 2 (two) times daily.      Ascorbic Acid  (VITAMIN C ) 1000 MG tablet Take 500 mg by mouth daily.      calcium  citrate (CALCITRATE - DOSED IN MG ELEMENTAL CALCIUM ) 950 MG tablet Take 1 tablet by mouth daily. 600mg  3 times a week     Cholecalciferol (VITAMIN D3) 10000 UNITS capsule Take 10,000 Units by mouth daily. 10000units 3x per week, 5000units 4x per week.     finasteride (PROSCAR) 5 MG tablet Take 5 mg by mouth daily.     magnesium oxide (MAG-OX) 400 MG tablet Take 400 mg by mouth daily.     meclizine  (ANTIVERT ) 25 MG tablet Take 1 tablet (25 mg total) by mouth 3 (three) times daily as needed for dizziness. 30 tablet 0   Multiple Vitamin (MULTIVITAMIN  WITH MINERALS) TABS Take 1 tablet by mouth daily.     OVER THE COUNTER MEDICATION 1 tablet daily. Trunature Prostate Health     psyllium (METAMUCIL) 58.6 % packet Take 1 packet by mouth daily.     PURE L-ARGININE HCL PO Take 1,800 mg by mouth.     vitamin B-12 (CYANOCOBALAMIN) 500 MCG tablet Take 500 mcg by mouth daily.     zinc  gluconate 50 MG tablet Take 30 mg by mouth daily.      No current facility-administered medications for this visit.    Allergies  Allergen Reactions   Crestor  [Rosuvastatin ] Other (See Comments)    Leg pain and  cramping   Exforge [Amlodipine Besylate-Valsartan]    Simvastatin     Social History   Socioeconomic History   Marital status: Married    Spouse name: Aldona   Number of children: 2   Years of education: Not on file   Highest education level: Bachelor's degree (e.g., BA, AB, BS)  Occupational History   Occupation: retired   Tobacco Use   Smoking status: Never   Smokeless tobacco: Never  Vaping Use   Vaping status: Never Used  Substance and Sexual Activity   Alcohol use: No   Drug use: No   Sexual activity: Not on file  Other Topics Concern   Not on file  Social History Narrative   Right handed   Caffeine none   Lives tat home with spouse Aldona    Social Drivers of Health   Financial Resource Strain: Not on file  Food Insecurity: Not on file  Transportation Needs: Not on file  Physical Activity: Not on file  Stress: Not on file  Social Connections: Not on file  Intimate Partner Violence: Not on file    Family History  Problem Relation Age of Onset   Heart attack Mother    CAD Mother    Parkinson's disease Father    Diabetes Father    Cancer Sister    Diabetes Sister     Review of Systems:  As stated in the HPI and otherwise negative.   There were no vitals taken for this visit.  Physical Examination: General: Well developed, well nourished, NAD  HEENT: OP clear, mucus membranes moist  SKIN: warm, dry. No rashes. Neuro: No focal deficits  Musculoskeletal: Muscle strength 5/5 all ext  Psychiatric: Mood and affect normal  Neck: No JVD, no carotid bruits, no thyromegaly, no lymphadenopathy.  Lungs:Clear bilaterally, no wheezes, rhonci, crackles Cardiovascular: Regular rate and rhythm. *** Loud, harsh, late peaking systolic murmur.  Abdomen:Soft. Bowel sounds present. Non-tender.  Extremities: *** No lower extremity edema. Pulses are 2 + in the bilateral DP/PT.  EKG:  EKG {ACTION; IS/IS WNU:[id number]} ordered today. The ekg ordered today demonstrates  ***  Echo 04/11/24: 1. Left ventricular ejection fraction, by estimation, is 55 to 60%. The  left ventricle has normal function. The left ventricle has no regional  wall motion abnormalities. Left ventricular diastolic parameters are  consistent with Grade I diastolic  dysfunction (impaired relaxation).   2. Right ventricular systolic function is normal. The right ventricular  size is normal. Tricuspid regurgitation signal is inadequate for assessing  PA pressure.   3. Left atrial size was moderately dilated.   4. The mitral valve is degenerative. Moderate mitral valve regurgitation.  Mild mitral stenosis. The mean mitral valve gradient is 4.0 mmHg with  average heart rate of 79 bpm. Severe mitral annular calcification.   5. The aortic valve  is calcified. There is severe calcifcation of the  aortic valve. Aortic valve regurgitation is moderate to severe. Severe  aortic valve stenosis. Vmax 4.0 m/s, MG 34 mmHg, AVA 0.9 cm^2, DI 0.24   6. The inferior vena cava is normal in size with greater than 50%  respiratory variability, suggesting right atrial pressure of 3 mmHg.   FINDINGS   Left Ventricle: Left ventricular ejection fraction, by estimation, is 55  to 60%. The left ventricle has normal function. The left ventricle has no  regional wall motion abnormalities. The left ventricular internal cavity  size was normal in size. There is   no left ventricular hypertrophy. Left ventricular diastolic parameters  are consistent with Grade I diastolic dysfunction (impaired relaxation).   Right Ventricle: The right ventricular size is normal. No increase in  right ventricular wall thickness. Right ventricular systolic function is  normal. Tricuspid regurgitation signal is inadequate for assessing PA  pressure.   Left Atrium: Left atrial size was moderately dilated.   Right Atrium: Right atrial size was normal in size.   Pericardium: There is no evidence of pericardial effusion.   Mitral  Valve: The mitral valve is degenerative in appearance. Severe  mitral annular calcification. Moderate mitral valve regurgitation. Mild  mitral valve stenosis. MV peak gradient, 10.0 mmHg. The mean mitral valve  gradient is 4.0 mmHg with average heart   rate of 79 bpm.   Tricuspid Valve: The tricuspid valve is normal in structure. Tricuspid  valve regurgitation is trivial.   Aortic Valve: The aortic valve is calcified. There is severe calcifcation  of the aortic valve. Aortic valve regurgitation is moderate to severe.  Severe aortic stenosis is present. Aortic valve mean gradient measures  26.8 mmHg. Aortic valve peak gradient  measures 63.4 mmHg. Aortic valve area, by VTI measures 0.89 cm.   Pulmonic Valve: The pulmonic valve was not well visualized. Pulmonic valve  regurgitation is trivial.   Aorta: The aortic root and ascending aorta are structurally normal, with  no evidence of dilitation.   Venous: The inferior vena cava is normal in size with greater than 50%  respiratory variability, suggesting right atrial pressure of 3 mmHg.   IAS/Shunts: The atrial septum is grossly normal.     LEFT VENTRICLE  PLAX 2D  LVIDd:         5.25 cm   Diastology  LVIDs:         3.67 cm   LV e' medial:    5.97 cm/s  LV PW:         1.04 cm   LV E/e' medial:  12.6  LV IVS:        0.91 cm   LV e' lateral:   8.21 cm/s  LVOT diam:     1.90 cm   LV E/e' lateral: 9.2  LV SV:         63  LV SV Index:   35  LVOT Area:     2.84 cm     RIGHT VENTRICLE          IVC  RV Basal diam:  2.57 cm  IVC diam: 1.33 cm  RV Mid diam:    1.57 cm  TAPSE (M-mode): 2.5 cm   LEFT ATRIUM             Index        RIGHT ATRIUM           Index  LA diam:  4.00 cm 2.23 cm/m   RA Pressure: 3.00 mmHg  LA Vol (A2C):   94.8 ml 52.90 ml/m  RA Area:     12.80 cm  LA Vol (A4C):   60.8 ml 33.92 ml/m  RA Volume:   27.40 ml  15.29 ml/m  LA Biplane Vol: 78.8 ml 43.97 ml/m   AORTIC VALVE  AV Area (Vmax):    0.78 cm   AV Area (Vmean):   0.88 cm  AV Area (VTI):     0.89 cm  AV Vmax:           398.00 cm/s  AV Vmean:          240.000 cm/s  AV VTI:            0.704 m  AV Peak Grad:      63.4 mmHg  AV Mean Grad:      26.8 mmHg  LVOT Vmax:         110.00 cm/s  LVOT Vmean:        74.500 cm/s  LVOT VTI:          0.222 m  LVOT/AV VTI ratio: 0.32    AORTA  Ao Root diam: 3.40 cm  Ao Asc diam:  3.60 cm   MITRAL VALVE                TRICUSPID VALVE  MV Area VTI:  1.27 cm      Estimated RAP:  3.00 mmHg  MV Peak grad: 10.0 mmHg  MV Mean grad: 4.0 mmHg      SHUNTS  MV Vmax:      1.58 m/s      Systemic VTI:  0.22 m  MV Vmean:     85.2 cm/s     Systemic Diam: 1.90 cm  MV E velocity: 75.30 cm/s  MV A velocity: 146.00 cm/s  MV E/A ratio:  0.52   Cardiac CT 04/11/24: FINDINGS: Aortic Valve: Valve Morphology: Tricuspid with asymmetric, bulky calcification and reduced excursion the planimeter valve area is 1.19 Sq cm consistent with severe aortic stenosis   Annular and subannular calcification: Severe annular, crescentic, posterior calcification that extends into the LVOT and the intervalvular fibrosa.   Aortic Valve Calcium  Score: 6766   Presence of basal septal hypertrophy: Severe, but diastolic annulus is less that systolic annulus   Perimembranous septal diameter: 5 mm   Mitral Valve: Severe mitral annular calcification.   Aortic Annulus Measurements-31% Phase   Major annulus diameter: 31 mm   Minor annulus diameter: 26 mm   Annular perimeter: 90 mm   Annular area: 6.34 cm2   Aortic Measurements- 75% phase   Sinotubular Junction: 37 mm   Ascending Thoracic Aorta: 46 mm aneurysmal   Descending Thoracic Aorta: 27 mm   Aortic atherosclerosis.   Sinus of Valsalva Measurements:   Right coronary cusp width: 35 mm   Left coronary cusp width: 35 mm   Non coronary cusp width: 35 mm   Coronary Artery Height above Annulus:   Left Main: 14 mm   Left SoV height: 20 mm   Right  Coronary: 14 mm   Right SoV height: 19 mm   Optimum Fluoroscopic Angle for Delivery: LAO 5, CAU 3   Valves for structural team consideration:   29 mm Sapien recommended in the setting of CAD.   34 mm Evolut is feasible   Non TAVR Valve Findings:   Coronary Arteries: Normal coronary origin. The study was performed without use of  NTG and is insufficient for full plaque evaluation. The following assessment was made of the proximal segments:   Left main: The left main is a large caliber vessel with a normal take off from the left coronary cusp that bifurcates into a LAD & LCX. There is no plaque or stenosis.   LAD: Moderate calcified plaque stenosis on non-optimized study.   LCX: Moderate calcified plaque stenosis on non-optimized study.   RCA: Moderate calcified plaque stenosis on non-optimized study.   Consider heart catheterization as part of TAVR work up.   Systemic veins: Normal anatomy.   Main Pulmonary artery: Normal caliber   Pulmonary veins: Normal variant anatomy- right middle pulmonary vein.   Left atrial appendage: Patent.   Interatrial septum: No clear communications   Chamber dimensions: Right ventricular dilation, left atrial dimension, papillary muscle hypertrophy.   Pericardium: No significant calcifications.   Extra Cardiac Findings as per separate reporting.   Image quality: Excellent.   Noise artifact is: Limited.   IMPRESSION: 1. Severe Aortic stenosis. Measurements for potential interventions as above. Sizing recommendations unchanged from 2023 study.  Chest/Abdomen/Pelvis CTA 04/12/24: FINDINGS: Cardiovascular: Heart size is not significantly changed. Calcifications of the mitral annulus an aortic valve. Atherosclerotic changes are present in the left and right coronary territories. No pericardial effusion.   Sinus of Valsalva: 32 mm x 35 mm x 36 mm   Sinotubular junction: 35 mm x 38 mm   Tubular ascending thoracic aorta: 45 mm x 46  mm   Aortic arch: 34 mm x 29 mm   Proximal descending thoracic aorta: 34 mm x 34 mm   Descending thoracic aorta at the level of the aortic hiatus: 28 mm x 27 mm   Main pulmonary artery: Within normal limits.   Mediastinum/Nodes: Nothing significant.   Lungs/Pleura: Lungs are clear. No pleural effusion or pneumothorax.   Upper Abdomen: Reported separately.   Musculoskeletal: Degenerative changes in the imaged osseous structures.   Review of the MIP images confirms the above findings.   IMPRESSION: 1. Coronary artery atherosclerotic vascular disease. 2. Aneurysm of the tubular ascending thoracic aorta measuring 4.6 x 4.5 cm. 3. Mild diffuse atherosclerotic changes in the thoracic aorta without thoracic aortic dissection.  FINDINGS: VASCULAR   Aorta: Mild diffuse atherosclerotic changes are present in the abdominal aorta with mild ectasia. The infrarenal segment measures 2.6 cm in diameter. Minimal luminal diameter measures 13 mm.   Celiac: Patent.   SMA: Patent.   Renals: Mild atherosclerotic changes bilaterally, patent.   IMA: Likely occluded at the origin.   Inflow: Right common iliac artery is mildly dilated in the distal segment measuring 1.4 cm. Minimal luminal dimension measures 8 mm. The right external iliac artery is patent. The right internal iliac artery is patent. Minimal diameter of the right external iliac artery measures 7 mm.   The left common iliac artery is patent with minimal luminal diameter of 9 mm. The distal left common iliac artery is ectatic measuring 1.3 cm. Left internal and external iliac arteries are patent. Mild atherosclerotic changes in the left external iliac artery with minimal luminal diameter measuring 8 mm.   Proximal Outflow: The right common femoral artery is patent with mild atherosclerotic changes and minimal luminal diameter of 7 mm. Left common femoral artery is patent with mild atherosclerotic changes and minimal  luminal diameter of 7 mm.   Veins: No obvious venous abnormality within the limitations of this arterial phase study.   Review of the MIP images confirms the above  findings.   NON-VASCULAR   Lower chest: Reported separately.   Hepatobiliary: No intrahepatic or extrahepatic biliary ductal dilatation. The gallbladder is grossly unremarkable.   Pancreas: Unremarkable. No pancreatic ductal dilatation or surrounding inflammatory changes.   Spleen: Normal in size without focal abnormality.   Adrenals/Urinary Tract: The adrenal glands are grossly unremarkable. Cortical thinning is present in both kidneys. There is no overt hydronephrosis. Calcifications are present in the urinary bladder. Small simple cysts are suspected in the kidneys with the largest in the left kidney measuring 1.6 cm.   Stomach/Bowel: The stomach is nondilated. No dilated loops of bowel are appreciated. An enteric anastomosis is present in the distal left colon. Scattered colonic diverticulosis.   Lymphatic: No significant lymphadenopathy.   Reproductive: Markedly enlarged prostate with mass effect on the pelvic organs.   Other: No abdominal wall hernia or abnormality. No abdominopelvic ascites.   Musculoskeletal: Degenerative changes in the imaged osseous structures.   IMPRESSION: 1. Minimal luminal diameters detailed above.  Recent Labs: 02/23/2024: Hemoglobin 13.2; Platelets 143 03/28/2024: BUN 30; Creatinine, Ser 1.51; Potassium 4.3; Sodium 143    Wt Readings from Last 3 Encounters:  02/23/24 138 lb (62.6 kg)  10/13/23 140 lb (63.5 kg)  02/13/22 191 lb (86.6 kg)    Assessment and Plan:   1. Severe Aortic Valve Stenosis: He has severe, stage D1 aortic valve stenosis. NYHA class 2 symptoms. I have personally reviewed the echo images. The aortic valve is thickened and calcified with limited leaflet mobility. I think he would benefit from AVR. Given advanced age, he is not a good candidate for  conventional AVR by surgical approach. I think he may be a good candidate for TAVR.   I have reviewed the natural history of aortic stenosis with the patient and their family members  who are present today. We have discussed the limitations of medical therapy and the poor prognosis associated with symptomatic aortic stenosis. We have reviewed potential treatment options, including palliative medical therapy, conventional surgical aortic valve replacement, and transcatheter aortic valve replacement. We discussed treatment options in the context of the patient's specific comorbid medical conditions.   He would like to proceed with planning for TAVR. I will arrange coronary angiogram at [facility] ***. Risks and benefits of the cath procedure and the valve procedure are reviewed with the patient.   Following his cardiac cath, we will arrange a time for his TAVR procedure.     Labs/ tests ordered today include:  No orders of the defined types were placed in this encounter.  Disposition:   F/U will be arranged with the structural team  Signed, [name], MD, [facility] 04/25/2024 3:29 PM    [facility] [address] [place], [place], [place]  [address] Phone: [phone]; Fax: [phone]

## 2024-04-25 NOTE — Progress Notes (Unsigned)
 [facility] Consult Note  No chief complaint on file.  History of Present Illness: 83 yo male with history of HTN, HLD, CKD stage IIIb, thoracic aortic aneurysm and bicuspid aortic valve with aortic stenosis and aortic insufficiency who is here today as a new consult, referred by Dr. Lucas, for further discussion regarding his aortic valve disease and possible TAVR. His aortic stenosis had been followed by Dr. Dann. Echo in 2022 with moderate aortic stenosis, moderate aortic insufficiency and moderate mitral regurgitation. Ascending aorta dilated at 4.8 cm in 2022. Most recent CTA with 4.6 cm ascending aortic aneurysm. He has CKD, stage IIIb with most creatinine of 1.5 and GFR of 46. He has been followed in our CT surgery office by Dr. Lucas since January 2023. Echo September 2025 with LVEF=55-60%. Normal RV function. Moderate mitral regurgitation. Mild mitral stenosis. Severe aortic stenosis with mean gradient 34 mmHg, AVA 0.78 cm2, DI 0.24, SVI 35. Moderate to severe aortic valve insufficiency. Cardiac CT with annular area of 634 mm2 suitable for a 29 mm Edwards Sapien 3 valve. Chest/abdomen/pelvis CTA 04/12/24 with favorable anatomy for transfemoral approach to TAVR. He was seen on 04/19/24 by Dr. Lucas and reported dizziness, near syncope at home.   He tells me today that he is very active and has no dyspnea or chest pain. He denies LE edema. He has had dizziness but no syncope. *** He lives in *** He is retired Engineer, manufacturing ***  Primary Care Physician: Delice Charleston, MD (Inactive) Primary Cardiologist: Former Dann Referring: Dorise Lucas, MD  Past Medical History:  Diagnosis Date   Aortic valve disease    BPH (benign prostatic hyperplasia)    Diverticulosis of colon without hemorrhage 11/28/2012   ED (erectile dysfunction)    Elevated PSA    Gout    uses prednisone  1-2x/week   Hypertension    Iron deficiency anemia 11/28/2012   Mitral regurgitation    Mixed  hyperlipidemia    Ocular migraine    Paraesophageal hiatal hernia 11/28/2012   Radiculopathy    Thoracic aortic aneurysm (TAA)     Past Surgical History:  Procedure Laterality Date   CATARACT EXTRACTION Right 2012   CATARACT EXTRACTION Left Jan 2014   HERNIA REPAIR Right 1970s   open RIH repair   LAPAROSCOPIC PARTIAL COLECTOMY N/A 12/27/2012   Procedure: LAPAROSCOPIC  PARTIAL COLECTOMY RIGID PROCTOSCOPY ;  Surgeon: Elspeth KYM Schultze, MD;  Location: [facility];  Service: General;  Laterality: N/A;   LAPAROSCOPIC SIGMOID COLECTOMY  12/27/2012   TONSILLECTOMY AND ADENOIDECTOMY      Current Outpatient Medications  Medication Sig Dispense Refill   allopurinol  (ZYLOPRIM ) 300 MG tablet Take 300 mg by mouth 2 (two) times daily.      Ascorbic Acid  (VITAMIN C ) 1000 MG tablet Take 500 mg by mouth daily.      calcium  citrate (CALCITRATE - DOSED IN MG ELEMENTAL CALCIUM ) 950 MG tablet Take 1 tablet by mouth daily. 600mg  3 times a week     Cholecalciferol (VITAMIN D3) 10000 UNITS capsule Take 10,000 Units by mouth daily. 10000units 3x per week, 5000units 4x per week.     finasteride (PROSCAR) 5 MG tablet Take 5 mg by mouth daily.     magnesium oxide (MAG-OX) 400 MG tablet Take 400 mg by mouth daily.     meclizine  (ANTIVERT ) 25 MG tablet Take 1 tablet (25 mg total) by mouth 3 (three) times daily as needed for dizziness. 30 tablet 0   Multiple Vitamin (MULTIVITAMIN  WITH MINERALS) TABS Take 1 tablet by mouth daily.     OVER THE COUNTER MEDICATION 1 tablet daily. Trunature Prostate Health     psyllium (METAMUCIL) 58.6 % packet Take 1 packet by mouth daily.     PURE L-ARGININE HCL PO Take 1,800 mg by mouth.     vitamin B-12 (CYANOCOBALAMIN) 500 MCG tablet Take 500 mcg by mouth daily.     zinc  gluconate 50 MG tablet Take 30 mg by mouth daily.      No current facility-administered medications for this visit.    Allergies  Allergen Reactions   Crestor  [Rosuvastatin ] Other (See Comments)    Leg pain and  cramping   Exforge [Amlodipine Besylate-Valsartan]    Simvastatin     Social History   Socioeconomic History   Marital status: Married    Spouse name: Aldona   Number of children: 2   Years of education: Not on file   Highest education level: Bachelor's degree (e.g., BA, AB, BS)  Occupational History   Occupation: retired   Tobacco Use   Smoking status: Never   Smokeless tobacco: Never  Vaping Use   Vaping status: Never Used  Substance and Sexual Activity   Alcohol use: No   Drug use: No   Sexual activity: Not on file  Other Topics Concern   Not on file  Social History Narrative   Right handed   Caffeine none   Lives tat home with spouse Aldona    Social Drivers of Health   Financial Resource Strain: Not on file  Food Insecurity: Not on file  Transportation Needs: Not on file  Physical Activity: Not on file  Stress: Not on file  Social Connections: Not on file  Intimate Partner Violence: Not on file    Family History  Problem Relation Age of Onset   Heart attack Mother    CAD Mother    Parkinson's disease Father    Diabetes Father    Cancer Sister    Diabetes Sister     Review of Systems:  As stated in the HPI and otherwise negative.   There were no vitals taken for this visit.  Physical Examination: General: Well developed, well nourished, NAD  HEENT: OP clear, mucus membranes moist  SKIN: warm, dry. No rashes. Neuro: No focal deficits  Musculoskeletal: Muscle strength 5/5 all ext  Psychiatric: Mood and affect normal  Neck: No JVD, no carotid bruits, no thyromegaly, no lymphadenopathy.  Lungs:Clear bilaterally, no wheezes, rhonci, crackles Cardiovascular: Regular rate and rhythm. *** Loud, harsh, late peaking systolic murmur.  Abdomen:Soft. Bowel sounds present. Non-tender.  Extremities: *** No lower extremity edema. Pulses are 2 + in the bilateral DP/PT.  EKG:  EKG {ACTION; IS/IS WNU:[id number]} ordered today. The ekg ordered today demonstrates  ***  Echo 04/11/24: 1. Left ventricular ejection fraction, by estimation, is 55 to 60%. The  left ventricle has normal function. The left ventricle has no regional  wall motion abnormalities. Left ventricular diastolic parameters are  consistent with Grade I diastolic  dysfunction (impaired relaxation).   2. Right ventricular systolic function is normal. The right ventricular  size is normal. Tricuspid regurgitation signal is inadequate for assessing  PA pressure.   3. Left atrial size was moderately dilated.   4. The mitral valve is degenerative. Moderate mitral valve regurgitation.  Mild mitral stenosis. The mean mitral valve gradient is 4.0 mmHg with  average heart rate of 79 bpm. Severe mitral annular calcification.   5. The aortic valve  is calcified. There is severe calcifcation of the  aortic valve. Aortic valve regurgitation is moderate to severe. Severe  aortic valve stenosis. Vmax 4.0 m/s, MG 34 mmHg, AVA 0.9 cm^2, DI 0.24   6. The inferior vena cava is normal in size with greater than 50%  respiratory variability, suggesting right atrial pressure of 3 mmHg.   FINDINGS   Left Ventricle: Left ventricular ejection fraction, by estimation, is 55  to 60%. The left ventricle has normal function. The left ventricle has no  regional wall motion abnormalities. The left ventricular internal cavity  size was normal in size. There is   no left ventricular hypertrophy. Left ventricular diastolic parameters  are consistent with Grade I diastolic dysfunction (impaired relaxation).   Right Ventricle: The right ventricular size is normal. No increase in  right ventricular wall thickness. Right ventricular systolic function is  normal. Tricuspid regurgitation signal is inadequate for assessing PA  pressure.   Left Atrium: Left atrial size was moderately dilated.   Right Atrium: Right atrial size was normal in size.   Pericardium: There is no evidence of pericardial effusion.   Mitral  Valve: The mitral valve is degenerative in appearance. Severe  mitral annular calcification. Moderate mitral valve regurgitation. Mild  mitral valve stenosis. MV peak gradient, 10.0 mmHg. The mean mitral valve  gradient is 4.0 mmHg with average heart   rate of 79 bpm.   Tricuspid Valve: The tricuspid valve is normal in structure. Tricuspid  valve regurgitation is trivial.   Aortic Valve: The aortic valve is calcified. There is severe calcifcation  of the aortic valve. Aortic valve regurgitation is moderate to severe.  Severe aortic stenosis is present. Aortic valve mean gradient measures  26.8 mmHg. Aortic valve peak gradient  measures 63.4 mmHg. Aortic valve area, by VTI measures 0.89 cm.   Pulmonic Valve: The pulmonic valve was not well visualized. Pulmonic valve  regurgitation is trivial.   Aorta: The aortic root and ascending aorta are structurally normal, with  no evidence of dilitation.   Venous: The inferior vena cava is normal in size with greater than 50%  respiratory variability, suggesting right atrial pressure of 3 mmHg.   IAS/Shunts: The atrial septum is grossly normal.     LEFT VENTRICLE  PLAX 2D  LVIDd:         5.25 cm   Diastology  LVIDs:         3.67 cm   LV e' medial:    5.97 cm/s  LV PW:         1.04 cm   LV E/e' medial:  12.6  LV IVS:        0.91 cm   LV e' lateral:   8.21 cm/s  LVOT diam:     1.90 cm   LV E/e' lateral: 9.2  LV SV:         63  LV SV Index:   35  LVOT Area:     2.84 cm     RIGHT VENTRICLE          IVC  RV Basal diam:  2.57 cm  IVC diam: 1.33 cm  RV Mid diam:    1.57 cm  TAPSE (M-mode): 2.5 cm   LEFT ATRIUM             Index        RIGHT ATRIUM           Index  LA diam:  4.00 cm 2.23 cm/m   RA Pressure: 3.00 mmHg  LA Vol (A2C):   94.8 ml 52.90 ml/m  RA Area:     12.80 cm  LA Vol (A4C):   60.8 ml 33.92 ml/m  RA Volume:   27.40 ml  15.29 ml/m  LA Biplane Vol: 78.8 ml 43.97 ml/m   AORTIC VALVE  AV Area (Vmax):    0.78 cm   AV Area (Vmean):   0.88 cm  AV Area (VTI):     0.89 cm  AV Vmax:           398.00 cm/s  AV Vmean:          240.000 cm/s  AV VTI:            0.704 m  AV Peak Grad:      63.4 mmHg  AV Mean Grad:      26.8 mmHg  LVOT Vmax:         110.00 cm/s  LVOT Vmean:        74.500 cm/s  LVOT VTI:          0.222 m  LVOT/AV VTI ratio: 0.32    AORTA  Ao Root diam: 3.40 cm  Ao Asc diam:  3.60 cm   MITRAL VALVE                TRICUSPID VALVE  MV Area VTI:  1.27 cm      Estimated RAP:  3.00 mmHg  MV Peak grad: 10.0 mmHg  MV Mean grad: 4.0 mmHg      SHUNTS  MV Vmax:      1.58 m/s      Systemic VTI:  0.22 m  MV Vmean:     85.2 cm/s     Systemic Diam: 1.90 cm  MV E velocity: 75.30 cm/s  MV A velocity: 146.00 cm/s  MV E/A ratio:  0.52   Cardiac CT 04/11/24: FINDINGS: Aortic Valve: Valve Morphology: Tricuspid with asymmetric, bulky calcification and reduced excursion the planimeter valve area is 1.19 Sq cm consistent with severe aortic stenosis   Annular and subannular calcification: Severe annular, crescentic, posterior calcification that extends into the LVOT and the intervalvular fibrosa.   Aortic Valve Calcium  Score: 6766   Presence of basal septal hypertrophy: Severe, but diastolic annulus is less that systolic annulus   Perimembranous septal diameter: 5 mm   Mitral Valve: Severe mitral annular calcification.   Aortic Annulus Measurements-31% Phase   Major annulus diameter: 31 mm   Minor annulus diameter: 26 mm   Annular perimeter: 90 mm   Annular area: 6.34 cm2   Aortic Measurements- 75% phase   Sinotubular Junction: 37 mm   Ascending Thoracic Aorta: 46 mm aneurysmal   Descending Thoracic Aorta: 27 mm   Aortic atherosclerosis.   Sinus of Valsalva Measurements:   Right coronary cusp width: 35 mm   Left coronary cusp width: 35 mm   Non coronary cusp width: 35 mm   Coronary Artery Height above Annulus:   Left Main: 14 mm   Left SoV height: 20 mm   Right  Coronary: 14 mm   Right SoV height: 19 mm   Optimum Fluoroscopic Angle for Delivery: LAO 5, CAU 3   Valves for structural team consideration:   29 mm Sapien recommended in the setting of CAD.   34 mm Evolut is feasible   Non TAVR Valve Findings:   Coronary Arteries: Normal coronary origin. The study was performed without use of  NTG and is insufficient for full plaque evaluation. The following assessment was made of the proximal segments:   Left main: The left main is a large caliber vessel with a normal take off from the left coronary cusp that bifurcates into a LAD & LCX. There is no plaque or stenosis.   LAD: Moderate calcified plaque stenosis on non-optimized study.   LCX: Moderate calcified plaque stenosis on non-optimized study.   RCA: Moderate calcified plaque stenosis on non-optimized study.   Consider heart catheterization as part of TAVR work up.   Systemic veins: Normal anatomy.   Main Pulmonary artery: Normal caliber   Pulmonary veins: Normal variant anatomy- right middle pulmonary vein.   Left atrial appendage: Patent.   Interatrial septum: No clear communications   Chamber dimensions: Right ventricular dilation, left atrial dimension, papillary muscle hypertrophy.   Pericardium: No significant calcifications.   Extra Cardiac Findings as per separate reporting.   Image quality: Excellent.   Noise artifact is: Limited.   IMPRESSION: 1. Severe Aortic stenosis. Measurements for potential interventions as above. Sizing recommendations unchanged from 2023 study.  Chest/Abdomen/Pelvis CTA 04/12/24: FINDINGS: Cardiovascular: Heart size is not significantly changed. Calcifications of the mitral annulus an aortic valve. Atherosclerotic changes are present in the left and right coronary territories. No pericardial effusion.   Sinus of Valsalva: 32 mm x 35 mm x 36 mm   Sinotubular junction: 35 mm x 38 mm   Tubular ascending thoracic aorta: 45 mm x 46  mm   Aortic arch: 34 mm x 29 mm   Proximal descending thoracic aorta: 34 mm x 34 mm   Descending thoracic aorta at the level of the aortic hiatus: 28 mm x 27 mm   Main pulmonary artery: Within normal limits.   Mediastinum/Nodes: Nothing significant.   Lungs/Pleura: Lungs are clear. No pleural effusion or pneumothorax.   Upper Abdomen: Reported separately.   Musculoskeletal: Degenerative changes in the imaged osseous structures.   Review of the MIP images confirms the above findings.   IMPRESSION: 1. Coronary artery atherosclerotic vascular disease. 2. Aneurysm of the tubular ascending thoracic aorta measuring 4.6 x 4.5 cm. 3. Mild diffuse atherosclerotic changes in the thoracic aorta without thoracic aortic dissection.  FINDINGS: VASCULAR   Aorta: Mild diffuse atherosclerotic changes are present in the abdominal aorta with mild ectasia. The infrarenal segment measures 2.6 cm in diameter. Minimal luminal diameter measures 13 mm.   Celiac: Patent.   SMA: Patent.   Renals: Mild atherosclerotic changes bilaterally, patent.   IMA: Likely occluded at the origin.   Inflow: Right common iliac artery is mildly dilated in the distal segment measuring 1.4 cm. Minimal luminal dimension measures 8 mm. The right external iliac artery is patent. The right internal iliac artery is patent. Minimal diameter of the right external iliac artery measures 7 mm.   The left common iliac artery is patent with minimal luminal diameter of 9 mm. The distal left common iliac artery is ectatic measuring 1.3 cm. Left internal and external iliac arteries are patent. Mild atherosclerotic changes in the left external iliac artery with minimal luminal diameter measuring 8 mm.   Proximal Outflow: The right common femoral artery is patent with mild atherosclerotic changes and minimal luminal diameter of 7 mm. Left common femoral artery is patent with mild atherosclerotic changes and minimal  luminal diameter of 7 mm.   Veins: No obvious venous abnormality within the limitations of this arterial phase study.   Review of the MIP images confirms the above  findings.   NON-VASCULAR   Lower chest: Reported separately.   Hepatobiliary: No intrahepatic or extrahepatic biliary ductal dilatation. The gallbladder is grossly unremarkable.   Pancreas: Unremarkable. No pancreatic ductal dilatation or surrounding inflammatory changes.   Spleen: Normal in size without focal abnormality.   Adrenals/Urinary Tract: The adrenal glands are grossly unremarkable. Cortical thinning is present in both kidneys. There is no overt hydronephrosis. Calcifications are present in the urinary bladder. Small simple cysts are suspected in the kidneys with the largest in the left kidney measuring 1.6 cm.   Stomach/Bowel: The stomach is nondilated. No dilated loops of bowel are appreciated. An enteric anastomosis is present in the distal left colon. Scattered colonic diverticulosis.   Lymphatic: No significant lymphadenopathy.   Reproductive: Markedly enlarged prostate with mass effect on the pelvic organs.   Other: No abdominal wall hernia or abnormality. No abdominopelvic ascites.   Musculoskeletal: Degenerative changes in the imaged osseous structures.   IMPRESSION: 1. Minimal luminal diameters detailed above.  Recent Labs: 02/23/2024: Hemoglobin 13.2; Platelets 143 03/28/2024: BUN 30; Creatinine, Ser 1.51; Potassium 4.3; Sodium 143    Wt Readings from Last 3 Encounters:  02/23/24 138 lb (62.6 kg)  10/13/23 140 lb (63.5 kg)  02/13/22 191 lb (86.6 kg)    Assessment and Plan:   1. Severe Aortic Valve Stenosis: He has severe, stage D1 aortic valve stenosis. NYHA class 2 symptoms. I have personally reviewed the echo images. The aortic valve is thickened and calcified with limited leaflet mobility. I think he would benefit from AVR. Given advanced age, he is not a good candidate for  conventional AVR by surgical approach. I think he may be a good candidate for TAVR.   I have reviewed the natural history of aortic stenosis with the patient and their family members  who are present today. We have discussed the limitations of medical therapy and the poor prognosis associated with symptomatic aortic stenosis. We have reviewed potential treatment options, including palliative medical therapy, conventional surgical aortic valve replacement, and transcatheter aortic valve replacement. We discussed treatment options in the context of the patient's specific comorbid medical conditions.   He would like to proceed with planning for TAVR. I will arrange coronary angiogram at [facility] ***. Risks and benefits of the cath procedure and the valve procedure are reviewed with the patient.   Following his cardiac cath, we will arrange a time for his TAVR procedure.     Labs/ tests ordered today include:  No orders of the defined types were placed in this encounter.  Disposition:   F/U will be arranged with the structural team  Signed, [name], MD, [facility] 04/25/2024 3:29 PM    [facility] [address] [place], [place], [place]  [address] Phone: [phone]; Fax: [phone]

## 2024-04-26 ENCOUNTER — Ambulatory Visit: Attending: Cardiovascular Disease | Admitting: Cardiovascular Disease

## 2024-04-26 ENCOUNTER — Encounter: Payer: Self-pay | Admitting: Cardiovascular Disease

## 2024-04-26 VITALS — BP 144/64 | HR 83 | Ht 70.0 in | Wt 138.0 lb

## 2024-04-26 DIAGNOSIS — I350 Nonrheumatic aortic (valve) stenosis: Secondary | ICD-10-CM

## 2024-04-26 DIAGNOSIS — I77819 Aortic ectasia, unspecified site: Secondary | ICD-10-CM

## 2024-04-26 NOTE — Patient Instructions (Signed)
 Medication Instructions:  Your physician recommends that you continue on your current medications as directed. Please refer to the Current Medication list given to you today.  *If you need a refill on your cardiac medications before your next appointment, please call your pharmacy*  Lab Work: Have lab work (CBC and BMP) drawn today in the lab on the first floor If you have labs (blood work) drawn today and your tests are completely normal, you will receive your results only by: MyChart Message (if you have MyChart) OR A paper copy in the mail If you have any lab test that is abnormal or we need to change your treatment, we will call you to review the results.  Testing/Procedures: Your physician has requested that you have a cardiac catheterization. Cardiac catheterization is used to diagnose and/or treat various heart conditions. Doctors may recommend this procedure for a number of different reasons. The most common reason is to evaluate chest pain. Chest pain can be a symptom of coronary artery disease (CAD), and cardiac catheterization can show whether plaque is narrowing or blocking your heart's arteries. This procedure is also used to evaluate the valves, as well as measure the blood flow and oxygen levels in different parts of your heart. For further information please visit [email or web address] Please follow instruction sheet, as given. Scheduled for October 8  Follow-Up: At [facility], you and your health needs are our priority.  As part of our continuing mission to provide you with exceptional heart care, our providers are all part of one team.  This team includes your primary Cardiologist (physician) and Advanced Practice Providers or APPs (Physician Assistants and Nurse Practitioners) who all work together to provide you with the care you need, when you need it.  Your next appointment:   To be determined  Provider:   [name], MD    We recommend signing up  for the patient portal called MyChart.  Sign up information is provided on this After Visit Summary.  MyChart is used to connect with patients for Virtual Visits (Telemedicine).  Patients are able to view lab/test results, encounter notes, upcoming appointments, etc.  Non-urgent messages can be sent to your provider as well.   To learn more about what you can do with MyChart, go to [email or web address].   Other Instructions  [place] [facility] A DEPT OF [place]. [facility] AT [address] A DEPT OF THE [place]. [facility] [address] [place] [address] Dept: [phone] Loc: [phone]  Kaid Seeberger  04/26/2024  You are scheduled for a Cardiac Catheterization on Wednesday, October 8 with Dr. [name].  1. Please arrive at the [facility] (Main Entrance A) at [facility]: [address] [place], [place] [address] at 9:30 AM (This time is 2 hour(s) before your procedure to ensure your preparation).   Free valet parking service is available. You will check in at ADMITTING. The support person will be asked to wait in the waiting room.  It is OK to have someone drop you off and come back when you are ready to be discharged.    Special note: Every effort is made to have your procedure done on time. Please understand that emergencies sometimes delay scheduled procedures.  2. Diet: Nothing to eat after midnight.   3. Hydration: You need to be well hydrated before your procedure. On October 8, you may drink approved liquids (see below) until 2 hours before the procedure, with 16 oz  of water  as your last intake.   List of approved liquids water , clear juice, clear tea, black coffee, fruit juices, non-citric and without pulp, carbonated beverages, Gatorade, Kool -Aid, plain Jello-O and plain ice popsicles.  4. Labs: done in office on 10/1  5. Medication instructions in preparation for your procedure:   Contrast Allergy: No   On the  morning of your procedure, take your Aspirin 81 mg and any morning medicines NOT listed above.  You may use sips of water .  6. Plan to go home the same day, you will only stay overnight if medically necessary. 7. Bring a current list of your medications and current insurance cards. 8. You MUST have a responsible person to drive you home. 9. Someone MUST be with you the first 24 hours after you arrive home or your discharge will be delayed. 10. Please wear clothes that are easy to get on and off and wear slip-on shoes.  Thank you for allowing us  to care for you!   -- [facility]

## 2024-04-26 NOTE — Progress Notes (Signed)
 Pre Surgical Assessment: 5 M Walk Test  60M=16.45ft  5 Meter Walk Test- trial 1: 5.96 seconds 5 Meter Walk Test- trial 2: 5.46 seconds 5 Meter Walk Test- trial 3: 5.68 seconds 5 Meter Walk Test Average: 5.7 seconds

## 2024-04-27 ENCOUNTER — Ambulatory Visit: Payer: Self-pay | Admitting: Cardiovascular Disease

## 2024-04-27 LAB — BASIC METABOLIC PANEL WITH GFR
BUN/Creatinine Ratio: 23 (ref 10–24)
BUN: 30 mg/dL — ABNORMAL HIGH (ref 8–27)
CO2: 21 mmol/L (ref 20–29)
Calcium: 10.1 mg/dL (ref 8.6–10.2)
Chloride: 102 mmol/L (ref 96–106)
Creatinine, Ser: 1.3 mg/dL — ABNORMAL HIGH (ref 0.76–1.27)
Glucose: 88 mg/dL (ref 70–99)
Potassium: 4 mmol/L (ref 3.5–5.2)
Sodium: 144 mmol/L (ref 134–144)
eGFR: 55 mL/min/1.73 — ABNORMAL LOW (ref >59)

## 2024-04-27 LAB — CBC
Hematocrit: 41.9 % (ref 37.5–51.0)
Hemoglobin: 13.6 g/dL (ref 13.0–17.7)
MCH: 30.7 pg (ref 26.6–33.0)
MCHC: 32.5 g/dL (ref 31.5–35.7)
MCV: 95 fL (ref 79–97)
Platelets: 132 x10E3/uL — ABNORMAL LOW (ref 150–450)
RBC: 4.43 x10E6/uL (ref 4.14–5.80)
RDW: 14.1 % (ref 11.6–15.4)
WBC: 6.8 x10E3/uL (ref 3.4–10.8)

## 2024-05-01 ENCOUNTER — Telehealth: Payer: Self-pay | Admitting: *Deleted

## 2024-05-01 NOTE — Telephone Encounter (Signed)
 Cardiac Catheterization scheduled at [facility] for: Wednesday May 03, 2024 11:30 AM Arrival time [facility] Main Entrance A at: 9:30 AM  Diet: -Nothing to eat after midnight.  Hydration: -May drink clear liquids until 2 hours before the procedure.  Approved liquids: Water , clear tea, black coffee, fruit juices-non-citric and without pulp,Gatorade, plain Jello/popsicles.   -Please drink 16 oz of water  2 hours before procedure.  Medication instructions: -Usual morning medications can be taken including aspirin 81 mg.  Plan to go home the same day, you will only stay overnight if medically necessary.  You must have responsible adult to drive you home.  Someone must be with you the first 24 hours after you arrive home.  Reviewed with patient.

## 2024-05-03 ENCOUNTER — Encounter (HOSPITAL_COMMUNITY)
Admission: RE | Disposition: A | Discharge status: Home / Self Care | Payer: Self-pay | Source: Home / Self Care | Attending: Cardiovascular Disease

## 2024-05-03 ENCOUNTER — Ambulatory Visit (HOSPITAL_COMMUNITY)
Admission: RE | Admit: 2024-05-03 | Discharge: 2024-05-03 | Disposition: A | Discharge status: Home / Self Care | Attending: Cardiovascular Disease | Admitting: Cardiovascular Disease

## 2024-05-03 ENCOUNTER — Other Ambulatory Visit: Payer: Self-pay

## 2024-05-03 DIAGNOSIS — N1832 Chronic kidney disease, stage 3b: Secondary | ICD-10-CM | POA: Diagnosis not present

## 2024-05-03 DIAGNOSIS — I129 Hypertensive chronic kidney disease with stage 1 through stage 4 chronic kidney disease, or unspecified chronic kidney disease: Secondary | ICD-10-CM | POA: Diagnosis not present

## 2024-05-03 DIAGNOSIS — I2510 Atherosclerotic heart disease of native coronary artery without angina pectoris: Secondary | ICD-10-CM | POA: Diagnosis not present

## 2024-05-03 DIAGNOSIS — I350 Nonrheumatic aortic (valve) stenosis: Secondary | ICD-10-CM | POA: Insufficient documentation

## 2024-05-03 HISTORY — PX: CORONARY ANGIOGRAPHY: CATH118303

## 2024-05-03 SURGERY — CORONARY ANGIOGRAPHY (CATH LAB)
Anesthesia: LOCAL

## 2024-05-03 MED ORDER — FENTANYL CITRATE (PF) 100 MCG/2ML IJ SOLN
INTRAMUSCULAR | Status: DC | PRN
  Administered 2024-05-03 (×2): 25 ug via INTRAVENOUS

## 2024-05-03 MED ORDER — ACETAMINOPHEN 325 MG PO TABS: 650.0000 mg | ORAL_TABLET | ORAL | Status: DC | PRN

## 2024-05-03 MED ORDER — LIDOCAINE HCL (PF) 1 % IJ SOLN
INTRAMUSCULAR | Status: AC
  Filled 2024-05-03: qty 30

## 2024-05-03 MED ORDER — VERAPAMIL HCL 2.5 MG/ML IV SOLN
INTRAVENOUS | Status: DC | PRN
  Administered 2024-05-03: 10 mL via INTRA_ARTERIAL

## 2024-05-03 MED ORDER — SODIUM CHLORIDE 0.9 % WEIGHT BASED INFUSION: 1.0000 mL/kg/h | INTRAVENOUS | Status: DC

## 2024-05-03 MED ORDER — HYDRALAZINE HCL 20 MG/ML IJ SOLN: 10.0000 mg | INTRAMUSCULAR | Status: DC | PRN

## 2024-05-03 MED ORDER — FENTANYL CITRATE (PF) 100 MCG/2ML IJ SOLN
INTRAMUSCULAR | Status: AC
  Filled 2024-05-03: qty 2

## 2024-05-03 MED ORDER — HEPARIN (PORCINE) IN NACL 1000-0.9 UT/500ML-% IV SOLN
INTRAVENOUS | Status: DC | PRN
  Administered 2024-05-03 (×2): 500 mL

## 2024-05-03 MED ORDER — MIDAZOLAM HCL 2 MG/2ML IJ SOLN
INTRAMUSCULAR | Status: DC | PRN
  Administered 2024-05-03 (×2): 1 mg via INTRAVENOUS

## 2024-05-03 MED ORDER — ONDANSETRON HCL 4 MG/2ML IJ SOLN: 4.0000 mg | Freq: Four times a day (QID) | INTRAMUSCULAR | Status: DC | PRN

## 2024-05-03 MED ORDER — ASPIRIN 81 MG PO CHEW
81.0000 mg | CHEWABLE_TABLET | ORAL | Status: AC
  Administered 2024-05-03: 81 mg via ORAL
  Filled 2024-05-03: qty 1

## 2024-05-03 MED ORDER — LIDOCAINE HCL (PF) 1 % IJ SOLN
INTRAMUSCULAR | Status: DC | PRN
  Administered 2024-05-03: 2 mL via INTRADERMAL

## 2024-05-03 MED ORDER — HEPARIN SODIUM (PORCINE) 1000 UNIT/ML IJ SOLN
INTRAMUSCULAR | Status: DC | PRN
  Administered 2024-05-03: 3000 [IU] via INTRAVENOUS

## 2024-05-03 MED ORDER — HEPARIN SODIUM (PORCINE) 1000 UNIT/ML IJ SOLN
INTRAMUSCULAR | Status: AC
  Filled 2024-05-03: qty 10

## 2024-05-03 MED ORDER — SODIUM CHLORIDE 0.9% FLUSH: 3.0000 mL | INTRAVENOUS | Status: DC | PRN

## 2024-05-03 MED ORDER — SODIUM CHLORIDE 0.9 % IV SOLN: 250.0000 mL | INTRAVENOUS | Status: DC | PRN

## 2024-05-03 MED ORDER — MIDAZOLAM HCL 2 MG/2ML IJ SOLN
INTRAMUSCULAR | Status: AC
  Filled 2024-05-03: qty 2

## 2024-05-03 MED ORDER — IOHEXOL 350 MG/ML SOLN
INTRAVENOUS | Status: DC | PRN
  Administered 2024-05-03: 67 mL

## 2024-05-03 MED ORDER — FREE WATER: 500.0000 mL | Freq: Once | Status: DC

## 2024-05-03 MED ORDER — LABETALOL HCL 5 MG/ML IV SOLN: 10.0000 mg | INTRAVENOUS | Status: DC | PRN

## 2024-05-03 MED ORDER — SODIUM CHLORIDE 0.9 % WEIGHT BASED INFUSION
3.0000 mL/kg/h | INTRAVENOUS | Status: AC
  Administered 2024-05-03: 3 mL/kg/h via INTRAVENOUS

## 2024-05-03 MED ORDER — SODIUM CHLORIDE 0.9% FLUSH: 3.0000 mL | Freq: Two times a day (BID) | INTRAVENOUS | Status: DC

## 2024-05-03 MED ORDER — VERAPAMIL HCL 2.5 MG/ML IV SOLN
INTRAVENOUS | Status: AC
  Filled 2024-05-03: qty 2

## 2024-05-03 SURGICAL SUPPLY — 14 items
CATH INFINITI 5 FR AL2 (CATHETERS) IMPLANT
CATH INFINITI 5 FR JL3.5 (CATHETERS) IMPLANT
CATH INFINITI 5FR JL4 (CATHETERS) IMPLANT
CATH INFINITI JR4 5F (CATHETERS) IMPLANT
CATH VISTA GUIDE 6FR XBLD 3.5 (CATHETERS) IMPLANT
CLOSURE MYNX CONTROL 5F (Vascular Products) IMPLANT
DEVICE RAD COMP TR BAND LRG (VASCULAR PRODUCTS) IMPLANT
GLIDESHEATH SLEND SS 6F .021 (SHEATH) IMPLANT
GUIDEWIRE INQWIRE 1.5J.035X260 (WIRE) IMPLANT
PACK CARDIAC CATHETERIZATION (CUSTOM PROCEDURE TRAY) ×1 IMPLANT
SET ATX-X65L (MISCELLANEOUS) IMPLANT
SHEATH PINNACLE 5F 10CM (SHEATH) IMPLANT
SHEATH PROBE COVER 6X72 (BAG) IMPLANT
WIRE MICRO SET SILHO 5FR 7 (SHEATH) IMPLANT

## 2024-05-03 NOTE — Discharge Instructions (Signed)
Drink plenty of fluids for 48 hours and keep wrist elevated at heart level for 24 hours  Radial Site Care   This sheet gives you information about how to care for yourself after your procedure. Your health care provider may also give you more specific instructions. If you have problems or questions, contact your health care provider. What can I expect after the procedure? After the procedure, it is common to have: Bruising and tenderness at the catheter insertion area. Follow these instructions at home: Medicines Take over-the-counter and prescription medicines only as told by your health care provider. Insertion site care Follow instructions from your health care provider about how to take care of your insertion site. Make sure you: Wash your hands with soap and water before you change your bandage (dressing). If soap and water are not available, use hand sanitizer. Remove your dressing as told by your health care provider. In 24 hours Check your insertion site every day for signs of infection. Check for: Redness, swelling, or pain. Fluid or blood. Pus or a bad smell. Warmth. Do not take baths, swim, or use a hot tub until your health care provider approves. You may shower 24-48 hours after the procedure, or as directed by your health care provider. Remove the dressing and gently wash the site with plain soap and water. Pat the area dry with a clean towel. Do not rub the site. That could cause bleeding. Do not apply powder or lotion to the site. Activity   For 24 hours after the procedure, or as directed by your health care provider: Do not flex or bend the affected arm. Do not push or pull heavy objects with the affected arm. Do not drive yourself home from the hospital or clinic. You may drive 24 hours after the procedure unless your health care provider tells you not to. Do not operate machinery or power tools. Do not lift anything that is heavier than 10 lb (4.5 kg), or the  limit that you are told, until your health care provider says that it is safe.  For 4 days Ask your health care provider when it is okay to: Return to work or school. Resume usual physical activities or sports. Resume sexual activity. General instructions If the catheter site starts to bleed, raise your arm and put firm pressure on the site. If the bleeding does not stop, get help right away. This is a medical emergency. If you went home on the same day as your procedure, a responsible adult should be with you for the first 24 hours after you arrive home. Keep all follow-up visits as told by your health care provider. This is important. Contact a health care provider if: You have a fever. You have redness, swelling, or yellow drainage around your insertion site. Get help right away if: You have unusual pain at the radial site. The catheter insertion area swells very fast. The insertion area is bleeding, and the bleeding does not stop when you hold steady pressure on the area. Your arm or hand becomes pale, cool, tingly, or numb. These symptoms may represent a serious problem that is an emergency. Do not wait to see if the symptoms will go away. Get medical help right away. Call your local emergency services (911 in the [place] Do not drive yourself to the hospital. Summary After the procedure, it is common to have bruising and tenderness at the site. Follow instructions from your health care provider about how to take care of your  radial site wound. Check the wound every day for signs of infection. Do not lift anything that is heavier than 10 lb (4.5 kg), or the limit that you are told, until your health care provider says that it is safe. This information is not intended to replace advice given to you by your health care provider. Make sure you discuss any questions you have with your health care provider. Document Revised: 08/18/2017 Document Reviewed: 08/18/2017 Elsevier Patient Education   2020 [employer]   Femoral Site Care This sheet gives you information about how to care for yourself after your procedure. Your health care provider may also give you more specific instructions. If you have problems or questions, contact your health care provider. What can I expect after the procedure?  After the procedure, it is common to have: Bruising that usually fades within 1-2 weeks. Tenderness at the site. Follow these instructions at home: Wound care Follow instructions from your health care provider about how to take care of your insertion site. Make sure you: Wash your hands with soap and water before you change your bandage (dressing). If soap and water are not available, use hand sanitizer. Remove your dressing as told by your health care provider. 24 hours Do not take baths, swim, or use a hot tub until your health care provider approves. You may shower 24-48 hours after the procedure or as told by your health care provider. Gently wash the site with plain soap and water. Pat the area dry with a clean towel. Do not rub the site. This may cause bleeding. Do not apply powder or lotion to the site. Keep the site clean and dry. Check your femoral site every day for signs of infection. Check for: Redness, swelling, or pain. Fluid or blood. Warmth. Pus or a bad smell. Activity For the first 2-3 days after your procedure, or as long as directed: Avoid climbing stairs as much as possible. Do not squat. Do not lift anything that is heavier than 10 lb (4.5 kg), or the limit that you are told, until your health care provider says that it is safe. For 5 days Rest as directed. Avoid sitting for a long time without moving. Get up to take short walks every 1-2 hours. Do not drive for 24 hours if you were given a medicine to help you relax (sedative). General instructions Take over-the-counter and prescription medicines only as told by your health care provider. Keep all follow-up  visits as told by your health care provider. This is important. Contact a health care provider if you have: A fever or chills. You have redness, swelling, or pain around your insertion site. Get help right away if: The catheter insertion area swells very fast. You pass out. You suddenly start to sweat or your skin gets clammy. The catheter insertion area is bleeding, and the bleeding does not stop when you hold steady pressure on the area. The area near or just beyond the catheter insertion site becomes pale, cool, tingly, or numb. These symptoms may represent a serious problem that is an emergency. Do not wait to see if the symptoms will go away. Get medical help right away. Call your local emergency services (911 in the [place] Do not drive yourself to the hospital. Summary After the procedure, it is common to have bruising that usually fades within 1-2 weeks. Check your femoral site every day for signs of infection. Do not lift anything that is heavier than 10 lb (4.5 kg), or the  limit that you are told, until your health care provider says that it is safe. This information is not intended to replace advice given to you by your health care provider. Make sure you discuss any questions you have with your health care provider. Document Revised: 07/26/2017 Document Reviewed: 07/26/2017 Elsevier Patient Education  2020 [employer].

## 2024-05-03 NOTE — Interval H&P Note (Signed)
 History and Physical Interval Note:  05/03/2024 9:40 AM  Drew Adams  has presented today for surgery, with the diagnosis of AS.  The various methods of treatment have been discussed with the patient and family. After consideration of risks, benefits and other options for treatment, the patient has consented to  Procedure(s): CORONARY ANGIOGRAPHY (N/A) as a surgical intervention.  The patient's history has been reviewed, patient examined, no change in status, stable for surgery.  I have reviewed the patient's chart and labs.  Questions were answered to the patient's satisfaction.    Cath Lab Visit (complete for each Cath Lab visit)  Clinical Evaluation Leading to the Procedure:   ACS: No.  Non-ACS:    Anginal Classification: CCS II  Anti-ischemic medical therapy: No Therapy  Non-Invasive Test Results: No non-invasive testing performed  Prior CABG: No previous CABG        [name]

## 2024-05-04 ENCOUNTER — Encounter (HOSPITAL_COMMUNITY): Payer: Self-pay | Admitting: Cardiovascular Disease

## 2024-05-15 ENCOUNTER — Other Ambulatory Visit: Payer: Self-pay

## 2024-05-15 DIAGNOSIS — I350 Nonrheumatic aortic (valve) stenosis: Secondary | ICD-10-CM

## 2024-05-17 NOTE — Progress Notes (Addendum)
 [facility]   MULTIDISCIPLINARY HEART VALVE CLINIC                                     Cardiology Office Note:    Date:  05/18/2024   ID:  Drew Adams, DOB 1941-06-29, MRN [id number]  PCP:  Stacia Millman, PA  [facility] Cardiologist:  [name], MD  [facility] Structural heart: Lurena MARLA Red, MD [facility] Electrophysiologist:  None   Referring MD: Stacia Millman, PA   Follow up AS- discuss upcoming TAVR- 05/15/24  History of Present Illness:    Drew Adams is a 83 y.o. male with a hx of HLD, CKD stage IIIb, CAD, thoracic aortic aneurysm and bicuspid aortic valve with aortic stenosis and aortic insufficiency who presents to clinic for follow up.  His aortic stenosis had been followed by Dr. Dann. Echo in 2022 with moderate aortic stenosis, moderate aortic insufficiency and moderate mitral regurgitation. Ascending aorta dilated at 4.8 cm in 2022. Most recent CTA with 4.6 cm ascending aortic aneurysm. He has CKD, stage IIIb with most creatinine of 1.5 and GFR of 46. He has been followed in our CT surgery office by Dr. Lucas since January 2023. Echo September 2025 with LVEF=55-60%. Normal RV function. Moderate mitral regurgitation. Mild mitral stenosis. Severe aortic stenosis with mean gradient 34 mmHg, AVA 0.78 cm2, DI 0.24, SVI 35. Moderate to severe aortic valve insufficiency. Cardiac CT with annular area of 634 mm2 suitable for a 29 mm Edwards Sapien 3 valve. CTA 04/12/24 with favorable anatomy for transfemoral approach to TAVR. He was seen on 04/19/24 by Dr. Lucas and reported dizziness, near syncope at home. He was referred to Dr. Cash and underwent coronary angiography on 05/03/24 which showed moderate disease in the mid LAD and Diagonal branch, mild to moderate diffuse non-obstructive disease in large dominant RCA and no obstructive disease in the Circumflex. Medical therapy was recommended. He was set up for TAVR on  05/15/24.  Today the patient presents to clinic for follow up. Here alone. No CP or SOB. No LE edema, orthopnea or PND. He does get regular dizziness, mostly in the mornings but no syncope. No blood in stool or urine. No palpitations. He walks and runs almost 2 miles everyday. Wakes up sad every morning after his wife's passing last year.     Past Medical History:  Diagnosis Date   Aortic valve disease    BPH (benign prostatic hyperplasia)    Diverticulosis of colon without hemorrhage 11/28/2012   ED (erectile dysfunction)    Elevated PSA    Gout    uses prednisone  1-2x/week   Hypertension    Iron deficiency anemia 11/28/2012   Mitral regurgitation    Mixed hyperlipidemia    Ocular migraine    Paraesophageal hiatal hernia 11/28/2012   Radiculopathy    Thoracic aortic aneurysm (TAA)      Current Medications: Current Meds  Medication Sig   allopurinol  (ZYLOPRIM ) 300 MG tablet Take 300 mg by mouth daily.   Ascorbic Acid  (VITAMIN C ) 1000 MG tablet Take 1,000 mg by mouth daily.   calcium  citrate (CALCITRATE - DOSED IN MG ELEMENTAL CALCIUM ) 950 MG tablet Take 600 mg by mouth daily.   cyanocobalamin (VITAMIN B12) 1000 MCG tablet Take 1,000 mcg by mouth daily.   finasteride (PROSCAR) 5 MG tablet Take 5 mg by mouth daily.   hydrocortisone cream 1 % Apply  1 Application topically daily.   MAGNESIUM GLYCINATE PO Take 400 mg by mouth daily.   meclizine  (ANTIVERT ) 25 MG tablet Take 1 tablet (25 mg total) by mouth 3 (three) times daily as needed for dizziness.   Multiple Vitamin (MULTIVITAMIN WITH MINERALS) TABS Take 1 tablet by mouth daily. Mature   OVER THE COUNTER MEDICATION Take 1 tablet by mouth daily. prostacor   predniSONE  (DELTASONE ) 5 MG tablet Take 5 mg by mouth daily as needed (Gout).   psyllium (METAMUCIL) 58.6 % packet Take 1 packet by mouth daily. 10 ml   PURE L-ARGININE HCL PO Take 1,800 mg by mouth daily.   Zinc  Gluconate 15 MG TABS Take 50 mg by mouth daily. Plus copper 2  mg      ROS:   Please see the history of present illness.    All other systems reviewed and are negative.  EKGs       Risk Assessment/Calculations:            Physical Exam:    VS:  BP 130/60   Pulse 84   Ht 5' 10 (1.778 m)   Wt 138 lb (62.6 kg)   SpO2 98%   BMI 19.80 kg/m     Wt Readings from Last 3 Encounters:  05/18/24 138 lb (62.6 kg)  05/03/24 138 lb (62.6 kg)  04/26/24 138 lb (62.6 kg)     GEN: Well nourished, well developed in no acute distress NECK: No JVD CARDIAC: RRR, very soft murmur. No rubs, gallops RESPIRATORY:  Clear to auscultation without rales, wheezing or rhonchi  ABDOMEN: Soft, non-tender, non-distended EXTREMITIES:  No edema; No deformity.    ASSESSMENT:    1. Aortic stenosis, severe   2. Mitral valve disease   3. Thoracic aortic aneurysm without rupture, unspecified part   4. CKD stage 3a, GFR 45-59 ml/min (HCC)   5. Coronary artery disease due to calcified coronary lesion     PLAN:    In order of problems listed above:  Aortic valve disease with mixed AS/AI: -- Echo 03/2024 showed EF 55%, severe aortic stenosis with a mean grad 26.8 mmHg, Vmax of 4.0 m/s, AVA 0.89 cm2, DVI 0.32, SVI 35 and mod-severe AI. -- He is posted for TAVR-TF with a 29 S3UR with Dr. Wendel and Dr. Lucas on 05/15/24. Surgery instructions given to pt.   Mitral valve disease:   -- Echo 03/2024 showed severe MAC with moderate MR and mild MS (mean gradient 4 mm hg at HR of 79 bpm).  -- Continue to follow.   Thoracic aortic aneurysm: -- Known TAA with most recent CTA with 4.6 cm.  -- Plan to continue surveillance.   HLD: -- Intolerant of statins.  CKD stage IIIa: -- Creat baseline  1.26-1.51. -- Most recent creat 1.3 with GFR of 55.  CAD: -- Cath on 05/03/24 which showed moderate disease in the mid LAD and Diagonal branch, mild to moderate diffuse non-obstructive disease in large dominant RCA and no obstructive disease in the Circumflex.  -- Medical  therapy was recommended.  -- Statin intolerant.    Medication Adjustments/Labs and Tests Ordered: Current medicines are reviewed at length with the patient today.  Concerns regarding medicines are outlined above.  No orders of the defined types were placed in this encounter.  No orders of the defined types were placed in this encounter.   There are no Patient Instructions on file for this visit.   Signed, Lamarr Hummer, PA-C  05/18/2024 2:31 PM  [facility]

## 2024-05-18 ENCOUNTER — Ambulatory Visit: Attending: Physician Assistant | Admitting: Physician Assistant

## 2024-05-18 VITALS — BP 130/60 | HR 84 | Ht 70.0 in | Wt 138.0 lb

## 2024-05-18 DIAGNOSIS — I350 Nonrheumatic aortic (valve) stenosis: Secondary | ICD-10-CM | POA: Diagnosis not present

## 2024-05-18 DIAGNOSIS — I059 Rheumatic mitral valve disease, unspecified: Secondary | ICD-10-CM

## 2024-05-18 DIAGNOSIS — I2584 Coronary atherosclerosis due to calcified coronary lesion: Secondary | ICD-10-CM

## 2024-05-18 DIAGNOSIS — I712 Thoracic aortic aneurysm, without rupture, unspecified: Secondary | ICD-10-CM

## 2024-05-18 DIAGNOSIS — N1831 Chronic kidney disease, stage 3a: Secondary | ICD-10-CM | POA: Diagnosis not present

## 2024-05-18 DIAGNOSIS — I2510 Atherosclerotic heart disease of native coronary artery without angina pectoris: Secondary | ICD-10-CM | POA: Diagnosis not present

## 2024-05-26 ENCOUNTER — Encounter (HOSPITAL_COMMUNITY)
Admission: RE | Admit: 2024-05-26 | Discharge: 2024-05-26 | Disposition: A | Discharge status: Home / Self Care | Source: Ambulatory Visit | Attending: Internal Medicine | Admitting: Internal Medicine

## 2024-05-26 ENCOUNTER — Other Ambulatory Visit: Payer: Self-pay

## 2024-05-26 ENCOUNTER — Ambulatory Visit (HOSPITAL_COMMUNITY)
Admission: RE | Admit: 2024-05-26 | Discharge: 2024-05-26 | Disposition: A | Discharge status: Home / Self Care | Source: Ambulatory Visit | Attending: Internal Medicine | Admitting: Internal Medicine

## 2024-05-26 ENCOUNTER — Ambulatory Visit (HOSPITAL_COMMUNITY)

## 2024-05-26 DIAGNOSIS — Z01818 Encounter for other preprocedural examination: Secondary | ICD-10-CM

## 2024-05-26 DIAGNOSIS — I350 Nonrheumatic aortic (valve) stenosis: Secondary | ICD-10-CM

## 2024-05-26 LAB — URINALYSIS, ROUTINE W REFLEX MICROSCOPIC
Bilirubin Urine: NEGATIVE
Glucose, UA: NEGATIVE mg/dL
Hgb urine dipstick: NEGATIVE
Ketones, ur: 5 mg/dL — AB
Nitrite: NEGATIVE
Protein, ur: NEGATIVE mg/dL
Specific Gravity, Urine: 1.015 (ref 1.005–1.030)
pH: 5 (ref 5.0–8.0)

## 2024-05-26 LAB — COMPREHENSIVE METABOLIC PANEL WITH GFR
ALT: 18 U/L (ref 0–44)
AST: 21 U/L (ref 15–41)
Albumin: 4 g/dL (ref 3.5–5.0)
Alkaline Phosphatase: 60 U/L (ref 38–126)
Anion gap: 11 (ref 5–15)
BUN: 28 mg/dL — ABNORMAL HIGH (ref 8–23)
CO2: 26 mmol/L (ref 22–32)
Calcium: 9.3 mg/dL (ref 8.9–10.3)
Chloride: 104 mmol/L (ref 98–111)
Creatinine, Ser: 1.32 mg/dL — ABNORMAL HIGH (ref 0.61–1.24)
GFR, Estimated: 54 mL/min — ABNORMAL LOW (ref >60)
Glucose, Bld: 98 mg/dL (ref 70–99)
Potassium: 3.4 mmol/L — ABNORMAL LOW (ref 3.5–5.1)
Sodium: 141 mmol/L (ref 135–145)
Total Bilirubin: 1.4 mg/dL — ABNORMAL HIGH (ref 0.0–1.2)
Total Protein: 6.2 g/dL — ABNORMAL LOW (ref 6.5–8.1)

## 2024-05-26 LAB — PROTIME-INR
INR: 1 (ref 0.8–1.2)
Prothrombin Time: 13.5 s (ref 11.4–15.2)

## 2024-05-26 LAB — CBC
HCT: 40 % (ref 39.0–52.0)
Hemoglobin: 12.6 g/dL — ABNORMAL LOW (ref 13.0–17.0)
MCH: 30.1 pg (ref 26.0–34.0)
MCHC: 31.5 g/dL (ref 30.0–36.0)
MCV: 95.7 fL (ref 80.0–100.0)
Platelets: 128 K/uL — ABNORMAL LOW (ref 150–400)
RBC: 4.18 MIL/uL — ABNORMAL LOW (ref 4.22–5.81)
RDW: 15.7 % — ABNORMAL HIGH (ref 11.5–15.5)
WBC: 6.3 K/uL (ref 4.0–10.5)
nRBC: 0 % (ref 0.0–0.2)

## 2024-05-26 LAB — TYPE AND SCREEN
ABO/RH(D): A POS
Antibody Screen: NEGATIVE

## 2024-05-26 LAB — SURGICAL PCR SCREEN
MRSA, PCR: NEGATIVE
Staphylococcus aureus: NEGATIVE

## 2024-05-26 NOTE — Progress Notes (Signed)
 All consents signed by patient at PAT lab appointment. Pt was sent home with printed copy of surgical instructions and CHG soap/CHG soap instructions. All instructions reviewed with patient and questions answered.  Patients chart send to anesthesia for review. Pt denies any respiratory illness/infection in the last two months.

## 2024-05-26 NOTE — Progress Notes (Deleted)
 Cardiology Office Note:    Date:  05/26/2024   ID:  Drew Adams, DOB [birth year], MRN [id number]  PCP:  Stacia Millman, PA  Cardiologist:  [name], MD Structural Heart:  Lurena MARLA Red, MD{ Click to update primary MD,subspecialty MD or APP then REFRESH:1}    Referring MD: Stacia Millman, PA   Chief Complaint: hospital follow-up after recent TAVR  History of Present Illness:    Drew Adams is a 83 y.o. male with a history of moderate non-obstructive CAD noted on cardiac catheterization in 04/2024, severe aortic stenosis/ moderate to severe aortic insufficiency s/p TAVR on 05/30/2024, moderate mitral regurgitation/ mild mitral stenosis, ascending thoracic aortic aneurysm,  hypertension, hyperlipidemia, iron deficiency anemia,  BPH, and migraines who is followed by Dr. Cash and presents today for hospital follow-up after recent TAVR.   Patient has been followed by Cardiology since 2021 for moderate to severe aortic stenosis. He was seen by Dr. Lucas in 09/2023 and Echo at that visit showed  LVEF of 50-55% with severe aortic stenosis/ moderate aortic insufficiency,  and mild mitral regurgitation. He was asymptomatic so plan was for close monitoring. Echo in 03/2024 showed LVEF of 55-60% with no regional wall motion abnormalities and grade 1 diastolic dysfunction, normal RV function, severe aortic stenosis/ moderate to severe aortic insufficiency (mean gradient 34 mmHg, AVA 0.9 cm2, DI 0.24), and moderate regurgitation/ mild mitral stenosis (mean gradient 4.0 mmHg with average heart rate of 79 bpm). Coronary CTA at that time showed a tricuspid aortic valve with asymmetric, bulky calcification and severe aortic stenosis and chest CTA showed an anuerysm of the tubular ascending thoracic aorta measuring 4.6 x 4.5 cm. TAVR was felt to be the best option so he was referred to Dr. Cash in 04/2024 at which time he reported some dizziness when first standing or moving his head  but he denied any dyspnea or chest pain. LHC on 05/03/2024 moderate non-obstructive disease in the mid LAD and Diag branch and mild to moderate diffuse disease in the a large dominant RCA. Medical therapy was recommended. He was last seen by Izetta Hummer, PA-C, on 05/18/2024 and TAVR was arranged for 05/30/2024.   He underwent successful TAVR with a 29 mm Edwards Sapien 3 Ultra Resilia THV via the TF approach on 05/30/24. POD1 Echo showed mild perivalvular regurgitation but no aortic stenosis (mean gradient 10.0 mmHg). Initial post-op EKG showed a new LBBB but this resolved on repeat tracings and he had no AV block noted on telemetry. He did received one dose of IV Lasix due to LVEDP of 21 mmHg during procedure but developed AKI with this. He was started on Aspirin.   Patient presents today for follow-up. ***  Severe Aortic Stenosis/ Moderate to Severe Aortic Insufficiency s/p TAVR S/p successful TAVR on 05/30/2024.  - Recovering well. *** - Continue Aspirin 81mg  daily.  - Will need SBE prophylaxis prior to any dental cleaning/ procedures in the future. Will provide prescription of Amoxicillin 2,000mg  for him to take 30-60 minutes prior to dental work.  - Post-op Echo scheduled for ***.  Non-Obstruction CAD Noted on recent cardiac catheterization in 04/2024 prior to TAVR. - No chest pain. *** - Continue Asprin 81mg  daily. - Intolerant to statins in the past.   Ascending Thoracic Aortic Aneurysm  CTA in 03/2024 showed an aneurysm of the tubular ascending thoracic aorta measuring 4.6 x 4.5cm.  - Beta blocker *** - Continue surveillance imaging.   Mitral Valve Disease Echo in 9/205 showed severe MAC  with moderate MR and mild MS (mean gradient 4 mm hg at HR of 79 bpm).  - Will continue to routine surveillance imaging per guidelines.   Hypertension BP ***  Hyperlipidemia Lipid panel in *** - Intolerant to statins in the past. Zetia? ***  CKD Stage IIIa Baseline creatinine around 1.3 to  1.5. Most recent creatinine 1.96 on recent discharge.  - Will repeat BMET today. ***  Thrombocytopenia Platelets dropped from 128 to 85 after recent TAVR. This was felt to likely be reactive.  - Will repeat CBC today. ***   EKGs/Labs/Other Studies Reviewed:    The following studies were reviewed today:  Echocardiogram 04/11/2024: Impressions: 1. Left ventricular ejection fraction, by estimation, is 55 to 60%. The  left ventricle has normal function. The left ventricle has no regional  wall motion abnormalities. Left ventricular diastolic parameters are  consistent with Grade I diastolic  dysfunction (impaired relaxation).   2. Right ventricular systolic function is normal. The right ventricular  size is normal. Tricuspid regurgitation signal is inadequate for assessing  PA pressure.   3. Left atrial size was moderately dilated.   4. The mitral valve is degenerative. Moderate mitral valve regurgitation.  Mild mitral stenosis. The mean mitral valve gradient is 4.0 mmHg with  average heart rate of 79 bpm. Severe mitral annular calcification.   5. The aortic valve is calcified. There is severe calcifcation of the  aortic valve. Aortic valve regurgitation is moderate to severe. Severe  aortic valve stenosis. Vmax 4.0 m/s, MG 34 mmHg, AVA 0.9 cm^2, DI 0.24   6. The inferior vena cava is normal in size with greater than 50%  respiratory variability, suggesting right atrial pressure of 3 mmHg.  _______________  Chest CTA 04/11/2024: Impressions: 1. Coronary artery atherosclerotic vascular disease. 2. Aneurysm of the tubular ascending thoracic aorta measuring 4.6 x 4.5 cm. 3. Mild diffuse atherosclerotic changes in the thoracic aorta without thoracic aortic dissection. _______________  Cardiac Catheterization 05/03/2024:   Prox RCA to Mid RCA lesion is 30% stenosed.   Dist RCA lesion is 30% stenosed.   RPDA lesion is 30% stenosed.   Mid Cx to Dist Cx lesion is 30% stenosed.   Mid  LAD lesion is 65% stenosed.   1st Diag lesion is 60% stenosed.   Moderate disease in the mid LAD and Diagonal branch No obstructive disease in the Circumflex Large dominant RCA with mild to moderate diffuse non-obstructive disease   Recommendations: Continue workup for TAVR. Medical management of CAD.  Diagnostic Dominance: Right      EKG:  EKG ordered today. EKG personally reviewed and demonstrates ***.  Recent Labs: 05/26/2024: ALT 18; BUN 28; Creatinine, Ser 1.32; Hemoglobin 12.6; Platelets 128; Potassium 3.4; Sodium 141  Recent Lipid Panel No results found for: CHOL, TRIG, HDL, CHOLHDL, VLDL, LDLCALC, LDLDIRECT  Physical Exam:    Vital Signs: There were no vitals taken for this visit.    Wt Readings from Last 3 Encounters:  05/18/24 138 lb (62.6 kg)  05/03/24 138 lb (62.6 kg)  04/26/24 138 lb (62.6 kg)     General: 83 y.o. male in no acute distress. HEENT: Normocephalic and atraumatic. Sclera clear.  Neck: Supple. No carotid bruits. No JVD. Heart: *** RRR. Distinct S1 and S2. No murmurs, gallops, or rubs.  Lungs: No increased work of breathing. Clear to ausculation bilaterally. No wheezes, rhonchi, or rales.  Abdomen: Soft, non-distended, and non-tender to palpation.  Extremities: No lower extremity edema.  Radial and distal pedal pulses  2+ and equal bilaterally. Skin: Warm and dry. Neuro: No focal deficits. Psych: Normal affect. Responds appropriately.   Assessment:    No diagnosis found.  Plan:     Disposition: Follow up in ***   Signed, Aline FORBES Door, PA-C  05/26/2024 4:38 PM    [facility]

## 2024-05-29 ENCOUNTER — Telehealth: Payer: Self-pay | Admitting: Internal Medicine

## 2024-05-29 MED ORDER — HEPARIN 30,000 UNITS/1000 ML (OHS) CELLSAVER SOLUTION
Status: DC
  Filled 2024-05-29 (×2): qty 1000

## 2024-05-29 MED ORDER — DEXMEDETOMIDINE HCL IN NACL 400 MCG/100ML IV SOLN
0.1000 ug/kg/h | INTRAVENOUS | Status: AC
  Administered 2024-05-30: 1 ug/kg/h via INTRAVENOUS
  Filled 2024-05-29: qty 100

## 2024-05-29 MED ORDER — POTASSIUM CHLORIDE 2 MEQ/ML IV SOLN
80.0000 meq | INTRAVENOUS | Status: DC
  Filled 2024-05-29 (×2): qty 40

## 2024-05-29 MED ORDER — MAGNESIUM SULFATE 50 % IJ SOLN
40.0000 meq | INTRAMUSCULAR | Status: DC
Start: 2024-05-30 — End: 2024-05-31
  Filled 2024-05-29: qty 9.85

## 2024-05-29 MED ORDER — NOREPINEPHRINE 4 MG/250ML-% IV SOLN
0.0000 ug/min | INTRAVENOUS | Status: AC
  Administered 2024-05-30: 2 ug/min via INTRAVENOUS
  Filled 2024-05-29: qty 250

## 2024-05-29 MED ORDER — CEFAZOLIN SODIUM-DEXTROSE 2-4 GM/100ML-% IV SOLN
2.0000 g | INTRAVENOUS | Status: AC
  Administered 2024-05-30: 2 g via INTRAVENOUS
  Filled 2024-05-29: qty 100

## 2024-05-29 NOTE — Telephone Encounter (Signed)
 I spoke with the patient and answered all questions in preparation for TAVR tomorrow.

## 2024-05-29 NOTE — H&P (Signed)
 [address] [place] [address]             [phone]      CARDIOTHORACIC SURGERY ADMISSION HISTORY AND PHYSICAL   PCP is Delice Charleston, MD (Inactive) Referring Provider is Ozell Fell, MD Primary Cardiologist is Candyce Reek, MD (retired)   Reason for admission:  Severe aortic stenosis   HPI:   The patient is an 83 year old gentleman with a history of hypertension, hyperlipidemia, gout, stage IIIb chronic kidney disease, bicuspid aortic valve stenosis and insufficiency as well as a 4.6 cm fusiform ascending aortic aneurysm who was referred for consideration of TAVR.  He had an echo on 07/04/2021 showing a mean gradient of 33 mmHg and a peak gradient of 58 mmHg with a valve area of 1.0 cm by VTI and a dimensionless index of 0.28 consistent with moderate to severe aortic stenosis.  He also had moderate aortic insufficiency and moderate mitral regurgitation.  The echo also showed a 4.8 cm fusiform ascending aortic aneurysm.  Left ventricular ejection fraction was 60 to 65%.  He was feeling well when I saw him for initial consultation on 08/01/2021 without any symptoms at all and was quite active.  After discussion with the patient and his wife we decided to continue following this with a repeat CTA of the chest and echo in 6 months.  I last saw him on 10/13/2023 and his wife had subsequently become ill and died since our last visit in 07/14/23so he had not been back for follow-up since then.  When I saw him in March the mean gradient across the valve was 38 mmHg with a valve area of 0.8 cm and dimensionless index of 0.22.  There was moderate aortic insufficiency with normal left ventricular ejection fraction and stable LV diastolic dimension of 5.45 cm.  He remained fairly active and asymptomatic.  He wanted to continue following this and we thought that was reasonable.  His most recent echocardiogram on 04/11/2024 showed a severely calcified aortic valve with a  mean pressure gradient of 34 mmHg, valve area of 0.9 cm, and dimensionless index of 0.24.  There was mild mitral stenosis with a mean gradient of 4 mmHg and moderate mitral regurgitation.   He continues to deny any chest pain or shortness of breath.  He feels like his stamina is good.  He did have a recent emergency room visit on 02/23/2024 for dizziness that was present when he woke up in the morning and worsen when he tried to stand up and move his head.  He received some meclizine  in triage and this seemed to improve his dizziness.  His blood pressure in the emergency room was 151/62 with a pulse of 61.  He says that since then he has had occasional episodes of dizziness with standing up from laying down or sitting.  He denies any peripheral edema.  He denies any orthopnea.  He said that he has been strictly maintaining an Atkins diet and has intentionally lost about 40 pounds over the last 3 to 4 months.  He continues to walk 2 miles daily.       Past Medical History:  Diagnosis Date   Aortic valve disease     BPH (benign prostatic hyperplasia)     Diverticulosis of colon without hemorrhage 11/28/2012   ED (erectile dysfunction)     Elevated PSA     Gout      uses prednisone  1-2x/week  Hypertension     Iron deficiency anemia 11/28/2012   Mixed hyperlipidemia     Ocular migraine     Paraesophageal hiatal hernia 11/28/2012   Radiculopathy     Thoracic aortic aneurysm (TAA)                 Past Surgical History:  Procedure Laterality Date   CATARACT EXTRACTION Right 2012   CATARACT EXTRACTION Left Jan 2014   HERNIA REPAIR Right 1970s    open RIH repair   LAPAROSCOPIC PARTIAL COLECTOMY N/A 12/27/2012    Procedure: LAPAROSCOPIC  PARTIAL COLECTOMY RIGID PROCTOSCOPY ;  Surgeon: Elspeth KYM Schultze, MD;  Location: [facility];  Service: General;  Laterality: N/A;   LAPAROSCOPIC SIGMOID COLECTOMY   12/27/2012   TONSILLECTOMY AND ADENOIDECTOMY                   Family History  Problem  Relation Age of Onset   Heart attack Mother     CAD Mother     Parkinson's disease Father     Diabetes Father     Cancer Sister     Diabetes Sister            Social History         Socioeconomic History   Marital status: Married      Spouse name: Aldona   Number of children: 2   Years of education: Not on file   Highest education level: Bachelor's degree (e.g., BA, AB, BS)  Occupational History   Occupation: retired   Tobacco Use   Smoking status: Never   Smokeless tobacco: Never  Vaping Use   Vaping status: Never Used  Substance and Sexual Activity   Alcohol use: No   Drug use: No   Sexual activity: Not on file  Other Topics Concern   Not on file  Social History Narrative    Right handed    Caffeine none    Lives tat home with spouse Aldona     Social Drivers of Health    Financial Resource Strain: Not on file  Food Insecurity: Not on file  Transportation Needs: Not on file  Physical Activity: Not on file  Stress: Not on file  Social Connections: Not on file  Intimate Partner Violence: Not on file             Prior to Admission medications   Medication Sig Start Date End Date Taking? Authorizing Provider  allopurinol  (ZYLOPRIM ) 300 MG tablet Take 300 mg by mouth 2 (two) times daily.  11/15/12   Yes [provider]  Ascorbic Acid  (VITAMIN C ) 1000 MG tablet Take 500 mg by mouth daily.      Yes [provider]  calcium  citrate (CALCITRATE - DOSED IN MG ELEMENTAL CALCIUM ) 950 MG tablet Take 1 tablet by mouth daily. 600mg  3 times a week     Yes [provider]  Cholecalciferol (VITAMIN D3) 10000 UNITS capsule Take 10,000 Units by mouth daily. 10000units 3x per week, 5000units 4x per week.     Yes [provider]  finasteride (PROSCAR) 5 MG tablet Take 5 mg by mouth daily.     Yes [provider]  magnesium oxide (MAG-OX) 400 MG tablet Take 400 mg by mouth daily.     Yes [provider]  meclizine  (ANTIVERT ) 25  MG tablet Take 1 tablet (25 mg total) by mouth 3 (three) times daily as needed for dizziness. 02/23/24   Yes Mannie Fairy DASEN, DO  Multiple Vitamin (MULTIVITAMIN WITH MINERALS) TABS Take 1 tablet by mouth daily.     Yes [provider]  OVER THE COUNTER MEDICATION 1 tablet daily. Trunature Prostate Health     Yes [provider]  psyllium (METAMUCIL) 58.6 % packet Take 1 packet by mouth daily.     Yes [provider]  PURE L-ARGININE HCL PO Take 1,800 mg by mouth.     Yes [provider]  vitamin B-12 (CYANOCOBALAMIN) 500 MCG tablet Take 500 mcg by mouth daily.     Yes [provider]  zinc  gluconate 50 MG tablet Take 30 mg by mouth daily.      Yes [provider]            Current Outpatient Medications  Medication Sig Dispense Refill   allopurinol  (ZYLOPRIM ) 300 MG tablet Take 300 mg by mouth 2 (two) times daily.        Ascorbic Acid  (VITAMIN C ) 1000 MG tablet Take 500 mg by mouth daily.        calcium  citrate (CALCITRATE - DOSED IN MG ELEMENTAL CALCIUM ) 950 MG tablet Take 1 tablet by mouth daily. 600mg  3 times a week       Cholecalciferol (VITAMIN D3) 10000 UNITS capsule Take 10,000 Units by mouth daily. 10000units 3x per week, 5000units 4x per week.       finasteride (PROSCAR) 5 MG tablet Take 5 mg by mouth daily.       magnesium oxide (MAG-OX) 400 MG tablet Take 400 mg by mouth daily.       meclizine  (ANTIVERT ) 25 MG tablet Take 1 tablet (25 mg total) by mouth 3 (three) times daily as needed for dizziness. 30 tablet 0   Multiple Vitamin (MULTIVITAMIN WITH MINERALS) TABS Take 1 tablet by mouth daily.       OVER THE COUNTER MEDICATION 1 tablet daily. Trunature Prostate Health       psyllium (METAMUCIL) 58.6 % packet Take 1 packet by mouth daily.       PURE L-ARGININE HCL PO Take 1,800 mg by mouth.       vitamin B-12 (CYANOCOBALAMIN) 500 MCG tablet Take 500 mcg by mouth daily.       zinc  gluconate 50 MG tablet Take 30 mg by mouth daily.            No current facility-administered medications for this visit.        Allergies       Allergies  Allergen Reactions   Crestor  [Rosuvastatin ] Other (See Comments)      Leg pain and cramping   Exforge [Amlodipine Besylate-Valsartan]     Simvastatin              Review of Systems:               General:                      normal appetite, normal energy, no weight gain, + intentional weight loss, no fever             Cardiac:                       no chest pain with exertion, no chest pain at rest, no SOB with exertion, no resting SOB, no PND, no orthopnea, no palpitations, no arrhythmia, no atrial fibrillation, no LE edema, + dizzy spells, no syncope  Respiratory:                 no shortness of breath, no home oxygen, no productive cough, no dry cough, no bronchitis, no wheezing, no hemoptysis, no asthma, no pain with inspiration or cough, no sleep apnea, no CPAP at night             GI:                               no difficulty swallowing, no reflux, no frequent heartburn, no hiatal hernia, no abdominal pain, no constipation, no diarrhea, no hematochezia, no hematemesis, no melena             GU:                              no dysuria,  no frequency, no urinary tract infection, no hematuria, + enlarged prostate, no kidney stones, + chronic kidney disease             Vascular:                     no pain suggestive of claudication, no pain in feet, no leg cramps, no varicose veins, no DVT, no non-healing foot ulcer             Neuro:                         no stroke, no TIA's, no seizures, no headaches, no temporary blindness one eye,  no slurred speech, no peripheral neuropathy, no chronic pain, no instability of gait, no memory/cognitive dysfunction             Musculoskeletal:         no arthritis, no joint swelling, no myalgias, no difficulty walking, + normal mobility              Skin:                            no rash, no itching, no skin infections, no  pressure sores or ulcerations             Psych:                         no anxiety, no depression, no nervousness, no unusual recent stress             Eyes:                           no blurry vision, no floaters, no recent vision changes, no glasses or contacts             ENT:                            no hearing loss, no loose or painful teeth, no dentures, last saw dentist 02/2024             Hematologic:               no easy bruising, no abnormal bleeding, no clotting disorder, no frequent epistaxis             Endocrine:  no diabetes, does not check CBG's at home                            Physical Exam:               BP (!) 170/62   Pulse 82   Resp 20   Ht 5' 10.5 (1.791 m)   SpO2 96% Comment: RA  BMI 19.52 kg/m              General:                      Elderly,  well-appearing             HEENT:                       Unremarkable, NCAT, PERLA, EOMI             Neck:                           no JVD, no bruits, no adenopathy              Chest:                          clear to auscultation, symmetrical breath sounds, no wheezes, no rhonchi              CV:                              RRR, 3/6 systolic murmur RSB, no diastolic murmur             Abdomen:                    soft, non-tender, no masses              Extremities:                 warm, well-perfused, pulses palpable, trace bilateral lower extremity edema             Rectal/GU                   Deferred             Neuro:                         Grossly non-focal and symmetrical throughout             Skin:                            Clean and dry, no rashes, no breakdown   Diagnostic Tests:   ECHOCARDIOGRAM REPORT       Patient Name:   Drew Adams  Date of Exam: 04/11/2024  Medical Rec #:  [id number]     Height:       70.5 in  Accession #:    [id number]    Weight:       138.0 lb  Date of Birth:  1940/08/20     BSA:          1.792 m  Patient Age:    83 years      BP:  174/71 mmHg   Patient Gender: M             HR:           71 bpm.  Exam Location:  [address]   Procedure: 2D Echo, Cardiac Doppler and Color Doppler (Both Spectral and  Color            Flow Doppler were utilized during procedure).   Indications:    Nonrheumatic Aortic valve stenosis I35.0    History:        Patient has prior history of Echocardiogram examinations.  Risk                 Factors:Dyslipidemia, Hypertension and High Cholesterol.    Sonographer:    Cherene Ravens RDCS  Referring Phys: [id number] KATHRYN R THOMPSON   IMPRESSIONS     1. Left ventricular ejection fraction, by estimation, is 55 to 60%. The  left ventricle has normal function. The left ventricle has no regional  wall motion abnormalities. Left ventricular diastolic parameters are  consistent with Grade I diastolic  dysfunction (impaired relaxation).   2. Right ventricular systolic function is normal. The right ventricular  size is normal. Tricuspid regurgitation signal is inadequate for assessing  PA pressure.   3. Left atrial size was moderately dilated.   4. The mitral valve is degenerative. Moderate mitral valve regurgitation.  Mild mitral stenosis. The mean mitral valve gradient is 4.0 mmHg with  average heart rate of 79 bpm. Severe mitral annular calcification.   5. The aortic valve is calcified. There is severe calcifcation of the  aortic valve. Aortic valve regurgitation is moderate to severe. Severe  aortic valve stenosis. Vmax 4.0 m/s, MG 34 mmHg, AVA 0.9 cm^2, DI 0.24   6. The inferior vena cava is normal in size with greater than 50%  respiratory variability, suggesting right atrial pressure of 3 mmHg.   FINDINGS   Left Ventricle: Left ventricular ejection fraction, by estimation, is 55  to 60%. The left ventricle has normal function. The left ventricle has no  regional wall motion abnormalities. The left ventricular internal cavity  size was normal in size. There is   no left ventricular hypertrophy.  Left ventricular diastolic parameters  are consistent with Grade I diastolic dysfunction (impaired relaxation).   Right Ventricle: The right ventricular size is normal. No increase in  right ventricular wall thickness. Right ventricular systolic function is  normal. Tricuspid regurgitation signal is inadequate for assessing PA  pressure.   Left Atrium: Left atrial size was moderately dilated.   Right Atrium: Right atrial size was normal in size.   Pericardium: There is no evidence of pericardial effusion.   Mitral Valve: The mitral valve is degenerative in appearance. Severe  mitral annular calcification. Moderate mitral valve regurgitation. Mild  mitral valve stenosis. MV peak gradient, 10.0 mmHg. The mean mitral valve  gradient is 4.0 mmHg with average heart   rate of 79 bpm.   Tricuspid Valve: The tricuspid valve is normal in structure. Tricuspid  valve regurgitation is trivial.   Aortic Valve: The aortic valve is calcified. There is severe calcifcation  of the aortic valve. Aortic valve regurgitation is moderate to severe.  Severe aortic stenosis is present. Aortic valve mean gradient measures  26.8 mmHg. Aortic valve peak gradient  measures 63.4 mmHg. Aortic valve area, by VTI measures 0.89 cm.   Pulmonic Valve: The pulmonic valve was not well visualized. Pulmonic valve  regurgitation is trivial.   Aorta: The  aortic root and ascending aorta are structurally normal, with  no evidence of dilitation.   Venous: The inferior vena cava is normal in size with greater than 50%  respiratory variability, suggesting right atrial pressure of 3 mmHg.   IAS/Shunts: The atrial septum is grossly normal.     LEFT VENTRICLE  PLAX 2D  LVIDd:         5.25 cm   Diastology  LVIDs:         3.67 cm   LV e' medial:    5.97 cm/s  LV PW:         1.04 cm   LV E/e' medial:  12.6  LV IVS:        0.91 cm   LV e' lateral:   8.21 cm/s  LVOT diam:     1.90 cm   LV E/e' lateral: 9.2  LV SV:          63  LV SV Index:   35  LVOT Area:     2.84 cm     RIGHT VENTRICLE          IVC  RV Basal diam:  2.57 cm  IVC diam: 1.33 cm  RV Mid diam:    1.57 cm  TAPSE (M-mode): 2.5 cm   LEFT ATRIUM             Index        RIGHT ATRIUM           Index  LA diam:        4.00 cm 2.23 cm/m   RA Pressure: 3.00 mmHg  LA Vol (A2C):   94.8 ml 52.90 ml/m  RA Area:     12.80 cm  LA Vol (A4C):   60.8 ml 33.92 ml/m  RA Volume:   27.40 ml  15.29 ml/m  LA Biplane Vol: 78.8 ml 43.97 ml/m   AORTIC VALVE  AV Area (Vmax):    0.78 cm  AV Area (Vmean):   0.88 cm  AV Area (VTI):     0.89 cm  AV Vmax:           398.00 cm/s  AV Vmean:          240.000 cm/s  AV VTI:            0.704 m  AV Peak Grad:      63.4 mmHg  AV Mean Grad:      26.8 mmHg  LVOT Vmax:         110.00 cm/s  LVOT Vmean:        74.500 cm/s  LVOT VTI:          0.222 m  LVOT/AV VTI ratio: 0.32    AORTA  Ao Root diam: 3.40 cm  Ao Asc diam:  3.60 cm   MITRAL VALVE                TRICUSPID VALVE  MV Area VTI:  1.27 cm      Estimated RAP:  3.00 mmHg  MV Peak grad: 10.0 mmHg  MV Mean grad: 4.0 mmHg      SHUNTS  MV Vmax:      1.58 m/s      Systemic VTI:  0.22 m  MV Vmean:     85.2 cm/s     Systemic Diam: 1.90 cm  MV E velocity: 75.30 cm/s  MV A velocity: 146.00 cm/s  MV E/A ratio:  0.52   Lonni Nanas MD  Electronically signed by Lonni Nanas MD  Signature Date/Time: 04/11/2024/10:17:38 AM        Final        ADDENDUM REPORT: 04/14/2024 13:27   EXAM: OVER-READ INTERPRETATION  CT CHEST   The following report is an over-read performed by radiologist Dr. Fonda Mom [facility] on 04/14/2024. This over-read does not include interpretation of cardiac or coronary anatomy or pathology. The coronary CTA interpretation by the cardiologist is attached.   COMPARISON:  04/11/2024.   FINDINGS: Cardiovascular:  See findings discussed in the body of the report.   Mediastinum/Nodes: No suspicious  adenopathy identified. Imaged mediastinal structures are unremarkable.   Lungs/Pleura: Imaged lungs are clear. No pleural effusion or pneumothorax.   Upper Abdomen: No acute abnormality.   Musculoskeletal: No chest wall abnormality. No acute osseous findings. Thoracic degenerative changes.   IMPRESSION: No acute extracardiac incidental findings.     Electronically Signed   By: Fonda Field M.D.   On: 04/14/2024 13:27    Addended by Field Fonda BROCKS, MD on 04/14/2024  1:29 PM    Study Result   Narrative & Impression  CLINICAL DATA:  Severe Aortic Stenosis.  2023 study for comparison.   EXAM: Cardiac TAVR CT   TECHNIQUE: A non-contrast, gated CT scan was obtained with axial slices of 2.5 mm through the heart for aortic valve scoring. A 120 kV retrospective, gated, contrast cardiac scan was obtained. Gantry rotation speed was 230 msec and collimation was 0.63 mm. Nitroglycerin was not given. A delayed scan was obtained to exclude left atrial appendage thrombus. The 3D dataset was reconstructed in systole with motion correction. The 3D data set was reconstructed in 5% intervals of the 0-95% of the R-R cycle. Systolic and diastolic phases were analyzed on a dedicated workstation using MPR, MIP, and VRT modes. The patient received 100 cc of contrast.   FINDINGS: Aortic Valve: Valve Morphology: Tricuspid with asymmetric, bulky calcification and reduced excursion the planimeter valve area is 1.19 Sq cm consistent with severe aortic stenosis   Annular and subannular calcification: Severe annular, crescentic, posterior calcification that extends into the LVOT and the intervalvular fibrosa.   Aortic Valve Calcium  Score: 6766   Presence of basal septal hypertrophy: Severe, but diastolic annulus is less that systolic annulus   Perimembranous septal diameter: 5 mm   Mitral Valve: Severe mitral annular calcification.   Aortic Annulus Measurements-31% Phase   Major  annulus diameter: 31 mm   Minor annulus diameter: 26 mm   Annular perimeter: 90 mm   Annular area: 6.34 cm2   Aortic Measurements- 75% phase   Sinotubular Junction: 37 mm   Ascending Thoracic Aorta: 46 mm aneurysmal   Descending Thoracic Aorta: 27 mm   Aortic atherosclerosis.   Sinus of Valsalva Measurements:   Right coronary cusp width: 35 mm   Left coronary cusp width: 35 mm   Non coronary cusp width: 35 mm   Coronary Artery Height above Annulus:   Left Main: 14 mm   Left SoV height: 20 mm   Right Coronary: 14 mm   Right SoV height: 19 mm   Optimum Fluoroscopic Angle for Delivery: LAO 5, CAU 3   Valves for structural team consideration:   29 mm Sapien recommended in the setting of CAD.   34 mm Evolut is feasible   Non TAVR Valve Findings:   Coronary Arteries: Normal coronary origin. The study was performed without use of NTG and is insufficient for full plaque evaluation. The following assessment  was made of the proximal segments:   Left main: The left main is a large caliber vessel with a normal take off from the left coronary cusp that bifurcates into a LAD & LCX. There is no plaque or stenosis.   LAD: Moderate calcified plaque stenosis on non-optimized study.   LCX: Moderate calcified plaque stenosis on non-optimized study.   RCA: Moderate calcified plaque stenosis on non-optimized study.   Consider heart catheterization as part of TAVR work up.   Systemic veins: Normal anatomy.   Main Pulmonary artery: Normal caliber   Pulmonary veins: Normal variant anatomy- right middle pulmonary vein.   Left atrial appendage: Patent.   Interatrial septum: No clear communications   Chamber dimensions: Right ventricular dilation, left atrial dimension, papillary muscle hypertrophy.   Pericardium: No significant calcifications.   Extra Cardiac Findings as per separate reporting.   Image quality: Excellent.   Noise artifact is: Limited.    IMPRESSION: 1. Severe Aortic stenosis. Measurements for potential interventions as above. Sizing recommendations unchanged from 2023 study.   Stanly Leavens MD   Electronically Signed: By: Stanly Leavens M.D. On: 04/11/2024 18:52        Narrative & Impression  CLINICAL DATA:  Aortic valve replacement, preoperative evaluation   EXAM: CTA ABDOMEN AND PELVIS WITHOUT AND WITH CONTRAST   TECHNIQUE: Multidetector CT imaging of the abdomen and pelvis was performed using the standard protocol during bolus administration of intravenous contrast. Multiplanar reconstructed images and MIPs were obtained and reviewed to evaluate the vascular anatomy.   RADIATION DOSE REDUCTION: This exam was performed according to the departmental dose-optimization program which includes automated exposure control, adjustment of the mA and/or kV according to patient size and/or use of iterative reconstruction technique.   CONTRAST:  OMNIPAQUE  IOHEXOL  350 MG/ML SOLN   COMPARISON:  None Available.   FINDINGS: VASCULAR   Aorta: Mild diffuse atherosclerotic changes are present in the abdominal aorta with mild ectasia. The infrarenal segment measures 2.6 cm in diameter. Minimal luminal diameter measures 13 mm.   Celiac: Patent.   SMA: Patent.   Renals: Mild atherosclerotic changes bilaterally, patent.   IMA: Likely occluded at the origin.   Inflow: Right common iliac artery is mildly dilated in the distal segment measuring 1.4 cm. Minimal luminal dimension measures 8 mm. The right external iliac artery is patent. The right internal iliac artery is patent. Minimal diameter of the right external iliac artery measures 7 mm.   The left common iliac artery is patent with minimal luminal diameter of 9 mm. The distal left common iliac artery is ectatic measuring 1.3 cm. Left internal and external iliac arteries are patent. Mild atherosclerotic changes in the left external iliac  artery with minimal luminal diameter measuring 8 mm.   Proximal Outflow: The right common femoral artery is patent with mild atherosclerotic changes and minimal luminal diameter of 7 mm. Left common femoral artery is patent with mild atherosclerotic changes and minimal luminal diameter of 7 mm.   Veins: No obvious venous abnormality within the limitations of this arterial phase study.   Review of the MIP images confirms the above findings.   NON-VASCULAR   Lower chest: Reported separately.   Hepatobiliary: No intrahepatic or extrahepatic biliary ductal dilatation. The gallbladder is grossly unremarkable.   Pancreas: Unremarkable. No pancreatic ductal dilatation or surrounding inflammatory changes.   Spleen: Normal in size without focal abnormality.   Adrenals/Urinary Tract: The adrenal glands are grossly unremarkable. Cortical thinning is present in both kidneys.  There is no overt hydronephrosis. Calcifications are present in the urinary bladder. Small simple cysts are suspected in the kidneys with the largest in the left kidney measuring 1.6 cm.   Stomach/Bowel: The stomach is nondilated. No dilated loops of bowel are appreciated. An enteric anastomosis is present in the distal left colon. Scattered colonic diverticulosis.   Lymphatic: No significant lymphadenopathy.   Reproductive: Markedly enlarged prostate with mass effect on the pelvic organs.   Other: No abdominal wall hernia or abnormality. No abdominopelvic ascites.   Musculoskeletal: Degenerative changes in the imaged osseous structures.   IMPRESSION: 1. Minimal luminal diameters detailed above.     Electronically Signed   By: Maude Naegeli M.D.   On: 04/12/2024 09:00        Impression:   This 83 year old gentleman has stage D, severe aortic stenosis with recent episodes of dizziness with position changes which sound like they are likely related to his aortic stenosis.  He has been walking 2 miles per  day and denies any exertional fatigue or shortness of breath although he has been fairly asymptomatic all along despite being active.  I have personally reviewed his 2D echocardiogram and CTA studies.  His echocardiogram shows a severely calcified aortic valve with markedly restricted leaflet mobility.  The mean gradient is 34 mmHg with a valve area of 0.9 cm and dimensionless index of 0.24.  He also has moderate to severe aortic insufficiency, severe mitral annular calcification with mild mitral stenosis with a mean gradient of 4 mmHg and moderate mitral regurgitation.  Left ventricular ejection fraction is 50 to 55% with a low stroke-volume index of 35.  His valve looks severely stenotic. I think aortic valve replacement is indicated in this patient to prevent progressive left ventricular dysfunction and development of congestive heart failure symptoms.  Given his advanced age I think transcatheter aortic valve replacement would be the best option for treating him.  His gated cardiac CTA shows anatomy suitable for TAVR using a 29 mm SAPIEN 3 valve.  His abdominal and pelvic CTA shows adequate pelvic vascular anatomy to allow transfemoral insertion.  He has a 4.6 cm fusiform ascending aortic aneurysm that is still well below the surgical threshold and can be followed.  He has not had a cardiac catheterization yet.   The patient was counseled at length regarding treatment alternatives for management of severe symptomatic aortic stenosis. The risks and benefits of surgical intervention has been discussed in detail. Long-term prognosis with medical therapy was discussed. Alternative approaches such as conventional surgical aortic valve replacement, transcatheter aortic valve replacement, and palliative medical therapy were compared and contrasted at length. This discussion was placed in the context of the patient's own specific clinical presentation and past medical history. All of his questions have been  addressed.  He would like to continue thinking about proceeding with TAVR.     Plan:   Transfemoral TAVR using a Sapien 3 valve.     Dorise LOIS Fellers, MD

## 2024-05-29 NOTE — Telephone Encounter (Signed)
 Pt would like to know if he will be staying overnight after his TAVR procedure tomorrow. Please advise.

## 2024-05-29 NOTE — Telephone Encounter (Signed)
 This was sent to preop. This is not a procedure that needs to go through preop team, these are our doctors.

## 2024-05-30 ENCOUNTER — Inpatient Hospital Stay (HOSPITAL_COMMUNITY)

## 2024-05-30 ENCOUNTER — Inpatient Hospital Stay (HOSPITAL_COMMUNITY)
Admission: RE | Admit: 2024-05-30 | Discharge: 2024-05-31 | DRG: 266 | Disposition: A | Discharge status: Home / Self Care | Attending: Surgery | Admitting: Surgery

## 2024-05-30 ENCOUNTER — Encounter (HOSPITAL_COMMUNITY): Payer: Self-pay | Admitting: Internal Medicine

## 2024-05-30 ENCOUNTER — Inpatient Hospital Stay (HOSPITAL_COMMUNITY): Payer: Self-pay

## 2024-05-30 ENCOUNTER — Encounter (HOSPITAL_COMMUNITY)
Admission: RE | Disposition: A | Discharge status: Home / Self Care | Payer: Self-pay | Source: Home / Self Care | Attending: Surgery

## 2024-05-30 ENCOUNTER — Encounter (HOSPITAL_COMMUNITY): Payer: Self-pay | Admitting: Physician Assistant

## 2024-05-30 ENCOUNTER — Other Ambulatory Visit: Payer: Self-pay

## 2024-05-30 DIAGNOSIS — I351 Nonrheumatic aortic (valve) insufficiency: Secondary | ICD-10-CM | POA: Diagnosis not present

## 2024-05-30 DIAGNOSIS — Z006 Encounter for examination for normal comparison and control in clinical research program: Secondary | ICD-10-CM

## 2024-05-30 DIAGNOSIS — Z833 Family history of diabetes mellitus: Secondary | ICD-10-CM | POA: Diagnosis not present

## 2024-05-30 DIAGNOSIS — I712 Thoracic aortic aneurysm, without rupture, unspecified: Secondary | ICD-10-CM

## 2024-05-30 DIAGNOSIS — I447 Left bundle-branch block, unspecified: Secondary | ICD-10-CM | POA: Diagnosis present

## 2024-05-30 DIAGNOSIS — N182 Chronic kidney disease, stage 2 (mild): Secondary | ICD-10-CM | POA: Diagnosis present

## 2024-05-30 DIAGNOSIS — I359 Nonrheumatic aortic valve disorder, unspecified: Principal | ICD-10-CM | POA: Diagnosis present

## 2024-05-30 DIAGNOSIS — E876 Hypokalemia: Secondary | ICD-10-CM | POA: Diagnosis present

## 2024-05-30 DIAGNOSIS — D509 Iron deficiency anemia, unspecified: Secondary | ICD-10-CM | POA: Diagnosis not present

## 2024-05-30 DIAGNOSIS — G459 Transient cerebral ischemic attack, unspecified: Secondary | ICD-10-CM | POA: Diagnosis present

## 2024-05-30 DIAGNOSIS — M109 Gout, unspecified: Secondary | ICD-10-CM | POA: Diagnosis present

## 2024-05-30 DIAGNOSIS — I059 Rheumatic mitral valve disease, unspecified: Secondary | ICD-10-CM

## 2024-05-30 DIAGNOSIS — Z952 Presence of prosthetic heart valve: Principal | ICD-10-CM

## 2024-05-30 DIAGNOSIS — Z8249 Family history of ischemic heart disease and other diseases of the circulatory system: Secondary | ICD-10-CM | POA: Diagnosis not present

## 2024-05-30 DIAGNOSIS — Z79899 Other long term (current) drug therapy: Secondary | ICD-10-CM | POA: Diagnosis not present

## 2024-05-30 DIAGNOSIS — I129 Hypertensive chronic kidney disease with stage 1 through stage 4 chronic kidney disease, or unspecified chronic kidney disease: Secondary | ICD-10-CM | POA: Diagnosis not present

## 2024-05-30 DIAGNOSIS — I5033 Acute on chronic diastolic (congestive) heart failure: Secondary | ICD-10-CM | POA: Diagnosis present

## 2024-05-30 DIAGNOSIS — I10 Essential (primary) hypertension: Secondary | ICD-10-CM

## 2024-05-30 DIAGNOSIS — Z7982 Long term (current) use of aspirin: Secondary | ICD-10-CM | POA: Diagnosis not present

## 2024-05-30 DIAGNOSIS — E782 Mixed hyperlipidemia: Secondary | ICD-10-CM | POA: Diagnosis not present

## 2024-05-30 DIAGNOSIS — D696 Thrombocytopenia, unspecified: Secondary | ICD-10-CM | POA: Diagnosis present

## 2024-05-30 DIAGNOSIS — Z9049 Acquired absence of other specified parts of digestive tract: Secondary | ICD-10-CM

## 2024-05-30 DIAGNOSIS — I130 Hypertensive heart and chronic kidney disease with heart failure and stage 1 through stage 4 chronic kidney disease, or unspecified chronic kidney disease: Secondary | ICD-10-CM | POA: Diagnosis not present

## 2024-05-30 DIAGNOSIS — I350 Nonrheumatic aortic (valve) stenosis: Secondary | ICD-10-CM | POA: Diagnosis not present

## 2024-05-30 DIAGNOSIS — Z809 Family history of malignant neoplasm, unspecified: Secondary | ICD-10-CM | POA: Diagnosis not present

## 2024-05-30 DIAGNOSIS — I7121 Aneurysm of the ascending aorta, without rupture: Secondary | ICD-10-CM | POA: Diagnosis not present

## 2024-05-30 DIAGNOSIS — Z888 Allergy status to other drugs, medicaments and biological substances status: Secondary | ICD-10-CM | POA: Diagnosis not present

## 2024-05-30 DIAGNOSIS — I2510 Atherosclerotic heart disease of native coronary artery without angina pectoris: Secondary | ICD-10-CM | POA: Diagnosis present

## 2024-05-30 DIAGNOSIS — N400 Enlarged prostate without lower urinary tract symptoms: Secondary | ICD-10-CM | POA: Diagnosis present

## 2024-05-30 DIAGNOSIS — Z820 Family history of epilepsy and other diseases of the nervous system: Secondary | ICD-10-CM

## 2024-05-30 DIAGNOSIS — N179 Acute kidney failure, unspecified: Secondary | ICD-10-CM | POA: Diagnosis not present

## 2024-05-30 DIAGNOSIS — Z8673 Personal history of transient ischemic attack (TIA), and cerebral infarction without residual deficits: Secondary | ICD-10-CM | POA: Diagnosis not present

## 2024-05-30 DIAGNOSIS — N1832 Chronic kidney disease, stage 3b: Secondary | ICD-10-CM | POA: Diagnosis not present

## 2024-05-30 DIAGNOSIS — I340 Nonrheumatic mitral (valve) insufficiency: Secondary | ICD-10-CM

## 2024-05-30 HISTORY — DX: Rheumatic mitral valve disease, unspecified: I05.9

## 2024-05-30 HISTORY — PX: INTRAOPERATIVE TRANSTHORACIC ECHOCARDIOGRAM: SHX6523

## 2024-05-30 LAB — GLUCOSE, CAPILLARY: Glucose-Capillary: 69 mg/dL — ABNORMAL LOW (ref 70–99)

## 2024-05-30 LAB — ECHOCARDIOGRAM LIMITED
AR max vel: 2.37 cm2
AV Area VTI: 2.38 cm2
AV Area mean vel: 2.62 cm2
AV Mean grad: 4 mmHg
AV Peak grad: 6.7 mmHg
Ao pk vel: 1.29 m/s
MV VTI: 2.58 cm2
P 1/2 time: 251 ms

## 2024-05-30 LAB — POCT I-STAT, CHEM 8
BUN: 24 mg/dL — ABNORMAL HIGH (ref 8–23)
Calcium, Ion: 1.19 mmol/L (ref 1.15–1.40)
Chloride: 108 mmol/L (ref 98–111)
Creatinine, Ser: 1.1 mg/dL (ref 0.61–1.24)
Glucose, Bld: 93 mg/dL (ref 70–99)
HCT: 31 % — ABNORMAL LOW (ref 39.0–52.0)
Hemoglobin: 10.5 g/dL — ABNORMAL LOW (ref 13.0–17.0)
Potassium: 3.3 mmol/L — ABNORMAL LOW (ref 3.5–5.1)
Sodium: 145 mmol/L (ref 135–145)
TCO2: 22 mmol/L (ref 22–32)

## 2024-05-30 LAB — POCT ACTIVATED CLOTTING TIME: Activated Clotting Time: 337 s

## 2024-05-30 SURGERY — TRANSCATHETER AORTIC VALVE REPLACEMENT, TRANSFEMORAL (CATHLAB)
Anesthesia: Monitor Anesthesia Care

## 2024-05-30 MED ORDER — SODIUM CHLORIDE 0.9 % IV SOLN: INTRAVENOUS | Status: DC

## 2024-05-30 MED ORDER — NITROGLYCERIN IN D5W 200-5 MCG/ML-% IV SOLN: 0.0000 ug/min | INTRAVENOUS | Status: DC

## 2024-05-30 MED ORDER — IODIXANOL 320 MG/ML IV SOLN
INTRAVENOUS | Status: DC | PRN
  Administered 2024-05-30: 60 mL via INTRA_ARTERIAL

## 2024-05-30 MED ORDER — FUROSEMIDE 10 MG/ML IJ SOLN
20.0000 mg | Freq: Once | INTRAMUSCULAR | Status: AC
  Administered 2024-05-30: 20 mg via INTRAVENOUS
  Filled 2024-05-30: qty 2

## 2024-05-30 MED ORDER — PROPOFOL 500 MG/50ML IV EMUL
INTRAVENOUS | Status: DC | PRN
  Administered 2024-05-30: 25 ug/kg/min via INTRAVENOUS

## 2024-05-30 MED ORDER — PROTAMINE SULFATE 10 MG/ML IV SOLN
INTRAVENOUS | Status: DC | PRN
  Administered 2024-05-30: 10 mg via INTRAVENOUS
  Administered 2024-05-30: 90 mg via INTRAVENOUS

## 2024-05-30 MED ORDER — FINASTERIDE 5 MG PO TABS
5.0000 mg | ORAL_TABLET | Freq: Every day | ORAL | Status: DC
  Administered 2024-05-30 – 2024-05-31 (×2): 5 mg via ORAL
  Filled 2024-05-30 (×2): qty 1

## 2024-05-30 MED ORDER — PHENYLEPHRINE 80 MCG/ML (10ML) SYRINGE FOR IV PUSH (FOR BLOOD PRESSURE SUPPORT)
PREFILLED_SYRINGE | INTRAVENOUS | Status: DC | PRN
  Administered 2024-05-30 (×2): 40 ug via INTRAVENOUS
  Administered 2024-05-30: 80 ug via INTRAVENOUS
  Administered 2024-05-30: 40 ug via INTRAVENOUS

## 2024-05-30 MED ORDER — SODIUM CHLORIDE 0.9 % IV SOLN: INTRAVENOUS | Status: DC | PRN

## 2024-05-30 MED ORDER — LIDOCAINE 2% (20 MG/ML) 5 ML SYRINGE
INTRAMUSCULAR | Status: DC | PRN
  Administered 2024-05-30: 60 mg via INTRAVENOUS

## 2024-05-30 MED ORDER — CHLORHEXIDINE GLUCONATE 4 % EX SOLN: 60.0000 mL | Freq: Once | CUTANEOUS | Status: DC

## 2024-05-30 MED ORDER — ASPIRIN 81 MG PO CHEW
81.0000 mg | CHEWABLE_TABLET | Freq: Every day | ORAL | Status: DC
  Administered 2024-05-31: 81 mg via ORAL
  Filled 2024-05-30: qty 1

## 2024-05-30 MED ORDER — MORPHINE SULFATE (PF) 2 MG/ML IV SOLN
1.0000 mg | INTRAVENOUS | Status: DC | PRN
  Administered 2024-05-30: 1 mg via INTRAVENOUS
  Administered 2024-05-30: 2 mg via INTRAVENOUS
  Administered 2024-05-30: 3 mg via INTRAVENOUS
  Filled 2024-05-30 (×2): qty 1
  Filled 2024-05-30: qty 2

## 2024-05-30 MED ORDER — CHLORHEXIDINE GLUCONATE 4 % EX SOLN
30.0000 mL | CUTANEOUS | Status: DC
  Filled 2024-05-30: qty 30

## 2024-05-30 MED ORDER — NOREPINEPHRINE 4 MG/250ML-% IV SOLN: 0.0000 ug/min | INTRAVENOUS | Status: DC

## 2024-05-30 MED ORDER — ONDANSETRON HCL 4 MG/2ML IJ SOLN: 4.0000 mg | Freq: Four times a day (QID) | INTRAMUSCULAR | Status: DC | PRN

## 2024-05-30 MED ORDER — SODIUM CHLORIDE 0.9% FLUSH: 3.0000 mL | INTRAVENOUS | Status: DC | PRN

## 2024-05-30 MED ORDER — ACETAMINOPHEN 325 MG PO TABS: 650.0000 mg | ORAL_TABLET | Freq: Four times a day (QID) | ORAL | Status: DC | PRN

## 2024-05-30 MED ORDER — MAGNESIUM OXIDE -MG SUPPLEMENT 400 (240 MG) MG PO TABS
400.0000 mg | ORAL_TABLET | Freq: Every day | ORAL | Status: DC
  Administered 2024-05-30 – 2024-05-31 (×2): 400 mg via ORAL
  Filled 2024-05-30 (×2): qty 1

## 2024-05-30 MED ORDER — SODIUM CHLORIDE 0.9% FLUSH
3.0000 mL | Freq: Two times a day (BID) | INTRAVENOUS | Status: DC
  Administered 2024-05-30 – 2024-05-31 (×2): 3 mL via INTRAVENOUS

## 2024-05-30 MED ORDER — ALLOPURINOL 300 MG PO TABS
300.0000 mg | ORAL_TABLET | Freq: Every day | ORAL | Status: DC
  Administered 2024-05-30 – 2024-05-31 (×2): 300 mg via ORAL
  Filled 2024-05-30 (×2): qty 1

## 2024-05-30 MED ORDER — LIDOCAINE HCL (PF) 1 % IJ SOLN
INTRAMUSCULAR | Status: AC
  Filled 2024-05-30: qty 30

## 2024-05-30 MED ORDER — TRAMADOL HCL 50 MG PO TABS: 50.0000 mg | ORAL_TABLET | ORAL | Status: DC | PRN

## 2024-05-30 MED ORDER — ACETAMINOPHEN 650 MG RE SUPP: 650.0000 mg | Freq: Four times a day (QID) | RECTAL | Status: DC | PRN

## 2024-05-30 MED ORDER — CEFAZOLIN SODIUM-DEXTROSE 2-4 GM/100ML-% IV SOLN
2.0000 g | Freq: Three times a day (TID) | INTRAVENOUS | Status: AC
  Administered 2024-05-30 – 2024-05-31 (×2): 2 g via INTRAVENOUS
  Filled 2024-05-30 (×2): qty 100

## 2024-05-30 MED ORDER — SODIUM CHLORIDE 0.9 % IV SOLN: INTRAVENOUS | Status: AC

## 2024-05-30 MED ORDER — SODIUM CHLORIDE 0.9 % IV SOLN: 250.0000 mL | INTRAVENOUS | Status: DC | PRN

## 2024-05-30 MED ORDER — POTASSIUM CHLORIDE CRYS ER 20 MEQ PO TBCR
40.0000 meq | EXTENDED_RELEASE_TABLET | Freq: Once | ORAL | Status: AC
Start: 2024-05-30 — End: 2024-05-30
  Administered 2024-05-30: 40 meq via ORAL
  Filled 2024-05-30: qty 2

## 2024-05-30 MED ORDER — CHLORHEXIDINE GLUCONATE 0.12 % MT SOLN: 15.0000 mL | Freq: Once | OROMUCOSAL | Status: AC

## 2024-05-30 MED ORDER — PROPOFOL 10 MG/ML IV BOLUS
INTRAVENOUS | Status: DC | PRN
  Administered 2024-05-30 (×2): 10 mg via INTRAVENOUS

## 2024-05-30 MED ORDER — CHLORHEXIDINE GLUCONATE 4 % EX SOLN
60.0000 mL | Freq: Once | CUTANEOUS | Status: AC
  Administered 2024-05-30: 4 via TOPICAL
  Filled 2024-05-30: qty 60

## 2024-05-30 MED ORDER — CLEVIDIPINE BUTYRATE 0.5 MG/ML IV EMUL: INTRAVENOUS | Status: DC | PRN

## 2024-05-30 MED ORDER — LIDOCAINE HCL (PF) 1 % IJ SOLN
INTRAMUSCULAR | Status: DC | PRN
  Administered 2024-05-30: 30 mL

## 2024-05-30 MED ORDER — CHLORHEXIDINE GLUCONATE 0.12 % MT SOLN
OROMUCOSAL | Status: AC
  Administered 2024-05-30: 15 mL via OROMUCOSAL
  Filled 2024-05-30: qty 15

## 2024-05-30 MED ORDER — OXYCODONE HCL 5 MG PO TABS
5.0000 mg | ORAL_TABLET | ORAL | Status: DC | PRN
  Administered 2024-05-31 (×2): 10 mg via ORAL
  Filled 2024-05-30 (×2): qty 2

## 2024-05-30 MED ORDER — HEPARIN SODIUM (PORCINE) 1000 UNIT/ML IJ SOLN
INTRAMUSCULAR | Status: DC | PRN
  Administered 2024-05-30: 10000 [IU] via INTRAVENOUS

## 2024-05-30 MED ORDER — CLEVIDIPINE BUTYRATE 0.5 MG/ML IV EMUL
INTRAVENOUS | Status: DC | PRN
  Administered 2024-05-30: 3 mg/h via INTRAVENOUS

## 2024-05-30 SURGICAL SUPPLY — 30 items
BAG SNAP BAND KOVER 36X36 (MISCELLANEOUS) ×2 IMPLANT
CABLE ADAPT PACING TEMP 12FT (ADAPTER) IMPLANT
CABLE SURGICAL S-101-97-12 (CABLE) IMPLANT
CATH COMMANDER DELIVERY SYS 29 (CATHETERS) IMPLANT
CATH DIAG 6FR PIGTAIL ANGLED (CATHETERS) IMPLANT
CATH INFINITI 5FR ANG PIGTAIL (CATHETERS) IMPLANT
CATH INFINITI 6F AL1 (CATHETERS) IMPLANT
CATH INFINITI 6F AL2 (CATHETERS) IMPLANT
CLOSURE MYNX CONTROL 6F/7F (Vascular Products) IMPLANT
CLOSURE PERCLOSE PROSTYLE (Vascular Products) IMPLANT
CRIMPER (MISCELLANEOUS) IMPLANT
DEVICE INFLATION ATRION QL38 (MISCELLANEOUS) IMPLANT
KIT SAPIAN 3 ULTRA RESILIA 29 (Valve) IMPLANT
PACK CARDIAC CATHETERIZATION (CUSTOM PROCEDURE TRAY) ×1 IMPLANT
PAD SORBX EP SHIELD 16.5X12 (MISCELLANEOUS) IMPLANT
SET ATX-X65L (MISCELLANEOUS) IMPLANT
SHEATH INTRODUCER SET 29 (SHEATH) IMPLANT
SHEATH PINNACLE 6F 10CM (SHEATH) IMPLANT
SHEATH PINNACLE 8F 10CM (SHEATH) IMPLANT
STOPCOCK MORSE 400PSI 3WAY (MISCELLANEOUS) ×2 IMPLANT
SYR CONTROL 10ML ANGIOGRAPHIC (SYRINGE) IMPLANT
TUBING ART PRESS 72 MALE/FEM (TUBING) IMPLANT
TUBING CIL FLEX 10 FLL-RA (TUBING) IMPLANT
WIRE AMPLATZ SS-J .035X180CM (WIRE) IMPLANT
WIRE EMERALD 3MM-J .035X150CM (WIRE) IMPLANT
WIRE EMERALD 3MM-J .035X260CM (WIRE) IMPLANT
WIRE EMERALD ST .035X260CM (WIRE) IMPLANT
WIRE MICRO SET SILHO 5FR 7 (SHEATH) IMPLANT
WIRE MICROINTRODUCER 60CM (WIRE) IMPLANT
WIRE SAFARI SM CURVE 275 (WIRE) IMPLANT

## 2024-05-30 NOTE — Anesthesia Preprocedure Evaluation (Addendum)
 Anesthesia Evaluation  Patient identified by MRN, date of birth, ID band Patient awake    Reviewed: Allergy & Precautions, NPO status , Patient's Chart, lab work & pertinent test results  Airway Mallampati: II  TM Distance: >3 FB Neck ROM: Full    Dental no notable dental hx.    Pulmonary neg pulmonary ROS   Pulmonary exam normal        Cardiovascular hypertension, + Valvular Problems/Murmurs AS  Rhythm:Regular Rate:Normal + Systolic murmurs ECHO: 1. Left ventricular ejection fraction, by estimation, is 55 to 60%. The  left ventricle has normal function. The left ventricle has no regional  wall motion abnormalities. Left ventricular diastolic parameters are  consistent with Grade I diastolic  dysfunction (impaired relaxation).   2. Right ventricular systolic function is normal. The right ventricular  size is normal. Tricuspid regurgitation signal is inadequate for assessing  PA pressure.   3. Left atrial size was moderately dilated.   4. The mitral valve is degenerative. Moderate mitral valve regurgitation.  Mild mitral stenosis. The mean mitral valve gradient is 4.0 mmHg with  average heart rate of 79 bpm. Severe mitral annular calcification.   5. The aortic valve is calcified. There is severe calcifcation of the  aortic valve. Aortic valve regurgitation is moderate to severe. Severe  aortic valve stenosis. Vmax 4.0 m/s, MG 34 mmHg, AVA 0.9 cm^2, DI 0.24   6. The inferior vena cava is normal in size with greater than 50%  respiratory variability, suggesting right atrial pressure of 3 mmHg.     Neuro/Psych  Headaches TIA   GI/Hepatic Neg liver ROS, hiatal hernia,,,  Endo/Other  negative endocrine ROS    Renal/GU Renal InsufficiencyRenal disease     Musculoskeletal negative musculoskeletal ROS (+)    Abdominal   Peds  Hematology negative hematology ROS (+)   Anesthesia Other Findings Severe Aortic Stenosis   Reproductive/Obstetrics                              Anesthesia Physical Anesthesia Plan  ASA: 4  Anesthesia Plan: MAC   Post-op Pain Management:    Induction:   PONV Risk Score and Plan: 1 and Ondansetron , Dexamethasone  and Treatment may vary due to age or medical condition  Airway Management Planned: Natural Airway  Additional Equipment:   Intra-op Plan:   Post-operative Plan:   Informed Consent: I have reviewed the patients History and Physical, chart, labs and discussed the procedure including the risks, benefits and alternatives for the proposed anesthesia with the patient or authorized representative who has indicated his/her understanding and acceptance.     Dental advisory given  Plan Discussed with: CRNA  Anesthesia Plan Comments:          Anesthesia Quick Evaluation

## 2024-05-30 NOTE — Op Note (Signed)
 [facility]   MULTIDISCIPLINARY HEART VALVE TEAM   TAVR OPERATIVE NOTE   Date of Procedure:  05/30/2024  Preoperative Diagnosis: Severe Aortic Stenosis   Postoperative Diagnosis: Same   Procedure:   Transcatheter Aortic Valve Replacement - Percutaneous Right Transfemoral Approach  Edwards Sapien 3 Ultra Resilia THV (size 29 mm, model # 9755RSL, serial # [id number])   Co-Surgeons:  Dorise LOIS Fellers, MD and Ozell Fell, MD  /  [name] / Lurena Red, MD   Anesthesiologist:  Patrisha, MD  Echocardiographer:  Santo, MD  Pre-operative Echo Findings: Severe aortic stenosis Normal left ventricular systolic function  Post-operative Echo Findings: No paravalvular leak Normal left ventricular systolic function   BRIEF CLINICAL NOTE AND INDICATIONS FOR SURGERY  This 83 year old gentleman has stage D, severe aortic stenosis with recent episodes of dizziness with position changes which sound like they are likely related to his aortic stenosis.  He has been walking 2 miles per day and denies any exertional fatigue or shortness of breath although he has been fairly asymptomatic all along despite being active.  I have personally reviewed his 2D echocardiogram and CTA studies.  His echocardiogram shows a severely calcified aortic valve with markedly restricted leaflet mobility.  The mean gradient is 34 mmHg with a valve area of 0.9 cm and dimensionless index of 0.24.  He also has moderate to severe aortic insufficiency, severe mitral annular calcification with mild mitral stenosis with a mean gradient of 4 mmHg and moderate mitral regurgitation.  Left ventricular ejection fraction is 50 to 55% with a low stroke-volume index of 35.  His valve looks severely stenotic. I think aortic valve replacement is indicated in this patient to prevent progressive left ventricular dysfunction and development of congestive heart failure symptoms.  Given his advanced age I  think transcatheter aortic valve replacement would be the best option for treating him.  His gated cardiac CTA shows anatomy suitable for TAVR using a 29 mm SAPIEN 3 valve.  His abdominal and pelvic CTA shows adequate pelvic vascular anatomy to allow transfemoral insertion.  He has a 4.6 cm fusiform ascending aortic aneurysm that is still well below the surgical threshold and can be followed.  He has not had a cardiac catheterization yet.   The patient was counseled at length regarding treatment alternatives for management of severe symptomatic aortic stenosis. The risks and benefits of surgical intervention has been discussed in detail. Long-term prognosis with medical therapy was discussed. Alternative approaches such as conventional surgical aortic valve replacement, transcatheter aortic valve replacement, and palliative medical therapy were compared and contrasted at length. This discussion was placed in the context of the patient's own specific clinical presentation and past medical history. All of his questions have been addressed.    DETAILS OF THE OPERATIVE PROCEDURE  PREPARATION:    The patient was brought to the operating room on the above mentioned date and appropriate monitoring was established by the anesthesia team. The patient was placed in the supine position on the operating table.  Intravenous antibiotics were administered. The patient was monitored closely throughout the procedure under conscious sedation.   Baseline transthoracic echocardiogram was performed. The patient's abdomen and both groins were prepped and draped in a sterile manner. A time out procedure was performed.   PERIPHERAL ACCESS:    Using the modified Seldinger technique, femoral arterial and venous access was obtained with placement of 6 Fr sheaths on the left and right sides respectively.  A pigtail diagnostic catheter was passed  through the left arterial sheath under fluoroscopic guidance into the aortic root.   Aortic root angiography was performed in order to determine the optimal angiographic angle for valve deployment.   TRANSFEMORAL ACCESS:   Percutaneous transfemoral access and sheath placement was performed using ultrasound guidance.  The right common femoral artery was cannulated using a micropuncture needle and appropriate location was verified using hand injection angiogram.  A pair of Abbott Perclose percutaneous closure devices were placed and a 6 French sheath replaced into the femoral artery.  The patient was heparinized systemically and ACT verified > 250 seconds.    A  16 Fr transfemoral E-sheath was introduced into the right common femoral artery after progressively dilating over an Amplatz superstiff wire. An AL-2 catheter was used to direct a straight-tip exchange length wire across the native aortic valve into the left ventricle. This was exchanged out for a pigtail catheter and position was confirmed in the LV apex. Simultaneous LV and Ao pressures were recorded.  The pigtail catheter was exchanged for a Safari wire in the LV apex. Direct LV pacing threshold was tested and was satisfactory.  BALLOON AORTIC VALVULOPLASTY:   Not performed   TRANSCATHETER HEART VALVE DEPLOYMENT:   An Edwards Sapien 3 Ultra transcatheter heart valve (size 29 mm) was prepared and crimped per manufacturer's guidelines, and the proper orientation of the valve is confirmed on the [employer] delivery system. The valve was advanced through the introducer sheath using normal technique until in an appropriate position in the abdominal aorta beyond the sheath tip. The balloon was then retracted and using the fine-tuning wheel was centered on the valve. The valve was then advanced across the aortic arch using appropriate flexion of the catheter. The valve was carefully positioned across the aortic valve annulus. The Commander catheter was retracted using normal technique. Once final position of the valve has  been confirmed by angiographic assessment, the valve is deployed during rapid ventricular pacing to maintain systolic blood pressure < 50 mmHg and pulse pressure < 10 mmHg. The balloon inflation is held for >3 seconds after reaching full deployment volume. Once the balloon has fully deflated the balloon is retracted into the ascending aorta and valve function is assessed using echocardiography. There is felt to be  paravalvular leak and no central aortic insufficiency.  The patient's hemodynamic recovery following valve deployment is good.  The deployment balloon and guidewire are both removed.    PROCEDURE COMPLETION:   The sheath was removed and femoral artery closure performed.  Protamine was administered once femoral arterial repair was complete. The pigtail catheter and femoral sheaths were removed with manual pressure used for venous hemostasis.  A Mynx femoral closure device was utilized following removal of the diagnostic sheath in the left femoral artery.  The patient tolerated the procedure well and is transported to the cath lab recovery area in stable condition. There were no immediate intraoperative complications. All sponge instrument and needle counts are verified correct at completion of the operation.   No blood products were administered during the operation.  The patient received a total of 60 mL of intravenous contrast during the procedure.   Dorise [name], MD 05/30/2024

## 2024-05-30 NOTE — Progress Notes (Signed)
 Site area: Right femoral Site Prior to Removal:  Level 0 Pressure Applied For: 15 minutes Manual:   Yes Patient Status During Pull:  Stable Post Pull Site:  Level 0 Post Pull Instructions Given:  Yes Post Pull Pulses Present: Bilateral Dp's palpable Dressing Applied:  gauze and tegaderm Bedrest begins @ 1600 Comments:

## 2024-05-30 NOTE — Discharge Summary (Addendum)
 [facility]   MULTIDISCIPLINARY HEART VALVE TEAM  Discharge Summary    Patient ID: Drew Adams MRN: [id number]; DOB: 07/05/1941  Admit date: 05/30/2024 Discharge date: 05/31/2024  PCP:  Stacia Millman, PA  [facility] Cardiologist:  [name], MD  [facility] Structural heart: Lurena MARLA Red, MD [facility] Electrophysiologist:  None   Discharge Diagnoses    Principal Problem:   S/P TAVR (transcatheter aortic valve replacement) Active Problems:   Iron deficiency anemia   Gout   CKD (chronic kidney disease) stage 2, GFR 60-89 ml/min   TIA (transient ischemic attack)   Thoracic aortic aneurysm (TAA)   Hypertension   Mixed hyperlipidemia   Mitral valve disease   Aortic valve disease   Allergies Allergies  Allergen Reactions   Crestor  [Rosuvastatin ] Other (See Comments)    Leg pain and cramping   Exforge [Amlodipine Besylate-Valsartan]    Simvastatin     Diagnostic Studies/Procedures    TAVR OPERATIVE NOTE     Date of Procedure:                05/30/2024   Preoperative Diagnosis:      Severe Aortic Stenosis    Postoperative Diagnosis:    Same    Procedure:        Transcatheter Aortic Valve Replacement - Percutaneous Right Transfemoral Approach             Edwards Sapien 3 Ultra Resilia THV (size 29 mm, model # 9755RSL, serial # [id number])              Co-Surgeons:                        Dorise LOIS Fellers, MD and Ozell Fell, MD  /  [name] / Lurena Red, MD     Anesthesiologist:                  Patrisha, MD   Echocardiographer:              Santo, MD   Pre-operative Echo Findings: Severe aortic stenosis Normal left ventricular systolic function   Post-operative Echo Findings: No paravalvular leak Normal left ventricular systolic function  _____________    Echo 05/31/24: completed but pending formal read at the time of discharge   History of Present Illness     Drew Adams is a 83  y.o. 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   His aortic stenosis had been followed by Dr. Dann. Echo in 2022 with moderate aortic stenosis, moderate aortic insufficiency and moderate mitral regurgitation. Ascending aorta dilated at 4.8 cm in 2022. Most recent CTA with 4.6 cm ascending aortic aneurysm. He has CKD, stage IIIb with most creatinine of 1.5 and GFR of 46. He has been followed in our CT surgery office by Dr. Fellers since January 2023. Echo 05/2024 with LVEF=55-60%, severe MAC with moderate MR and mild MS and severe aortic stenosis with a mean grad 26.8 mmHg, Vmax of 4.0 m/s, AVA 0.89 cm2 and mod-severe AI. He was seen on 04/19/24 by Dr. Fellers and reported dizziness, near syncope at home. He was referred to Dr. Cash and underwent coronary angiography on 05/03/24 which showed moderate disease in the mid LAD and diagonal branch, mild to moderate diffuse non-obstructive disease  in large dominant RCA and no obstructive disease in the Circumflex. Medical therapy was recommended.   The patient was evaluated by the multidisciplinary valve team and felt to have severe, symptomatic aortic stenosis and to be a suitable candidate for TAVR, which was set up for 05/30/24.   Hospital Course     Consultants: none   Aortic valve disease with mixed AS/AI: -- S/p successful TAVR with a 29 mm Edwards Sapien 3 Ultra Resilia THV via the TF approach on 05/30/24.  -- Post operative echo completed but pending formal read. -- Groin sites are stable.  -- Initial post op ECG with new LBBB but this regressed on ECG today. No HAVB on tele.  -- Started on Asprin 81mg  daily.  -- Met with cardiac rehab to discuss CRP phase II.  -- Plan for discharge home today with close follow up in the outpatient setting.   Acute on chronic diastolic CHF:   --  LVEDP noted to be 21 mm hg at the time of TAVR. -- Treated with one dose of IV lasix 20mg . -- He appears euvolemic. Hold further diuretics given AKI. -- BMET next week.   AKI in the setting of CKD stage IIIa: -- Creat baseline  1.26-1.51. -- Creat jumped to 1.3--> 1.96 this AM likely 2/2 contrast dye exposure and 20mg  IV lasix.  -- Check BMET next week in office.   Hypokalemia: -- Supplemented. K 3.4 at discharge.  -- Recheck next week.   Thrombocytopenia: -- PLTs dropped from 128--> 85 post op.  -- Likely reactive.    Thoracic aortic aneurysm: -- Known TAA with most recent CTA with 4.6 cm.  -- Plan to continue surveillance.    HTN: -- BP noted to be elevated but pt reports a long history of white coat HTN.  -- Spoke to daughter, [name], who will remind him to check BP at home and will bring in log to appt next week.    CAD: -- Cath on 05/03/24 which showed moderate disease in the mid LAD and Diagonal branch, mild to moderate diffuse non-obstructive disease in large dominant RCA and no obstructive disease in the Circumflex.  -- Medical therapy was recommended.  -- Statin intolerant.  Mitral valve disease:   -- Known severe MAC with moderate MR and mild MS. -- Continue to follow.     _____________  Discharge Vitals Blood pressure (!) 159/69, pulse 80, temperature 97.8 F (36.6 C), temperature source Oral, resp. rate 13, height 5' 10 (1.778 m), weight 67.1 kg, SpO2 95%.  Filed Weights   05/30/24 0903 05/31/24 0630  Weight: 64 kg 67.1 kg     GEN: Well nourished, well developed in no acute distress NECK: No JVD CARDIAC: RRR, no murmurs, rubs, gallops RESPIRATORY:  Clear to auscultation without rales, wheezing or rhonchi  ABDOMEN: Soft, non-tender, non-distended EXTREMITIES:  No edema; No deformity.  Groin sites clear without hematoma or ecchymosis.    Disposition   Pt is being discharged home today in good condition.  Follow-up Plans & Appointments      Follow-up Information     Goodrich, Callie E, PA-C. Go on 06/07/2024.   Specialty: Cardiology Why: @ 2:20pm, please arrive at least 20 minutes. Contact information: [address] [place] [address] [phone]                Discharge Instructions     Amb Referral to Cardiac Rehabilitation   Complete by: As directed    Diagnosis: Valve Replacement  Valve: Aortic   After initial evaluation and assessments completed: Virtual Based Care may be provided alone or in conjunction with Phase 2 Cardiac Rehab based on patient barriers.: Yes   Intensive Cardiac Rehabilitation (ICR) [facility] location only OR Traditional Cardiac Rehabilitation (TCR) *If criteria for ICR are not met will enroll in TCR [facility] only): Yes       Discharge Medications   Allergies as of 05/31/2024       Reactions   Crestor  [rosuvastatin ] Other (See Comments)   Leg pain and cramping   Exforge [amlodipine Besylate-valsartan]    Simvastatin         Medication List     TAKE these medications    allopurinol  300 MG tablet Commonly known as: ZYLOPRIM  Take 300 mg by mouth daily.   aspirin 81 MG chewable tablet Chew 1 tablet (81 mg total) by mouth daily. Start taking on: June 01, 2024   calcium  citrate 950 (200 Ca) MG tablet Commonly known as: CALCITRATE - dosed in mg elemental calcium  Take 600 mg by mouth daily.   cyanocobalamin 1000 MCG tablet Commonly known as: VITAMIN B12 Take 1,000 mcg by mouth daily.   finasteride 5 MG tablet Commonly known as: PROSCAR Take 5 mg by mouth daily.   hydrocortisone cream 1 % Apply 1 Application topically daily.   MAGNESIUM GLYCINATE PO Take 400 mg by mouth daily.   meclizine  25 MG tablet Commonly known as: ANTIVERT  Take 1 tablet (25 mg total) by mouth 3 (three) times daily as needed for dizziness.   multivitamin with minerals Tabs tablet Take 1 tablet by mouth daily. Mature   OVER THE COUNTER MEDICATION Take 1 tablet by mouth daily.  prostacor   predniSONE  5 MG tablet Commonly known as: DELTASONE  Take 5 mg by mouth daily as needed (Gout).   psyllium 58.6 % packet Commonly known as: METAMUCIL Take 1 packet by mouth daily. 10 ml   PURE L-ARGININE HCL PO Take 1,800 mg by mouth daily.   vitamin C  1000 MG tablet Take 1,000 mg by mouth daily.   Vitamin D3 250 MCG (10000 UT) capsule Take 10,000 Units by mouth daily. 10000units 3x per week, 5000units 4x per week.   Zinc  Gluconate 15 MG Tabs Take 50 mg by mouth daily. Plus copper 2 mg          Outstanding Labs/Studies   BMET  ______________________  Duration of Discharge Encounter: APP Time: 35 minutes    Signed, Lamarr Hummer, PA-C 05/31/2024, 10:26 AM [phone]

## 2024-05-30 NOTE — Progress Notes (Signed)
 Patient arrived in the unit, he is alert and oriented, V/S obtained, CCMD notified, CHG bath given, B/L groin site is C/D/I, no any pain/ discomfort, all needs met, call bell  in reach.    05/30/24 1642  Vitals  Temp 97.7 F (36.5 C)  Temp Source Oral  BP 121/61  MAP (mmHg) 79  BP Location Left Arm  BP Method Automatic  Patient Position (if appropriate) Lying  Pulse Rate (!) 49  Pulse Rate Source Monitor  ECG Heart Rate (!) 54  Resp 18  Level of Consciousness  Level of Consciousness Alert  MEWS COLOR  MEWS Score Color Green  Oxygen Therapy  SpO2 97 %  O2 Device Room Air  Pain Assessment  Pain Scale 0-10  Pain Score 0  MEWS Score  MEWS Temp 0  MEWS Systolic 0  MEWS Pulse 0  MEWS RR 0  MEWS LOC 0  MEWS Score 0

## 2024-05-30 NOTE — Discharge Instructions (Signed)

## 2024-05-30 NOTE — Op Note (Signed)
 [facility]  TAVR OPERATIVE NOTE   Date of Procedure:  05/30/2024  Preoperative Diagnosis: Severe Aortic Stenosis   Postoperative Diagnosis: Same   Procedure:   Transcatheter Aortic Valve Replacement - Transfemoral Approach  Edwards Sapien 3 Resilia THV (size 29 mm, model # 9755RLS, serial # [id number])   Co-Surgeons:  Dorise LOIS Fellers, MD and Lurena Red, MD Anesthesiologist:  Patrisha  Echocardiographer:  Santo  Pre-operative Echo Findings: Severe aortic stenosis Normal left ventricular systolic function  Post-operative Echo Findings: No paravalvular leak Unchanged left ventricular systolic function  Left Heart Catheterization Findings: Left ventricular end-diastolic pressure of   BRIEF CLINICAL NOTE AND INDICATIONS FOR SURGERY  The patient is an 83 year old male with a history of CKD stage IIIb, BPH, hypertension, thoracic ascending aneurysm, severe MAC with mild mitral stenosis and mitral regurgitation, hyperlipidemia, and severe symptomatic aortic valvular disease consisting of severe aortic stenosis and moderate to severe aortic insufficiency who is referred for an elective transcatheter aortic valve replacement from a right transfemoral approach with a 29 mm SAPIEN 3 valve.  During the course of the patient's preoperative work up they have been evaluated comprehensively by a multidisciplinary team of specialists coordinated through the Multidisciplinary Heart Valve Clinic in the [facility].  They have been demonstrated to suffer from symptomatic severe aortic stenosis as noted above. The patient has been counseled extensively as to the relative risks and benefits of all options for the treatment of severe aortic stenosis including long term medical therapy, conventional surgery for aortic valve replacement, and transcatheter aortic valve replacement.  The patient has been independently evaluated by Dr. Fellers with CT  surgery and they are felt to be at high risk for conventional surgical aortic valve replacement. The surgeon indicated the patient would be a poor candidate for conventional surgery. Based upon review of all of the patient's preoperative diagnostic tests they are felt to be candidate for transcatheter aortic valve replacement using the transfemoral approach as an alternative to high risk conventional surgery.    Following the decision to proceed with transcatheter aortic valve replacement, a discussion has been held regarding what types of management strategies would be attempted intraoperatively in the event of life-threatening complications, including whether or not the patient would be considered a candidate for the use of cardiopulmonary bypass and/or conversion to open sternotomy for attempted surgical intervention.  The patient has been advised of a variety of complications that might develop peculiar to this approach including but not limited to risks of death, stroke, paravalvular leak, aortic dissection or other major vascular complications, aortic annulus rupture, device embolization, cardiac rupture or perforation, acute myocardial infarction, arrhythmia, heart block or bradycardia requiring permanent pacemaker placement, congestive heart failure, respiratory failure, renal failure, pneumonia, infection, other late complications related to structural valve deterioration or migration, or other complications that might ultimately cause a temporary or permanent loss of functional independence or other long term morbidity.  The patient provides full informed consent for the procedure as described and all questions were answered preoperatively.    DETAILS OF THE OPERATIVE PROCEDURE  PREPARATION:   The patient is brought to the operating room on the above mentioned date and central monitoring was established by the anesthesia team. The patient is placed in the supine position on the operating table.   Intravenous antibiotics are administered. Conscious sedation is used.   Baseline transthoracic echocardiogram was performed. The patient's chest, abdomen, both groins, and both lower extremities are prepared and  draped in a sterile manner. A time out procedure is performed.   PERIPHERAL ACCESS:   Using the modified Seldinger technique, femoral arterial and venous access were obtained with placement of a 6 Fr sheath in the left common femoral artery and a 6 Fr sheath in the right femoral vein using u/s guidance.  A pigtail diagnostic catheter was passed through the femoral arterial sheath under fluoroscopic guidance into the aortic root.  Aortic root angiography was performed in order to determine the optimal angiographic angle for valve deployment.  TRANSFEMORAL ACCESS:  A micropuncture kit was used to gain access to the right common femoral artery using u/s guidance. Position confirmed with angiography. Pre-closure with double ProGlide closure devices. The patient was heparinized systemically and ACT verified > 250 seconds.    A 16 Fr transfemoral E-sheath was introduced into the right common femoral artery after progressively dilating over an Amplatz superstiff wire. An AL-2 catheter was used to direct a straight-tip exchange length wire across the native aortic valve into the left ventricle. This was exchanged out for a pigtail catheter and position was confirmed in the LV apex. Simultaneous left ventricular, aortic, and left ventricular end-diastolic pressures were recorded.  The pigtail catheter was then exchanged for an Safari wire in the LV apex.  Direct LV pacing thresholds were assessed and found to be adequate.   TRANSCATHETER HEART VALVE DEPLOYMENT:  An Edwards Sapien 3 THV (size 29 mm) was prepared and crimped per manufacturer's guidelines, and the proper orientation of the valve is confirmed on the [employer] delivery system. The valve was advanced through the introducer sheath  using normal technique until in an appropriate position in the abdominal aorta beyond the sheath tip. The balloon was then retracted and using the fine-tuning wheel was centered on the valve. The valve was then advanced across the aortic arch using appropriate flexion of the catheter. The valve was carefully positioned across the aortic valve annulus. The Commander catheter was retracted using normal technique. Once final position of the valve has been confirmed by angiographic assessment, the valve is deployed while temporarily holding ventilation and during rapid ventricular pacing to maintain systolic blood pressure < 50 mmHg and pulse pressure < 10 mmHg. The balloon inflation is held for >3 seconds after reaching full deployment volume. Once the balloon has fully deflated the balloon is retracted into the ascending aorta and valve function is assessed using TTE. There is felt to be no paravalvular leak and no central aortic insufficiency.  The patient's hemodynamic recovery following valve deployment is good.  The deployment balloon and guidewire are both removed. Echo demostrated acceptable post-procedural gradients, stable mitral valve function, and no AI.   PROCEDURE COMPLETION:  The sheath was then removed and closure devices were completed. Protamine was administered once femoral arterial repair was complete. The pigtail catheters and femoral sheaths were removed with  a Mynx closure device placed in the artery and manual pressure used for venous hemostasis.    The patient tolerated the procedure well and is transported to the surgical intensive care in stable condition. There were no immediate intraoperative complications. All sponge instrument and needle counts are verified correct at completion of the operation.   No blood products were administered during the operation.  The patient received a total of 60 mL of intravenous contrast during the procedure.  Rilda Bulls K Glynn Freas MD 05/30/2024 3:25  PM

## 2024-05-30 NOTE — Interval H&P Note (Signed)
 History and Physical Interval Note:  05/30/2024 12:50 PM  Drew Adams  has presented today for surgery, with the diagnosis of Severe Aortic Stenosis.  The various methods of treatment have been discussed with the patient and family. After consideration of risks, benefits and other options for treatment, the patient has consented to  Procedure(s): Transcatheter Aortic Valve Replacement, Transfemoral (N/A) ECHOCARDIOGRAM, TRANSTHORACIC (N/A) as a surgical intervention.  The patient's history has been reviewed, patient examined, no change in status, stable for surgery.  I have reviewed the patient's chart and labs.  Questions were answered to the patient's satisfaction.     Kennard Fildes K Trish Mancinelli

## 2024-05-30 NOTE — Progress Notes (Signed)
  [facility] VASCULAR CENTER   MULTIDISCIPLINARY HEART VALVE TEAM  Patient doing well s/p TAVR. He is hemodynamically stable. Groin sites stable. ECG with new ILBBB but no high grade block. Transferred from cath lab holding to 4E. Potassium 3.3. LVEDP 21 mm hg at the time of TAVR. Will treat with one dose of IV Lasix 20mg  and 40 meq KDur. Early ambulation after bedrest completed and hopeful discharge over the next 24-48 hours.   Lamarr Hummer PA-C  [facility]  Pager [phone]

## 2024-05-30 NOTE — Transfer of Care (Signed)
 Immediate Anesthesia Transfer of Care Note  Patient: Drew Adams  Procedure(s) Performed: Transcatheter Aortic Valve Replacement, Transfemoral ECHOCARDIOGRAM, TRANSTHORACIC  Patient Location: Cath Lab  Anesthesia Type:MAC  Level of Consciousness: awake and alert   Airway & Oxygen Therapy: Patient Spontanous Breathing and Patient connected to face mask oxygen  Post-op Assessment: Report given to RN and Post -op Vital signs reviewed and stable  Post vital signs: Reviewed and stable  Last Vitals:  Vitals Value Taken Time  BP 119/51 05/30/24 15:17  Temp    Pulse 65 05/30/24 15:21  Resp 15 05/30/24 15:21  SpO2 100 % 05/30/24 15:21  Vitals shown include unfiled device data.  Last Pain:  Vitals:   05/30/24 1506  TempSrc:   PainSc: Asleep         Complications: There were no known notable events for this encounter.

## 2024-05-31 ENCOUNTER — Encounter (HOSPITAL_COMMUNITY): Payer: Self-pay | Admitting: Internal Medicine

## 2024-05-31 ENCOUNTER — Inpatient Hospital Stay (HOSPITAL_COMMUNITY)

## 2024-05-31 DIAGNOSIS — Z952 Presence of prosthetic heart valve: Secondary | ICD-10-CM

## 2024-05-31 LAB — CBC
HCT: 38.2 % — ABNORMAL LOW (ref 39.0–52.0)
Hemoglobin: 12.6 g/dL — ABNORMAL LOW (ref 13.0–17.0)
MCH: 30.9 pg (ref 26.0–34.0)
MCHC: 33 g/dL (ref 30.0–36.0)
MCV: 93.6 fL (ref 80.0–100.0)
Platelets: 85 K/uL — ABNORMAL LOW (ref 150–400)
RBC: 4.08 MIL/uL — ABNORMAL LOW (ref 4.22–5.81)
RDW: 15.5 % (ref 11.5–15.5)
WBC: 10.3 K/uL (ref 4.0–10.5)
nRBC: 0 % (ref 0.0–0.2)

## 2024-05-31 LAB — ECHOCARDIOGRAM COMPLETE
AR max vel: 1.95 cm2
AV Area VTI: 2.12 cm2
AV Area mean vel: 2.14 cm2
AV Mean grad: 10 mmHg
AV Peak grad: 23.8 mmHg
Ao pk vel: 2.44 m/s
Area-P 1/2: 5.7 cm2
Height: 70 in
MV VTI: 2.66 cm2
S' Lateral: 2.5 cm
Weight: 2366.86 [oz_av]

## 2024-05-31 LAB — BASIC METABOLIC PANEL WITH GFR
Anion gap: 15 (ref 5–15)
BUN: 31 mg/dL — ABNORMAL HIGH (ref 8–23)
CO2: 22 mmol/L (ref 22–32)
Calcium: 9 mg/dL (ref 8.9–10.3)
Chloride: 106 mmol/L (ref 98–111)
Creatinine, Ser: 1.96 mg/dL — ABNORMAL HIGH (ref 0.61–1.24)
GFR, Estimated: 34 mL/min — ABNORMAL LOW (ref >60)
Glucose, Bld: 126 mg/dL — ABNORMAL HIGH (ref 70–99)
Potassium: 3.4 mmol/L — ABNORMAL LOW (ref 3.5–5.1)
Sodium: 143 mmol/L (ref 135–145)

## 2024-05-31 LAB — GLUCOSE, CAPILLARY
Glucose-Capillary: 108 mg/dL — ABNORMAL HIGH (ref 70–99)
Glucose-Capillary: 110 mg/dL — ABNORMAL HIGH (ref 70–99)

## 2024-05-31 LAB — MAGNESIUM: Magnesium: 1.9 mg/dL (ref 1.7–2.4)

## 2024-05-31 MED ORDER — POTASSIUM CHLORIDE CRYS ER 20 MEQ PO TBCR
20.0000 meq | EXTENDED_RELEASE_TABLET | Freq: Once | ORAL | Status: AC
  Administered 2024-05-31: 20 meq via ORAL
  Filled 2024-05-31: qty 1

## 2024-05-31 MED ORDER — ASPIRIN 81 MG PO CHEW: 81.0000 mg | CHEWABLE_TABLET | Freq: Every day | ORAL | Status: AC

## 2024-05-31 NOTE — Progress Notes (Signed)
 CARDIAC REHAB PHASE I   PRE:  Rate/Rhythm: 84 NSR  BP:  Sitting: 142/57      SpO2: 95 RA  MODE:  Ambulation: 160 ft    POST:  Rate/Rhythm: 98 NSR  BP:  Sitting: 158/91      SpO2: 95 RA  Pt ambulated with standby assistance in the hallway with RW, pt tolerated exercise well w/o symptoms. Pt was returned to room and educated on exercise, diet, and CRP2. Left materials in room, pt unsure if interested. Will refer to [facility].   Garen FORBES Candy  MS, ACSM-CEP 9:05 AM 05/31/2024    Service time is from 0843 to 0905.

## 2024-05-31 NOTE — Progress Notes (Signed)
 AVS reviewed with patient and daughter about medications, activity restrictions, and incision care. All questions were answered. Patient and daughter verbalized understanding of instructions. IV and tele were removed without difficulty. Patient will discharge home to care of family. Patient was transported in wheelchair downstairs to private vehicle.  Jacquline Cooter BSN RN

## 2024-05-31 NOTE — Progress Notes (Signed)
 1 Day Post-Op Procedure(s) (LRB): Transcatheter Aortic Valve Replacement, Transfemoral (N/A) ECHOCARDIOGRAM, TRANSTHORACIC (N/A) Subjective: No complaints. No heart block or bradycardia on monitor.  Objective: Vital signs in last 24 hours: Temp:  [97.7 F (36.5 C)-98.5 F (36.9 C)] 97.8 F (36.6 C) (11/05 0745) Pulse Rate:  [49-84] 80 (11/05 0745) Cardiac Rhythm: Normal sinus rhythm (11/05 0710) Resp:  [7-20] 13 (11/05 0745) BP: (101-175)/(39-89) 159/69 (11/05 0745) SpO2:  [92 %-100 %] 95 % (11/05 0745) Weight:  [64 kg-67.1 kg] 67.1 kg (11/05 0630)  Hemodynamic parameters for last 24 hours:    Intake/Output from previous day: 11/04 0701 - 11/05 0700 In: 1240 [P.O.:240; I.V.:800; IV Piggyback:200] Out: 465 [Urine:450; Blood:15] Intake/Output this shift: No intake/output data recorded.  General appearance: alert and cooperative Neurologic: intact Heart: regular rate and rhythm Lungs: clear to auscultation bilaterally Extremities: warm and pink Wound: groin sites ok  Lab Results: Recent Labs    05/30/24 1503 05/31/24 0412  WBC  --  10.3  HGB 10.5* 12.6*  HCT 31.0* 38.2*  PLT  --  85*   BMET:  Recent Labs    05/30/24 1503 05/31/24 0412  NA 145 143  K 3.3* 3.4*  CL 108 106  CO2  --  22  GLUCOSE 93 126*  BUN 24* 31*  CREATININE 1.10 1.96*  CALCIUM   --  9.0    PT/INR: No results for input(s): LABPROT, INR in the last 72 hours. ABG    Component Value Date/Time   TCO2 22 05/30/2024 1503   CBG (last 3)  Recent Labs    05/30/24 2111 05/31/24 0627  GLUCAP 69* 108*   ECG: NSR, no heart block  Assessment/Plan: S/P Procedure(s) (LRB): Transcatheter Aortic Valve Replacement, Transfemoral (N/A) ECHOCARDIOGRAM, TRANSTHORACIC (N/A)  POD1 Hemodynamically stable with tendency to HTN on no antihypertensives preop. I think this can be followed up as outpt.  Bump in creat to 1.96 this am. Baseline is about 1.3. This should come back down over the next  few days.   K+ 3.4. will give 20 meq this am.  2D echo this am.  Ambulate and plan home later today.   LOS: 1 day    Drew Adams Drew Adams 05/31/2024

## 2024-05-31 NOTE — Anesthesia Postprocedure Evaluation (Signed)
 Anesthesia Post Note  Patient: Giovani Neumeister  Procedure(s) Performed: Transcatheter Aortic Valve Replacement, Transfemoral ECHOCARDIOGRAM, TRANSTHORACIC     Patient location during evaluation: Cath Lab Anesthesia Type: MAC Level of consciousness: awake Pain management: pain level controlled Vital Signs Assessment: post-procedure vital signs reviewed and stable Respiratory status: spontaneous breathing, nonlabored ventilation and respiratory function stable Cardiovascular status: blood pressure returned to baseline and stable Postop Assessment: no apparent nausea or vomiting Anesthetic complications: no   There were no known notable events for this encounter.  Last Vitals:  Vitals:   05/31/24 0745 05/31/24 1055  BP: (!) 159/69 (!) 122/59  Pulse: 80 (!) 54  Resp: 13 18  Temp: 36.6 C 36.9 C  SpO2: 95% 97%    Last Pain:  Vitals:   05/31/24 1055  TempSrc: Oral  PainSc:                  Coralee Edberg P Zyra Parrillo

## 2024-05-31 NOTE — Plan of Care (Signed)

## 2024-05-31 NOTE — Progress Notes (Signed)
 Mobility Specialist Progress Note:    05/31/24 1034  Mobility  Activity Ambulated with assistance  Level of Assistance Contact guard assist, steadying assist  Assistive Device None  Distance Ambulated (ft) 200 ft  Activity Response Tolerated well  Mobility Referral Yes  Mobility visit 1 Mobility  Mobility Specialist Start Time (ACUTE ONLY) 1034  Mobility Specialist Stop Time (ACUTE ONLY) 1049  Mobility Specialist Time Calculation (min) (ACUTE ONLY) 15 min   Pt received in bed, pleasantly confused. Required encouragement to participate. Ambulated in hallway with MinG using gait belt d/t unsteadiness. Tolerated well, eager to d/c. Returned to bed left with all needs met, call bell in reach. Bed alarm on.    Jahmiya Guidotti Mobility Specialist Please contact via Special Educational Needs Teacher or  Rehab office at [phone]

## 2024-06-01 ENCOUNTER — Telehealth: Payer: Self-pay

## 2024-06-01 NOTE — Telephone Encounter (Signed)
 I left a message for the patient to call back regard his TOC s/p TAVR

## 2024-06-02 NOTE — Telephone Encounter (Signed)
 TOC Call Attempt #2, left message on pt's voicemail with 11/12 appointment information.

## 2024-06-05 ENCOUNTER — Telehealth (HOSPITAL_COMMUNITY): Payer: Self-pay

## 2024-06-05 NOTE — Telephone Encounter (Signed)
Pt is unable to do the cardiac rehab program at this time.  Closed referral.

## 2024-06-06 ENCOUNTER — Encounter: Payer: Self-pay | Admitting: *Deleted

## 2024-06-06 DIAGNOSIS — I5032 Chronic diastolic (congestive) heart failure: Secondary | ICD-10-CM | POA: Insufficient documentation

## 2024-06-06 DIAGNOSIS — I2510 Atherosclerotic heart disease of native coronary artery without angina pectoris: Secondary | ICD-10-CM | POA: Insufficient documentation

## 2024-06-06 DIAGNOSIS — I350 Nonrheumatic aortic (valve) stenosis: Secondary | ICD-10-CM | POA: Insufficient documentation

## 2024-06-06 NOTE — Assessment & Plan Note (Deleted)
 S/p TAVR 05/30/24. Post op TTE with mild PVL. He had transient Left Bundle Branch Block but this resolved. 30 day TTE scheduled for 12/1. ***

## 2024-06-06 NOTE — Assessment & Plan Note (Deleted)
 Pt developed mild AKI with diuresis post op. *** - BMET***

## 2024-06-06 NOTE — Assessment & Plan Note (Deleted)
 Mod nonobs CAD by cardiac catheterization.***

## 2024-06-06 NOTE — Progress Notes (Deleted)
 OFFICE NOTE:    Date:  06/06/2024  ID:  Drew Adams, DOB 1940-09-16, MRN [id number] PCP: Stacia Millman, PA  [facility] Providers Cardiologist:  [name], MD Structural Heart:  Lurena MARLA Red, MD{ Click to update primary MD,subspecialty MD or APP then REFRESH:1}       Nonobstructive coronary artery disease  LHC 05/03/2024: LAD mid 65, D1 60; LCx mid 30; RCA proximal 30, distal 30, RPDA 30 Severe aortic stenosis  Mod to severe aortic insufficiency  S/p TAVR 05/30/24 Limited TTE 05/30/2024: EF 60-65, normal RVSF, mild-moderate MR, TAVR with trivial PVL TTE 05/31/2024: EF 55-60, no RWMA, moderate LVH, GR 1 DD, normal RVSF, severe LAE, mild MR, no MS, mean MV gradient 4, s/p TAVR with mild PVL, mean gradient 10, ascending aorta 43 mm Mod mitral regurgitation, mild mitral stenosis  (HFpEF) heart failure with preserved ejection fraction  Ascending thoracic aortic aneurysm Chest CTA 04/12/2024: 4.6 x 4.5 cm Hypertension Hyperlipidemia  Chronic kidney disease  Iron deficiency anemia  Thrombocytopenia         Discussed the use of AI scribe software for clinical note transcription with the patient, who gave verbal consent to proceed. History of Present Illness Drew Adams is a 83 y.o. male for post hospital follow up.   Patient was admitted 11/4-11/5.  He underwent successful TAVR with a 29 mm Edwards Sapien 3 Ultra Resilia THV via the TF approach on 05/30/24. POD1 Echo showed mild perivalvular regurgitation but no aortic stenosis (mean gradient 10.0 mmHg). Initial post-op EKG showed a new LBBB but this resolved on repeat tracings and he had no AV block noted on telemetry. He did receive one dose of IV Lasix due to LVEDP of 21 mmHg during procedure but developed AKI with this. He was started on Aspirin. Plt dropped post op from 128K to 85K and was felt to be reactive.     ROS-See HPI***    Studies Reviewed:      *** Results  Risk  Assessment/Calculations: {Does this patient have ATRIAL FIBRILLATION?:[phone]} No BP recorded.  {Refresh Note OR Click here to enter BP  :1}***      Physical Exam:  VS:  There were no vitals taken for this visit.       Wt Readings from Last 3 Encounters:  05/31/24 147 lb 14.9 oz (67.1 kg)  05/18/24 138 lb (62.6 kg)  05/03/24 138 lb (62.6 kg)    Physical Exam***     Assessment and Plan:    Assessment & Plan Aortic stenosis, severe S/P TAVR (transcatheter aortic valve replacement) S/p TAVR 05/30/24. Post op TTE with mild PVL. He had transient Left Bundle Branch Block but this resolved. 30 day TTE scheduled for 12/1. *** Chronic heart failure with preserved ejection fraction (HFpEF) (HCC) Pt had elevated LVEDP periop and was given IV Lasix. He had AKI and diuretics were held at DC. *** - BMET*** CKD (chronic kidney disease) stage 2, GFR 60-89 ml/min Pt developed mild AKI with diuresis post op. *** - BMET*** Thrombocytopenia Plt count dropped post op.*** - CBC *** Coronary artery disease involving native coronary artery of native heart without angina pectoris Mod nonobs CAD by cardiac catheterization.*** Assessment and Plan Assessment & Plan    {      :1}    {Are you ordering a CV Procedure (e.g. stress test, cath, DCCV, TEE, etc)?   Press F2        :[id number]}  Dispo:  No follow-ups on file.  Signed, Glendia Ferrier, PA-C

## 2024-06-06 NOTE — Assessment & Plan Note (Deleted)
 Pt had elevated LVEDP periop and was given IV Lasix. He had AKI and diuretics were held at DC. *** - BMET***

## 2024-06-07 ENCOUNTER — Ambulatory Visit: Admitting: Physician Assistant

## 2024-06-07 ENCOUNTER — Ambulatory Visit: Admitting: Student

## 2024-06-07 ENCOUNTER — Telehealth: Payer: Self-pay | Admitting: Cardiovascular Disease

## 2024-06-07 DIAGNOSIS — N182 Chronic kidney disease, stage 2 (mild): Secondary | ICD-10-CM

## 2024-06-07 DIAGNOSIS — D696 Thrombocytopenia, unspecified: Secondary | ICD-10-CM

## 2024-06-07 DIAGNOSIS — I2510 Atherosclerotic heart disease of native coronary artery without angina pectoris: Secondary | ICD-10-CM

## 2024-06-07 DIAGNOSIS — I350 Nonrheumatic aortic (valve) stenosis: Secondary | ICD-10-CM

## 2024-06-07 DIAGNOSIS — I5032 Chronic diastolic (congestive) heart failure: Secondary | ICD-10-CM

## 2024-06-07 DIAGNOSIS — Z952 Presence of prosthetic heart valve: Secondary | ICD-10-CM

## 2024-06-07 NOTE — Telephone Encounter (Signed)
 RN spoke to daughter. She states she was in [place]. She called him this morning . He indicated he canceled appt because he was to weak.  Rn informed Eva the his bowels may have changed due to having anesthesia for surgery . She stated he did not  mention that he  has a fever when the RN asked the question .     Daughter tried to contact patient by 3-way call. He did not answer. She states he does not answer the phone all the time. She will continue to try or contact a  neighbor.   Daughter does not have any vital signs to give, She was concerned. RN informed Daughter patient would need to come to the appt.tomorrow. Office will not be able to evaluate him without him being seen in the office.  RN recommend if he not able to come to the office he should be evaluated at the ER.  Eva ( daughter)  states she  will relay the message. Also she will contact her sister who lives In [place] , to see if she could come to the appt with the patient.   She voiced understanding   Try to keep the appt for 06/08/24  at 9:30 am , She could call in  the morning to cancel if need be - but he would ned to be  evaluated by someone.   Will send message also  to TAVR Navigator

## 2024-06-07 NOTE — Progress Notes (Signed)
 Cardiology Office Note:  .   Date:  06/08/2024  ID:  Drew Adams, DOB [birth year], MRN [id number] PCP: Stacia Millman, PA   HeartCare Providers Cardiologist:  [name], MD Structural Heart:  Arun K Thukkani, MD{  History of Present Illness: .   Drew Adams is a 83 y.o. male with history of non-obstructive CAD noted on cardiac catheterization in 04/2024, severe aortic stenosis/ moderate to severe aortic insufficiency s/p TAVR on 05/30/2024, moderate mitral regurgitation/ mild mitral stenosis, ascending thoracic aortic aneurysm,  hypertension, hyperlipidemia, iron deficiency anemia,  BPH, and migraines     Aortic stenosis Documented since 2021 03/2024 had progression to severe aortic stenosis with moderate to severe aortic insufficiency.  Coronary CTA with aneurysm of the tubular ascending thoracic aorta 4.6 x 4.5 cm 04/2024 preop LHC moderate non-obstructive disease in the mid LAD and Diag branch and mild to moderate diffuse disease in the a large dominant RCA. Medical therapy was recommended. 05/2024 successful TAVR with a 29 mm Edwards Sapien 3 Ultra Resilia THV via the TF approach. Mild perivalvular regurgitation on echo.   Social history  No history of tobacco, alcohol, drug use. Widowed Daughter and son-in-law take care of him      Patient with history of progressive aortic stenosis and recently just underwent successful TAVR with a 29 mm Edwards Sapien 3 Ultra Resilia THV via the TF approach on 05/30/24. POD1 Echo showed mild perivalvular regurgitation but no aortic stenosis (mean gradient 10.0 mmHg). Initial post-op EKG showed a new LBBB but this resolved on repeat tracings and he had no AV block noted on telemetry. He did received one dose of IV Lasix due to LVEDP of 21 mmHg during procedure but developed AKI with this. He was started on Aspirin. Plt dropped post op from 128K to 85K and was felt to be reactive.   Today patient presents for TAVR follow-up.   He is accompanied with his son-in-law.  Daughter and other relative on the phone.  Overall he has feels that he has done well, has been reporting a little bit of fatigue and said he did not have a bowel movement for about 1 week.  Also did report some episodes of urinary incontinence but denied any blood in his urine, pain, decreased urine stream, dribbling.  Denied any fevers.  Prior to his hospitalization he was very active and would do a 2 mile run walk and is hoping to get back to that fitness level.  Daughter had some concerns of depression.  His wife had died 1 year ago.         ROS: Denies: Chest pain, shortness of breath, orthopnea, peripheral edema, palpitations, decreased exercise intolerance, fatigue, lightheadedness, syncope.   Studies Reviewed: SABRA    EKG Interpretation Date/Time:  Thursday June 08 2024 09:30:03 EST Ventricular Rate:  82 PR Interval:  170 QRS Duration:  94 QT Interval:  376 QTC Calculation: 439 R Axis:   -27  Text Interpretation: Normal sinus rhythm Moderate voltage criteria for LVH, may be normal variant ( R in aVL , Cornell product ) When compared with ECG of 31-May-2024 04:23, No significant change was found Confirmed by Darryle Currier [phone]) on 06/08/2024 10:04:22 AM    Risk Assessment/Calculations:             Physical Exam:   VS:  BP 132/70   Pulse 82   Ht 5' 10 (1.778 m)   Wt 142 lb (64.4 kg)   SpO2 98%   BMI 20.37  kg/m    Wt Readings from Last 3 Encounters:  06/08/24 142 lb (64.4 kg)  05/31/24 147 lb 14.9 oz (67.1 kg)  05/18/24 138 lb (62.6 kg)    GEN: Well nourished, well developed in no acute distress NECK: No JVD; No carotid bruits CARDIAC: Distant heart sounds, RRR, 2 out of 6 murmur at the apex. RESPIRATORY:  Clear to auscultation without rales, wheezing or rhonchi  ABDOMEN: Soft, non-tender, non-distended EXTREMITIES:  No edema; No deformity   ASSESSMENT AND PLAN: .    Severe Aortic Stenosis/ Moderate to Severe Aortic  Insufficiency s/p TAVR -S/p successful TAVR on 05/30/2024 Overall seems to be doing well.  He has had mild complaints of fatigue which is expected to some degree as he is only 1 week out from his operation.  Valve function was stable on inpatient echocardiogram and no significant murmurs on physical exam to suggest valve complications.   Continue Aspirin 81mg  daily.  Will need SBE prophylaxis prior to any dental cleaning/ procedures in the future. Will provide prescription of Amoxicillin 2,000mg  for him to take 30-60 minutes prior to dental work.  Post-op Echo scheduled for 12/01.  Nonobstructive CAD  HLD Noted on recent cardiac catheterization in 04/2024 prior to TAVR.  No anginal complaints. Continue Asprin 81mg  daily. Intolerant to statins in the past.  Would let him recover more before considering adjunctive therapies.  Readdress next appointment.  Ascending Thoracic Aortic Aneurysm  CTA in 03/2024 showed an aneurysm of the tubular ascending thoracic aorta measuring 4.6 x 4.5cm.  No history of aortopathy's to indicate sooner intervention. Family reports mother also had aortic aneurysm but no evidence of dissection. Continue annual monitoring with CT  Mitral Valve Disease Echocardiogram 05/2024 with EF 55 to 60%, severely dilated LA.  Posterior leaflet restriction, degenerative MV with mild MR.  Gradient 4.  Severe MAC. No MS.  Hypertension Well-controlled today 132/70.  He is not on any blood pressure medications right now.    CKD Stage IIIa Baseline creatinine around 1.3 to 1.5. Most recent creatinine 1.96 on recent discharge.  Will repeat BMET today.   Thrombocytopenia Platelets dropped from 128 to 85 after recent TAVR. This was felt to likely be reactive.  Will repeat CBC today.   Urinary incontinence Will order UA, CBC with differential to ensure no UTI.    Dispo: Follow-up with Izetta Hummer 12/1  Signed, Thom LITTIE Sluder, PA-C

## 2024-06-07 NOTE — Telephone Encounter (Signed)
 Py's daughter would like a c/b in regards to pt being very weak. Daughter states that pt had TAVR done on 11/4, since pt has not only been weak but sleeping more and experiencing incontinence. She says that pt had first bowel movement yesterday since 11/4. Pt's appt for today had to be cancelled due to these concerns. Please advise

## 2024-06-08 ENCOUNTER — Ambulatory Visit: Attending: Cardiology | Admitting: Cardiology

## 2024-06-08 ENCOUNTER — Encounter: Payer: Self-pay | Admitting: Cardiology

## 2024-06-08 ENCOUNTER — Telehealth: Payer: Self-pay

## 2024-06-08 VITALS — BP 132/70 | HR 82 | Ht 70.0 in | Wt 142.0 lb

## 2024-06-08 DIAGNOSIS — I349 Nonrheumatic mitral valve disorder, unspecified: Secondary | ICD-10-CM | POA: Diagnosis not present

## 2024-06-08 DIAGNOSIS — D696 Thrombocytopenia, unspecified: Secondary | ICD-10-CM | POA: Diagnosis not present

## 2024-06-08 DIAGNOSIS — I059 Rheumatic mitral valve disease, unspecified: Secondary | ICD-10-CM

## 2024-06-08 DIAGNOSIS — R32 Unspecified urinary incontinence: Secondary | ICD-10-CM | POA: Diagnosis not present

## 2024-06-08 DIAGNOSIS — I712 Thoracic aortic aneurysm, without rupture, unspecified: Secondary | ICD-10-CM

## 2024-06-08 DIAGNOSIS — Z952 Presence of prosthetic heart valve: Secondary | ICD-10-CM | POA: Diagnosis not present

## 2024-06-08 DIAGNOSIS — I2584 Coronary atherosclerosis due to calcified coronary lesion: Secondary | ICD-10-CM | POA: Diagnosis not present

## 2024-06-08 DIAGNOSIS — I2510 Atherosclerotic heart disease of native coronary artery without angina pectoris: Secondary | ICD-10-CM | POA: Diagnosis not present

## 2024-06-08 MED ORDER — AMOXICILLIN 500 MG PO CAPS: ORAL_CAPSULE | ORAL | 2 refills | Status: DC

## 2024-06-08 NOTE — Telephone Encounter (Signed)
 The patient was notified that he needed a BMP lab test done.  The patient stated that he would speak to his son-in-law and see if he could bring him back this week to do the lab work.

## 2024-06-08 NOTE — Addendum Note (Signed)
 Addended by: Jimmye Wisnieski on: 06/08/2024 01:31 PM   Modules accepted: Orders

## 2024-06-08 NOTE — Patient Instructions (Signed)
 Medication Instructions:  Amoxicillin 2 grams 30 minutes to 1 hour prior to dental procedures.  *If you need a refill on your cardiac medications before your next appointment, please call your pharmacy*  Lab Work: Today: Urinalysis, CBC W / diff  If you have labs (blood work) drawn today and your tests are completely normal, you will receive your results only by: MyChart Message (if you have MyChart) OR A paper copy in the mail If you have any lab test that is abnormal or we need to change your treatment, we will call you to review the results.  Testing/Procedures: None   Follow-Up: At [facility], you and your health needs are our priority.  As part of our continuing mission to provide you with exceptional heart care, our providers are all part of one team.  This team includes your primary Cardiologist (physician) and Advanced Practice Providers or APPs (Physician Assistants and Nurse Practitioners) who all work together to provide you with the care you need, when you need it.  Your next appointment:   As Scheduled    Provider:   Lamarr Hummer, PA    We recommend signing up for the patient portal called MyChart.  Sign up information is provided on this After Visit Summary.  MyChart is used to connect with patients for Virtual Visits (Telemedicine).  Patients are able to view lab/test results, encounter notes, upcoming appointments, etc.  Non-urgent messages can be sent to your provider as well.   To learn more about what you can do with MyChart, go to [email or web address].   Other Instructions None

## 2024-06-09 ENCOUNTER — Ambulatory Visit: Payer: Self-pay | Admitting: Cardiology

## 2024-06-09 LAB — URINALYSIS

## 2024-06-10 DIAGNOSIS — R4182 Altered mental status, unspecified: Secondary | ICD-10-CM | POA: Diagnosis not present

## 2024-06-10 DIAGNOSIS — N390 Urinary tract infection, site not specified: Secondary | ICD-10-CM | POA: Diagnosis not present

## 2024-06-10 LAB — CBC WITH DIFFERENTIAL/PLATELET
Basophils Absolute: 0 x10E3/uL (ref 0.0–0.2)
Basos: 0 %
EOS (ABSOLUTE): 0.1 x10E3/uL (ref 0.0–0.4)
Eos: 1 %
Hematocrit: 31.4 % — ABNORMAL LOW (ref 37.5–51.0)
Hemoglobin: 10.2 g/dL — ABNORMAL LOW (ref 13.0–17.7)
Immature Grans (Abs): 0.1 x10E3/uL (ref 0.0–0.1)
Immature Granulocytes: 1 %
Lymphocytes Absolute: 0.8 x10E3/uL (ref 0.7–3.1)
Lymphs: 6 %
MCH: 30.1 pg (ref 26.6–33.0)
MCHC: 32.5 g/dL (ref 31.5–35.7)
MCV: 93 fL (ref 79–97)
Monocytes Absolute: 1.9 x10E3/uL — ABNORMAL HIGH (ref 0.1–0.9)
Monocytes: 14 %
Neutrophils Absolute: 10.1 x10E3/uL — ABNORMAL HIGH (ref 1.4–7.0)
Neutrophils: 78 %
Platelets: 167 x10E3/uL (ref 150–450)
RBC: 3.39 x10E6/uL — ABNORMAL LOW (ref 4.14–5.80)
RDW: 14.3 % (ref 11.6–15.4)
WBC: 13 x10E3/uL — ABNORMAL HIGH (ref 3.4–10.8)

## 2024-06-10 LAB — URINALYSIS

## 2024-06-11 ENCOUNTER — Inpatient Hospital Stay (HOSPITAL_COMMUNITY)
Admission: EM | Admit: 2024-06-11 | Discharge: 2024-06-16 | DRG: 683 | Disposition: A | Discharge status: Home Health | Attending: Internal Medicine | Admitting: Internal Medicine

## 2024-06-11 ENCOUNTER — Other Ambulatory Visit: Payer: Self-pay

## 2024-06-11 ENCOUNTER — Emergency Department (HOSPITAL_COMMUNITY)

## 2024-06-11 ENCOUNTER — Encounter (HOSPITAL_COMMUNITY): Payer: Self-pay

## 2024-06-11 DIAGNOSIS — A419 Sepsis, unspecified organism: Secondary | ICD-10-CM | POA: Diagnosis not present

## 2024-06-11 DIAGNOSIS — Z7982 Long term (current) use of aspirin: Secondary | ICD-10-CM

## 2024-06-11 DIAGNOSIS — Z87442 Personal history of urinary calculi: Secondary | ICD-10-CM

## 2024-06-11 DIAGNOSIS — R310 Gross hematuria: Secondary | ICD-10-CM | POA: Diagnosis not present

## 2024-06-11 DIAGNOSIS — I712 Thoracic aortic aneurysm, without rupture, unspecified: Secondary | ICD-10-CM | POA: Diagnosis present

## 2024-06-11 DIAGNOSIS — N1831 Chronic kidney disease, stage 3a: Secondary | ICD-10-CM | POA: Diagnosis present

## 2024-06-11 DIAGNOSIS — N400 Enlarged prostate without lower urinary tract symptoms: Secondary | ICD-10-CM | POA: Diagnosis not present

## 2024-06-11 DIAGNOSIS — E875 Hyperkalemia: Secondary | ICD-10-CM | POA: Diagnosis present

## 2024-06-11 DIAGNOSIS — N179 Acute kidney failure, unspecified: Secondary | ICD-10-CM | POA: Diagnosis not present

## 2024-06-11 DIAGNOSIS — N1330 Unspecified hydronephrosis: Secondary | ICD-10-CM | POA: Diagnosis not present

## 2024-06-11 DIAGNOSIS — E872 Acidosis, unspecified: Secondary | ICD-10-CM | POA: Diagnosis present

## 2024-06-11 DIAGNOSIS — Z952 Presence of prosthetic heart valve: Secondary | ICD-10-CM

## 2024-06-11 DIAGNOSIS — I129 Hypertensive chronic kidney disease with stage 1 through stage 4 chronic kidney disease, or unspecified chronic kidney disease: Secondary | ICD-10-CM | POA: Diagnosis present

## 2024-06-11 DIAGNOSIS — E8729 Other acidosis: Secondary | ICD-10-CM | POA: Diagnosis present

## 2024-06-11 DIAGNOSIS — N3000 Acute cystitis without hematuria: Secondary | ICD-10-CM | POA: Diagnosis present

## 2024-06-11 DIAGNOSIS — D638 Anemia in other chronic diseases classified elsewhere: Secondary | ICD-10-CM | POA: Diagnosis not present

## 2024-06-11 DIAGNOSIS — D631 Anemia in chronic kidney disease: Secondary | ICD-10-CM | POA: Diagnosis present

## 2024-06-11 DIAGNOSIS — N320 Bladder-neck obstruction: Secondary | ICD-10-CM | POA: Diagnosis present

## 2024-06-11 DIAGNOSIS — Z79899 Other long term (current) drug therapy: Secondary | ICD-10-CM

## 2024-06-11 DIAGNOSIS — Z9842 Cataract extraction status, left eye: Secondary | ICD-10-CM

## 2024-06-11 DIAGNOSIS — N210 Calculus in bladder: Secondary | ICD-10-CM | POA: Diagnosis present

## 2024-06-11 DIAGNOSIS — D72829 Elevated white blood cell count, unspecified: Secondary | ICD-10-CM | POA: Diagnosis present

## 2024-06-11 DIAGNOSIS — R338 Other retention of urine: Principal | ICD-10-CM | POA: Diagnosis present

## 2024-06-11 DIAGNOSIS — E8809 Other disorders of plasma-protein metabolism, not elsewhere classified: Secondary | ICD-10-CM | POA: Diagnosis present

## 2024-06-11 DIAGNOSIS — N1832 Chronic kidney disease, stage 3b: Secondary | ICD-10-CM | POA: Diagnosis present

## 2024-06-11 DIAGNOSIS — Z8249 Family history of ischemic heart disease and other diseases of the circulatory system: Secondary | ICD-10-CM

## 2024-06-11 DIAGNOSIS — E782 Mixed hyperlipidemia: Secondary | ICD-10-CM | POA: Diagnosis present

## 2024-06-11 DIAGNOSIS — N401 Enlarged prostate with lower urinary tract symptoms: Secondary | ICD-10-CM | POA: Diagnosis not present

## 2024-06-11 DIAGNOSIS — Z9841 Cataract extraction status, right eye: Secondary | ICD-10-CM

## 2024-06-11 DIAGNOSIS — M109 Gout, unspecified: Secondary | ICD-10-CM | POA: Diagnosis present

## 2024-06-11 DIAGNOSIS — N136 Pyonephrosis: Secondary | ICD-10-CM | POA: Diagnosis present

## 2024-06-11 DIAGNOSIS — I2510 Atherosclerotic heart disease of native coronary artery without angina pectoris: Secondary | ICD-10-CM | POA: Diagnosis present

## 2024-06-11 DIAGNOSIS — I5032 Chronic diastolic (congestive) heart failure: Secondary | ICD-10-CM | POA: Diagnosis present

## 2024-06-11 DIAGNOSIS — Z833 Family history of diabetes mellitus: Secondary | ICD-10-CM

## 2024-06-11 DIAGNOSIS — I10 Essential (primary) hypertension: Secondary | ICD-10-CM | POA: Diagnosis not present

## 2024-06-11 DIAGNOSIS — Z888 Allergy status to other drugs, medicaments and biological substances status: Secondary | ICD-10-CM

## 2024-06-11 DIAGNOSIS — Z820 Family history of epilepsy and other diseases of the nervous system: Secondary | ICD-10-CM

## 2024-06-11 DIAGNOSIS — I700 Atherosclerosis of aorta: Secondary | ICD-10-CM | POA: Diagnosis present

## 2024-06-11 DIAGNOSIS — R339 Retention of urine, unspecified: Secondary | ICD-10-CM | POA: Diagnosis not present

## 2024-06-11 DIAGNOSIS — N132 Hydronephrosis with renal and ureteral calculous obstruction: Secondary | ICD-10-CM | POA: Diagnosis not present

## 2024-06-11 DIAGNOSIS — N3001 Acute cystitis with hematuria: Secondary | ICD-10-CM

## 2024-06-11 DIAGNOSIS — F0390 Unspecified dementia without behavioral disturbance: Secondary | ICD-10-CM | POA: Diagnosis present

## 2024-06-11 DIAGNOSIS — Z1152 Encounter for screening for COVID-19: Secondary | ICD-10-CM

## 2024-06-11 DIAGNOSIS — R040 Epistaxis: Secondary | ICD-10-CM | POA: Diagnosis not present

## 2024-06-11 HISTORY — DX: Nonrheumatic aortic (valve) stenosis: I35.0

## 2024-06-11 LAB — CBC WITH DIFFERENTIAL/PLATELET
Abs Immature Granulocytes: 0.17 K/uL — ABNORMAL HIGH (ref 0.00–0.07)
Basophils Absolute: 0.1 K/uL (ref 0.0–0.1)
Basophils Relative: 0 %
Eosinophils Absolute: 0.1 K/uL (ref 0.0–0.5)
Eosinophils Relative: 1 %
HCT: 29.5 % — ABNORMAL LOW (ref 39.0–52.0)
Hemoglobin: 9.7 g/dL — ABNORMAL LOW (ref 13.0–17.0)
Immature Granulocytes: 1 %
Lymphocytes Relative: 5 %
Lymphs Abs: 0.7 K/uL (ref 0.7–4.0)
MCH: 30.5 pg (ref 26.0–34.0)
MCHC: 32.9 g/dL (ref 30.0–36.0)
MCV: 92.8 fL (ref 80.0–100.0)
Monocytes Absolute: 1.5 K/uL — ABNORMAL HIGH (ref 0.1–1.0)
Monocytes Relative: 9 %
Neutro Abs: 13.2 K/uL — ABNORMAL HIGH (ref 1.7–7.7)
Neutrophils Relative %: 84 %
Platelets: 234 K/uL (ref 150–400)
RBC: 3.18 MIL/uL — ABNORMAL LOW (ref 4.22–5.81)
RDW: 15.2 % (ref 11.5–15.5)
WBC: 15.6 K/uL — ABNORMAL HIGH (ref 4.0–10.5)
nRBC: 0 % (ref 0.0–0.2)

## 2024-06-11 LAB — I-STAT CG4 LACTIC ACID, ED: Lactic Acid, Venous: 0.9 mmol/L (ref 0.5–1.9)

## 2024-06-11 LAB — URINALYSIS, ROUTINE W REFLEX MICROSCOPIC
Bacteria, UA: NONE SEEN
Bilirubin Urine: NEGATIVE
Glucose, UA: NEGATIVE mg/dL
Ketones, ur: NEGATIVE mg/dL
Nitrite: NEGATIVE
Protein, ur: NEGATIVE mg/dL
RBC / HPF: >50 RBC/hpf (ref 0–5)
Specific Gravity, Urine: 1.011 (ref 1.005–1.030)
WBC, UA: >50 WBC/hpf (ref 0–5)
pH: 5 (ref 5.0–8.0)

## 2024-06-11 LAB — PROTIME-INR
INR: 1.2 (ref 0.8–1.2)
Prothrombin Time: 15.8 s — ABNORMAL HIGH (ref 11.4–15.2)

## 2024-06-11 LAB — COMPREHENSIVE METABOLIC PANEL WITH GFR
ALT: 5 U/L (ref 0–44)
AST: 17 U/L (ref 15–41)
Albumin: 2.8 g/dL — ABNORMAL LOW (ref 3.5–5.0)
Alkaline Phosphatase: 61 U/L (ref 38–126)
Anion gap: 26 — ABNORMAL HIGH (ref 5–15)
BUN: 196 mg/dL — ABNORMAL HIGH (ref 8–23)
CO2: 13 mmol/L — ABNORMAL LOW (ref 22–32)
Calcium: 8.8 mg/dL — ABNORMAL LOW (ref 8.9–10.3)
Chloride: 98 mmol/L (ref 98–111)
Creatinine, Ser: 13.7 mg/dL — ABNORMAL HIGH (ref 0.61–1.24)
GFR, Estimated: 3 mL/min — ABNORMAL LOW (ref >60)
Glucose, Bld: 98 mg/dL (ref 70–99)
Potassium: 5.5 mmol/L — ABNORMAL HIGH (ref 3.5–5.1)
Sodium: 137 mmol/L (ref 135–145)
Total Bilirubin: 1.8 mg/dL — ABNORMAL HIGH (ref 0.0–1.2)
Total Protein: 5.8 g/dL — ABNORMAL LOW (ref 6.5–8.1)

## 2024-06-11 MED ORDER — SODIUM CHLORIDE 0.9 % IV BOLUS
1000.0000 mL | Freq: Once | INTRAVENOUS | Status: AC
  Administered 2024-06-11: 1000 mL via INTRAVENOUS

## 2024-06-11 MED ORDER — SODIUM CHLORIDE 0.9 % IV SOLN
1.0000 g | Freq: Once | INTRAVENOUS | Status: AC
  Administered 2024-06-11: 1 g via INTRAVENOUS
  Filled 2024-06-11: qty 10

## 2024-06-11 MED ORDER — SODIUM CHLORIDE 0.9 % IV SOLN: Freq: Once | INTRAVENOUS | Status: AC

## 2024-06-11 NOTE — Sepsis Progress Note (Signed)
 Elink following code sepsis

## 2024-06-11 NOTE — ED Provider Triage Note (Signed)
 Emergency Medicine Provider Triage Evaluation Note  Drew Adams , a 83 y.o. male  was evaluated in triage.  Pt complains of decrease urine output since Thursday.  Patient member and patient contribute to story.  Son reports that he is mostly at his baseline but has had some confusion.  No nausea or vomiting but decrease in appetite.  Denies any abdominal pain.  Son reports was initially complaining of some incontinence a few days ago but now has had very decrease in urine output but also has not been drinking much.  Denies any fevers.  Patient denies any belly pain, chest pain, or shortness of breath.  Has been having nosebleeds when blowing his nose since his TAVR as well.  Review of Systems  Positive:  Negative:   Physical Exam  BP (!) 146/58 (BP Location: Right Arm)   Pulse 83   Temp (!) 97.5 F (36.4 C)   Resp 18   SpO2 100%  Gen:   Awake, no distress   Resp:  Normal effort  MSK:   Moves extremities without difficulty  Other:  Hernia present in the abdomen.  Medical Decision Making  Medically screening exam initiated at 5:20 PM.  Appropriate orders placed.  Abdulla Pooley was informed that the remainder of the evaluation will be completed by another provider, this initial triage assessment does not replace that evaluation, and the importance of remaining in the ED until their evaluation is complete.  Bladder scan shows greater than 862 cc in the bladder.  Will place Foley catheter now.  Patient has known history of BPH, likely having urinary obstruction from this.  Will obtain labs as well.   Bernis Ernst, PA-C 06/11/24 [id number]

## 2024-06-11 NOTE — ED Triage Notes (Signed)
 QUICK TRIAGE: Pt ambulatory to ER with c/o decreased urine output and weakness since valve replacement 10 days ago.

## 2024-06-11 NOTE — ED Provider Notes (Signed)
 [place] EMERGENCY DEPARTMENT AT [facility] Provider Note   CSN: [id number] Arrival date & time: 06/11/24  1612     Patient presents with: Post-op Problem   Drew Adams is a 83 y.o. male.  {Add pertinent medical, surgical, social history, OB history to HPI:32947} Pt is a 83 yo male post op TVAR on 11/4 presenting for decreased urinary output, decreased PO intake, generalized weakness, fatigue, and pallor. No abdominal pain or back pain. Postvoid residual volume >863ml. Foley cath placed in triage. Pt had 1975 ml output immediately with an additional 700 ml of urine with frank blood during my evaluation. Cr. 13.7 today. Was 1.96 prior to discharge after procedure on 11/5. Has hx of BPH but never urinary retention. WBC 15. Denies fevers/chills.   The history is provided by the patient. No language interpreter was used.       Prior to Admission medications   Medication Sig Start Date End Date Taking? Authorizing Provider  allopurinol  (ZYLOPRIM ) 300 MG tablet Take 300 mg by mouth daily. 11/15/12   [provider]  amoxicillin (AMOXIL) 500 MG capsule Take 4 capsules by mouth 30 min to 1 hour prior to dental procedures 06/08/24   Darryle Thom CROME, PA-C  Ascorbic Acid  (VITAMIN C ) 1000 MG tablet Take 1,000 mg by mouth daily.    [provider]  aspirin 81 MG chewable tablet Chew 1 tablet (81 mg total) by mouth daily. 06/01/24   Sebastian Lamarr SAUNDERS, PA-C  calcium  citrate (CALCITRATE - DOSED IN MG ELEMENTAL CALCIUM ) 950 MG tablet Take 600 mg by mouth daily.    [provider]  Cholecalciferol (VITAMIN D3) 10000 UNITS capsule Take 10,000 Units by mouth daily. 10000units 3x per week, 5000units 4x per week.    [provider]  cyanocobalamin (VITAMIN B12) 1000 MCG tablet Take 1,000 mcg by mouth daily.    [provider]  finasteride (PROSCAR) 5 MG tablet Take 5 mg by mouth daily.    [provider]  hydrocortisone cream 1 % Apply  1 Application topically daily.    [provider]  MAGNESIUM GLYCINATE PO Take 400 mg by mouth daily.    [provider]  meclizine  (ANTIVERT ) 25 MG tablet Take 1 tablet (25 mg total) by mouth 3 (three) times daily as needed for dizziness. 02/23/24   Mannie Fairy DASEN, DO  Multiple Vitamin (MULTIVITAMIN WITH MINERALS) TABS Take 1 tablet by mouth daily. Mature    [provider]  OVER THE COUNTER MEDICATION Take 1 tablet by mouth daily. prostacor    [provider]  predniSONE  (DELTASONE ) 5 MG tablet Take 5 mg by mouth daily as needed (Gout). 01/07/18   [provider]  psyllium (METAMUCIL) 58.6 % packet Take 1 packet by mouth daily. 10 ml    [provider]  PURE L-ARGININE HCL PO Take 1,800 mg by mouth daily.    [provider]  Zinc  Gluconate 15 MG TABS Take 50 mg by mouth daily. Plus copper 2 mg    [provider]    Allergies: Crestor  [rosuvastatin ], Exforge [amlodipine besylate-valsartan], and Simvastatin    Review of Systems  Constitutional:  Positive for fatigue. Negative for chills and fever.  HENT:  Negative for ear pain and sore throat.   Eyes:  Negative for pain and visual disturbance.  Respiratory:  Negative for cough and shortness of breath.   Cardiovascular:  Negative for chest pain and palpitations.  Gastrointestinal:  Negative for abdominal pain and  vomiting.  Genitourinary:  Positive for decreased urine volume and difficulty urinating. Negative for dysuria, hematuria, penile swelling, scrotal swelling and testicular pain.  Musculoskeletal:  Negative for arthralgias and back pain.  Skin:  Negative for color change and rash.  Neurological:  Positive for weakness. Negative for seizures and syncope.  All other systems reviewed and are negative.   Updated Vital Signs BP (!) 146/58 (BP Location: Right Arm)   Pulse 83   Temp (!) 97.5 F (36.4 C)   Resp 18   SpO2 100%   Physical Exam Vitals and nursing  note reviewed.  Constitutional:      General: He is not in acute distress.    Appearance: He is well-developed.  HENT:     Head: Normocephalic and atraumatic.  Eyes:     Conjunctiva/sclera: Conjunctivae normal.  Cardiovascular:     Rate and Rhythm: Normal rate and regular rhythm.     Heart sounds: No murmur heard. Pulmonary:     Effort: Pulmonary effort is normal. No respiratory distress.     Breath sounds: Normal breath sounds.  Abdominal:     Palpations: Abdomen is soft.     Tenderness: There is no abdominal tenderness.  Musculoskeletal:        General: No swelling.     Cervical back: Neck supple.  Skin:    General: Skin is warm and dry.     Capillary Refill: Capillary refill takes less than 2 seconds.  Neurological:     Mental Status: He is alert.  Psychiatric:        Mood and Affect: Mood normal.     (all labs ordered are listed, but only abnormal results are displayed) Labs Reviewed  URINALYSIS, ROUTINE W REFLEX MICROSCOPIC - Abnormal; Notable for the following components:      Result Value   APPearance CLOUDY (*)    Hgb urine dipstick LARGE (*)    Leukocytes,Ua LARGE (*)    All other components within normal limits  CBC WITH DIFFERENTIAL/PLATELET - Abnormal; Notable for the following components:   WBC 15.6 (*)    RBC 3.18 (*)    Hemoglobin 9.7 (*)    HCT 29.5 (*)    Neutro Abs 13.2 (*)    Monocytes Absolute 1.5 (*)    Abs Immature Granulocytes 0.17 (*)    All other components within normal limits  COMPREHENSIVE METABOLIC PANEL WITH GFR - Abnormal; Notable for the following components:   Potassium 5.5 (*)    CO2 13 (*)    BUN 196 (*)    Creatinine, Ser 13.70 (*)    Calcium  8.8 (*)    Total Protein 5.8 (*)    Albumin 2.8 (*)    Total Bilirubin 1.8 (*)    GFR, Estimated 3 (*)    Anion gap 26 (*)    All other components within normal limits  PROTIME-INR - Abnormal; Notable for the following components:   Prothrombin Time 15.8 (*)    All other components  within normal limits  URINE CULTURE  RESP PANEL BY RT-PCR (RSV, FLU A&B, COVID)  RVPGX2  CULTURE, BLOOD (ROUTINE X 2)  CULTURE, BLOOD (ROUTINE X 2)  URINE CULTURE  I-STAT CG4 LACTIC ACID, ED    EKG: None  Radiology: No results found.  {Document cardiac monitor, telemetry assessment procedure when appropriate:32947} Procedures   Medications Ordered in the ED  cefTRIAXone (ROCEPHIN) 1 g in sodium chloride  0.9 % 100 mL IVPB (has no administration in time range)  sodium  chloride 0.9 % bolus 1,000 mL (has no administration in time range)  0.9 %  sodium chloride  infusion (has no administration in time range)      {Click here for ABCD2, HEART and other calculators REFRESH Note before signing:1}                              Medical Decision Making Amount and/or Complexity of Data Reviewed Labs: ordered. Radiology: ordered.  Risk Prescription drug management. Decision regarding hospitalization.   ***  {Document critical care time when appropriate  Document review of labs and clinical decision tools ie CHADS2VASC2, etc  Document your independent review of radiology images and any outside records  Document your discussion with family members, caretakers and with consultants  Document social determinants of health affecting pt's care  Document your decision making why or why not admission, treatments were needed:32947:::1}   Final diagnoses:  Acute urinary retention  Acute cystitis with hematuria  AKI (acute kidney injury)    ED Discharge Orders     None

## 2024-06-12 ENCOUNTER — Encounter (HOSPITAL_COMMUNITY): Payer: Self-pay | Admitting: Internal Medicine

## 2024-06-12 DIAGNOSIS — Z952 Presence of prosthetic heart valve: Secondary | ICD-10-CM | POA: Diagnosis not present

## 2024-06-12 DIAGNOSIS — N401 Enlarged prostate with lower urinary tract symptoms: Secondary | ICD-10-CM | POA: Diagnosis present

## 2024-06-12 DIAGNOSIS — N3001 Acute cystitis with hematuria: Secondary | ICD-10-CM | POA: Diagnosis not present

## 2024-06-12 DIAGNOSIS — N136 Pyonephrosis: Secondary | ICD-10-CM | POA: Diagnosis present

## 2024-06-12 DIAGNOSIS — E8729 Other acidosis: Secondary | ICD-10-CM | POA: Diagnosis present

## 2024-06-12 DIAGNOSIS — N1831 Chronic kidney disease, stage 3a: Secondary | ICD-10-CM | POA: Diagnosis not present

## 2024-06-12 DIAGNOSIS — N400 Enlarged prostate without lower urinary tract symptoms: Secondary | ICD-10-CM | POA: Diagnosis not present

## 2024-06-12 DIAGNOSIS — N179 Acute kidney failure, unspecified: Secondary | ICD-10-CM | POA: Diagnosis present

## 2024-06-12 DIAGNOSIS — Z833 Family history of diabetes mellitus: Secondary | ICD-10-CM | POA: Diagnosis not present

## 2024-06-12 DIAGNOSIS — E872 Acidosis, unspecified: Secondary | ICD-10-CM | POA: Diagnosis present

## 2024-06-12 DIAGNOSIS — F0390 Unspecified dementia without behavioral disturbance: Secondary | ICD-10-CM | POA: Diagnosis present

## 2024-06-12 DIAGNOSIS — E875 Hyperkalemia: Secondary | ICD-10-CM | POA: Diagnosis present

## 2024-06-12 DIAGNOSIS — N1832 Chronic kidney disease, stage 3b: Secondary | ICD-10-CM

## 2024-06-12 DIAGNOSIS — D631 Anemia in chronic kidney disease: Secondary | ICD-10-CM | POA: Diagnosis present

## 2024-06-12 DIAGNOSIS — Z8249 Family history of ischemic heart disease and other diseases of the circulatory system: Secondary | ICD-10-CM | POA: Diagnosis not present

## 2024-06-12 DIAGNOSIS — E782 Mixed hyperlipidemia: Secondary | ICD-10-CM | POA: Diagnosis present

## 2024-06-12 DIAGNOSIS — I129 Hypertensive chronic kidney disease with stage 1 through stage 4 chronic kidney disease, or unspecified chronic kidney disease: Secondary | ICD-10-CM | POA: Diagnosis present

## 2024-06-12 DIAGNOSIS — Z1152 Encounter for screening for COVID-19: Secondary | ICD-10-CM | POA: Diagnosis not present

## 2024-06-12 DIAGNOSIS — M109 Gout, unspecified: Secondary | ICD-10-CM | POA: Diagnosis present

## 2024-06-12 DIAGNOSIS — D638 Anemia in other chronic diseases classified elsewhere: Secondary | ICD-10-CM | POA: Diagnosis present

## 2024-06-12 DIAGNOSIS — N3000 Acute cystitis without hematuria: Secondary | ICD-10-CM | POA: Diagnosis present

## 2024-06-12 DIAGNOSIS — R338 Other retention of urine: Secondary | ICD-10-CM | POA: Diagnosis present

## 2024-06-12 DIAGNOSIS — Z9842 Cataract extraction status, left eye: Secondary | ICD-10-CM | POA: Diagnosis not present

## 2024-06-12 DIAGNOSIS — N210 Calculus in bladder: Secondary | ICD-10-CM | POA: Diagnosis present

## 2024-06-12 DIAGNOSIS — D72829 Elevated white blood cell count, unspecified: Secondary | ICD-10-CM | POA: Diagnosis present

## 2024-06-12 DIAGNOSIS — Z888 Allergy status to other drugs, medicaments and biological substances status: Secondary | ICD-10-CM | POA: Diagnosis not present

## 2024-06-12 DIAGNOSIS — Z9841 Cataract extraction status, right eye: Secondary | ICD-10-CM | POA: Diagnosis not present

## 2024-06-12 DIAGNOSIS — I5032 Chronic diastolic (congestive) heart failure: Secondary | ICD-10-CM | POA: Diagnosis present

## 2024-06-12 DIAGNOSIS — I700 Atherosclerosis of aorta: Secondary | ICD-10-CM | POA: Diagnosis present

## 2024-06-12 DIAGNOSIS — I2510 Atherosclerotic heart disease of native coronary artery without angina pectoris: Secondary | ICD-10-CM | POA: Diagnosis present

## 2024-06-12 DIAGNOSIS — E8809 Other disorders of plasma-protein metabolism, not elsewhere classified: Secondary | ICD-10-CM | POA: Diagnosis present

## 2024-06-12 DIAGNOSIS — N320 Bladder-neck obstruction: Secondary | ICD-10-CM | POA: Diagnosis present

## 2024-06-12 DIAGNOSIS — Z7982 Long term (current) use of aspirin: Secondary | ICD-10-CM | POA: Diagnosis not present

## 2024-06-12 LAB — CBC WITH DIFFERENTIAL/PLATELET
Abs Immature Granulocytes: 0.11 K/uL — ABNORMAL HIGH (ref 0.00–0.07)
Basophils Absolute: 0 K/uL (ref 0.0–0.1)
Basophils Relative: 0 %
Eosinophils Absolute: 0.1 K/uL (ref 0.0–0.5)
Eosinophils Relative: 1 %
HCT: 28.1 % — ABNORMAL LOW (ref 39.0–52.0)
Hemoglobin: 9.1 g/dL — ABNORMAL LOW (ref 13.0–17.0)
Immature Granulocytes: 1 %
Lymphocytes Relative: 5 %
Lymphs Abs: 0.5 K/uL — ABNORMAL LOW (ref 0.7–4.0)
MCH: 30.1 pg (ref 26.0–34.0)
MCHC: 32.4 g/dL (ref 30.0–36.0)
MCV: 93 fL (ref 80.0–100.0)
Monocytes Absolute: 1 K/uL (ref 0.1–1.0)
Monocytes Relative: 10 %
Neutro Abs: 8.4 K/uL — ABNORMAL HIGH (ref 1.7–7.7)
Neutrophils Relative %: 83 %
Platelets: 201 K/uL (ref 150–400)
RBC: 3.02 MIL/uL — ABNORMAL LOW (ref 4.22–5.81)
RDW: 15.3 % (ref 11.5–15.5)
WBC: 10.2 K/uL (ref 4.0–10.5)
nRBC: 0 % (ref 0.0–0.2)

## 2024-06-12 LAB — RESP PANEL BY RT-PCR (RSV, FLU A&B, COVID)  RVPGX2
Influenza A by PCR: NEGATIVE
Influenza B by PCR: NEGATIVE
Resp Syncytial Virus by PCR: NEGATIVE
SARS Coronavirus 2 by RT PCR: NEGATIVE

## 2024-06-12 LAB — COMPREHENSIVE METABOLIC PANEL WITH GFR
ALT: <5 U/L (ref 0–44)
AST: 15 U/L (ref 15–41)
Albumin: 2.4 g/dL — ABNORMAL LOW (ref 3.5–5.0)
Alkaline Phosphatase: 53 U/L (ref 38–126)
Anion gap: 18 — ABNORMAL HIGH (ref 5–15)
BUN: 157 mg/dL — ABNORMAL HIGH (ref 8–23)
CO2: 14 mmol/L — ABNORMAL LOW (ref 22–32)
Calcium: 8.4 mg/dL — ABNORMAL LOW (ref 8.9–10.3)
Chloride: 112 mmol/L — ABNORMAL HIGH (ref 98–111)
Creatinine, Ser: 8.87 mg/dL — ABNORMAL HIGH (ref 0.61–1.24)
GFR, Estimated: 6 mL/min — ABNORMAL LOW (ref >60)
Glucose, Bld: 87 mg/dL (ref 70–99)
Potassium: 4.2 mmol/L (ref 3.5–5.1)
Sodium: 144 mmol/L (ref 135–145)
Total Bilirubin: 1.5 mg/dL — ABNORMAL HIGH (ref 0.0–1.2)
Total Protein: 4.8 g/dL — ABNORMAL LOW (ref 6.5–8.1)

## 2024-06-12 LAB — FERRITIN: Ferritin: 1048 ng/mL — ABNORMAL HIGH (ref 24–336)

## 2024-06-12 LAB — MAGNESIUM: Magnesium: 2.8 mg/dL — ABNORMAL HIGH (ref 1.7–2.4)

## 2024-06-12 LAB — URINE CULTURE: Culture: NO GROWTH

## 2024-06-12 LAB — IRON AND TIBC
Iron: 55 ug/dL (ref 45–182)
Saturation Ratios: 35 % (ref 17.9–39.5)
TIBC: 158 ug/dL — ABNORMAL LOW (ref 250–450)
UIBC: 103 ug/dL

## 2024-06-12 MED ORDER — ACETAMINOPHEN 650 MG RE SUPP: 650.0000 mg | Freq: Four times a day (QID) | RECTAL | Status: DC | PRN

## 2024-06-12 MED ORDER — FINASTERIDE 5 MG PO TABS
5.0000 mg | ORAL_TABLET | Freq: Every day | ORAL | Status: DC
  Administered 2024-06-12 – 2024-06-16 (×5): 5 mg via ORAL
  Filled 2024-06-12 (×6): qty 1

## 2024-06-12 MED ORDER — LACTATED RINGERS IV SOLN: INTRAVENOUS | Status: AC

## 2024-06-12 MED ORDER — MELATONIN 3 MG PO TABS
3.0000 mg | ORAL_TABLET | Freq: Every evening | ORAL | Status: DC | PRN
  Administered 2024-06-14 – 2024-06-15 (×2): 3 mg via ORAL
  Filled 2024-06-12 (×2): qty 1

## 2024-06-12 MED ORDER — SODIUM CHLORIDE 0.9 % IV SOLN
1.0000 g | INTRAVENOUS | Status: DC
  Administered 2024-06-12 – 2024-06-15 (×4): 1 g via INTRAVENOUS
  Filled 2024-06-12 (×4): qty 10

## 2024-06-12 MED ORDER — ACETAMINOPHEN 325 MG PO TABS: 650.0000 mg | ORAL_TABLET | Freq: Four times a day (QID) | ORAL | Status: DC | PRN

## 2024-06-12 MED ORDER — TAMSULOSIN HCL 0.4 MG PO CAPS
0.4000 mg | ORAL_CAPSULE | Freq: Every day | ORAL | Status: DC
  Administered 2024-06-12 – 2024-06-16 (×5): 0.4 mg via ORAL
  Filled 2024-06-12 (×5): qty 1

## 2024-06-12 MED ORDER — ONDANSETRON HCL 4 MG/2ML IJ SOLN: 4.0000 mg | Freq: Four times a day (QID) | INTRAMUSCULAR | Status: DC | PRN

## 2024-06-12 NOTE — H&P (Signed)
 History and Physical      Drew Adams FMW:[id number] DOB: [birth year] DOA: 06/11/2024; DOS: 06/12/2024  PCP: Stacia Millman, PA  Patient coming from: home   I have personally briefly reviewed patient's old medical records in [facility]  Chief Complaint: Diminished urine output  HPI: Drew Adams is a 83 y.o. male with medical history significant for severe aortic stenosis status post TAVR on 05/30/2024, BPH, CKD 3B with baseline creatinine 1.3-2.0, anemia of chronic disease with baseline hemoglobin 10-13, who is admitted to [facility] on 06/11/2024 with acute renal failure in the setting of acute urinary retention, after presenting from home to [facility] ED complaining of diminished urine output.   In the setting of a history of severe aortic stenosis, the patient underwent TAVR on 05/30/2024.  Subsequent to this procedure, he has noted diminished urine output, with some suprapubic discomfort, and more recently development of left-sided flank pain.  Denies any associated subjective fever, chills, rigors, or generalized myalgias.  No recent chest pain, shortness of breath, cough.  He has a history of stage IIIb CKD with baseline creatinine range 1.3-2.0, with most recent prior creatinine data point noted to be 1.96 on 05/31/2024, corresponding with postop day 1 status post TAVR.  He also has a history of BPH, for which he is prescribed Proscar.     ED Course:  Vital signs in the ED were notable for the following: Afebrile; heart rates in the 70s to 80s; systolic blood pressures in the 130s to 140s; respiratory rate 18-20, oxygen saturation 100% on room air.  Labs were notable for the following: CMP was notable for the following: Sodium 137, potassium 5.5, bicarbonate 13, anion gap 26, creatinine 13.70, glucose 98, calcium  adjusted for mild hypoalbuminemia noted to be 9.8, avidin 2.8, total bilirubin 1.8.  Otherwise, liver enzymes were within normal limits.  Lactic acid 0.9.  CBC  notable for white blood cell count 15,600 with 84% neutrophils, hemoglobin 9.7 associated Neuraceq/Norocarp properties as well as nonelevated RDW and relative to most recent prior hemoglobin data point of 10.2 on 06/08/2024 [place] count 234.  INR 1.2.  Urinalysis associated with a cloudy, yellow appearing specimen and notable for greater than 50 white blood cells, large leukocyte esterase, no evidence of succumbs epithelial cells, also showing large hemoglobin with greater than 50 red blood cells and no protein.  Blood cultures x 2 as well as urine culture were collected prior to initiation of IV antibiotics.  Postvoid residual bladder scan in the ED showed greater than 800 cc.  Foley catheter was subsequently placed in the ED, with immediate output of 2 L of urine.  Subsequently, the patient has put out greater than 1 L of additional urine via the Foley catheter.  Per my interpretation, EKG in ED demonstrated the following: Interpretation of this EKG was limited by the presence of motion artifact.  However, within these confines, appears to show sinus rhythm with heart rate 75, QTc was noted to be 493, and no evidence of T wave inversion or ST changes noted, including no evidence of ST elevation.  Imaging in the ED, per corresponding formal radiology read, was notable for the following: CT renal stone study, in comparison to CT abdomen/pelvis performed on 04/11/2024, showed interval development of mild left hydronephrosis to the level of the left ureterovesicular junction, potentially due to bladder outlet obstruction, with enlargement of prostate noted.  Additionally, today CT renal stone study showed interval development of moderate left perinephric stranding, in the absence  of any evidence of ureteral stone.  Today's imaging also showed multiple 2 to 3 mm bladder calculi, which appear unchanged from CT abdomen/pelvis on 04/11/2024, with today's CT renal stone study also showing small right and trace left  lateral effusions.  EDP d/w on-call urology, Dr. Shane, who will consult and see patient in the AM at [facility].   While in the ED, the following were administered: Normal saline x 1 L bolus, Rocephin.  Subsequently, the patient was admitted for further evaluation and management of presenting acute renal failure superimposed on CKD 3B in the setting of acute urinary retention with presentation also notable for urinary tract infection, mild hyperkalemia and anion gap metabolic acidosis.      Review of Systems: As per HPI otherwise 10 point review of systems negative.   Past Medical History:  Diagnosis Date   Aortic valve disease    BPH (benign prostatic hyperplasia)    Diverticulosis of colon without hemorrhage 11/28/2012   ED (erectile dysfunction)    Elevated PSA    Gout    uses prednisone  1-2x/week   Hypertension    Iron deficiency anemia 11/28/2012   Mitral valve disease    Mixed hyperlipidemia    Ocular migraine    Paraesophageal hiatal hernia 11/28/2012   Radiculopathy    Thoracic aortic aneurysm (TAA)     Past Surgical History:  Procedure Laterality Date   CATARACT EXTRACTION Right 2012   CATARACT EXTRACTION Left Jan 2014   CORONARY ANGIOGRAPHY N/A 05/03/2024   Procedure: CORONARY ANGIOGRAPHY;  Surgeon: Verlin Lonni BIRCH, MD;  Location: [facility] INVASIVE CV LAB;  Service: Cardiovascular;  Laterality: N/A;   HERNIA REPAIR Right 1970s   open RIH repair   INTRAOPERATIVE TRANSTHORACIC ECHOCARDIOGRAM N/A 05/30/2024   Procedure: ECHOCARDIOGRAM, TRANSTHORACIC;  Surgeon: Wendel Lurena POUR, MD;  Location: [facility] INVASIVE CV LAB;  Service: Cardiovascular;  Laterality: N/A;   LAPAROSCOPIC PARTIAL COLECTOMY N/A 12/27/2012   Procedure: LAPAROSCOPIC  PARTIAL COLECTOMY RIGID PROCTOSCOPY ;  Surgeon: Elspeth KYM Schultze, MD;  Location: [facility] ORS;  Service: General;  Laterality: N/A;   LAPAROSCOPIC SIGMOID COLECTOMY  12/27/2012   TONSILLECTOMY AND ADENOIDECTOMY      Social History:  reports that he  has never smoked. He has never used smokeless tobacco. He reports that he does not drink alcohol and does not use drugs.   Allergies  Allergen Reactions   Crestor  Daven.cyphers ] Other (See Comments)    Leg pain and cramping   Exforge [Amlodipine Besylate-Valsartan]    Simvastatin     Family History  Problem Relation Age of Onset   Heart attack Mother    CAD Mother    Parkinson's disease Father    Diabetes Father    Cancer Sister    Diabetes Sister     Family history reviewed and not pertinent    Prior to Admission medications   Medication Sig Start Date End Date Taking? Authorizing Provider  allopurinol  (ZYLOPRIM ) 300 MG tablet Take 300 mg by mouth daily. 11/15/12   [provider]  amoxicillin (AMOXIL) 500 MG capsule Take 4 capsules by mouth 30 min to 1 hour prior to dental procedures 06/08/24   Darryle Thom CROME, PA-C  Ascorbic Acid  (VITAMIN C ) 1000 MG tablet Take 1,000 mg by mouth daily.    [provider]  aspirin 81 MG chewable tablet Chew 1 tablet (81 mg total) by mouth daily. 06/01/24   Sebastian Lamarr SAUNDERS, PA-C  calcium  citrate (CALCITRATE - DOSED IN MG ELEMENTAL CALCIUM ) 950 MG  tablet Take 600 mg by mouth daily.    [provider]  Cholecalciferol (VITAMIN D3) 10000 UNITS capsule Take 10,000 Units by mouth daily. 10000units 3x per week, 5000units 4x per week.    [provider]  cyanocobalamin (VITAMIN B12) 1000 MCG tablet Take 1,000 mcg by mouth daily.    [provider]  finasteride (PROSCAR) 5 MG tablet Take 5 mg by mouth daily.    [provider]  hydrocortisone cream 1 % Apply 1 Application topically daily.    [provider]  MAGNESIUM GLYCINATE PO Take 400 mg by mouth daily.    [provider]  meclizine  (ANTIVERT ) 25 MG tablet Take 1 tablet (25 mg total) by mouth 3 (three) times daily as needed for dizziness. 02/23/24   Mannie Fairy DASEN, DO  Multiple Vitamin (MULTIVITAMIN WITH MINERALS) TABS Take  1 tablet by mouth daily. Mature    [provider]  OVER THE COUNTER MEDICATION Take 1 tablet by mouth daily. prostacor    [provider]  predniSONE  (DELTASONE ) 5 MG tablet Take 5 mg by mouth daily as needed (Gout). 01/07/18   [provider]  psyllium (METAMUCIL) 58.6 % packet Take 1 packet by mouth daily. 10 ml    [provider]  PURE L-ARGININE HCL PO Take 1,800 mg by mouth daily.    [provider]  Zinc  Gluconate 15 MG TABS Take 50 mg by mouth daily. Plus copper 2 mg    [provider]     Objective    Physical Exam: Vitals:   06/11/24 1620 06/11/24 2109 06/11/24 2200  BP: (!) 146/58 (!) 142/51 (!) 130/52  Pulse: 83  74  Resp: 18 20 12   Temp: (!) 97.5 F (36.4 C) 97.7 F (36.5 C)   TempSrc:  Oral   SpO2: 100% 100% 100%    General: appears to be stated age; somnolent Skin: warm, dry, no rash Head:  AT/[place] Mouth:  Oral mucosa membranes appear dry, normal dentition Neck: supple; trachea midline Heart:  RRR; did not appreciate any M/R/G Lungs: CTAB, did not appreciate any wheezes, rales, or rhonchi Abdomen: + BS; soft, ND, NT Vascular: 2+ pedal pulses b/l; 2+ radial pulses b/l Extremities: no peripheral edema, no muscle wasting        Labs on Admission: I have personally reviewed following labs and imaging studies  CBC: Recent Labs  Lab 06/08/24 1122 06/11/24 1720  WBC 13.0* 15.6*  NEUTROABS 10.1* 13.2*  HGB 10.2* 9.7*  HCT 31.4* 29.5*  MCV 93 92.8  PLT 167 234   Basic Metabolic Panel: Recent Labs  Lab 06/11/24 1720  NA 137  K 5.5*  CL 98  CO2 13*  GLUCOSE 98  BUN 196*  CREATININE 13.70*  CALCIUM  8.8*   GFR: Estimated Creatinine Clearance: 3.9 mL/min (A) (by C-G formula based on SCr of 13.7 mg/dL (H)). Liver Function Tests: Recent Labs  Lab 06/11/24 1720  AST 17  ALT 5  ALKPHOS 61  BILITOT 1.8*  PROT 5.8*  ALBUMIN 2.8*   No results for input(s): LIPASE, AMYLASE in the last 168  hours. No results for input(s): AMMONIA in the last 168 hours. Coagulation Profile: Recent Labs  Lab 06/11/24 1720  INR 1.2   Cardiac Enzymes: No results for input(s): CKTOTAL, CKMB, CKMBINDEX, TROPONINI in the last 168 hours. BNP (last 3 results) No results for input(s): PROBNP in the last 8760 hours. HbA1C: No results for input(s): HGBA1C in the last 72 hours. CBG: No  results for input(s): GLUCAP in the last 168 hours. Lipid Profile: No results for input(s): CHOL, HDL, LDLCALC, TRIG, CHOLHDL, LDLDIRECT in the last 72 hours. Thyroid  Function Tests: No results for input(s): TSH, T4TOTAL, FREET4, T3FREE, THYROIDAB in the last 72 hours. Anemia Panel: No results for input(s): VITAMINB12, FOLATE, FERRITIN, TIBC, IRON, RETICCTPCT in the last 72 hours. Urine analysis:    Component Value Date/Time   COLORURINE YELLOW 06/11/2024 1655   APPEARANCEUR CLOUDY (A) 06/11/2024 1655   LABSPEC 1.011 06/11/2024 1655   PHURINE 5.0 06/11/2024 1655   GLUCOSEU NEGATIVE 06/11/2024 1655   HGBUR LARGE (A) 06/11/2024 1655   BILIRUBINUR NEGATIVE 06/11/2024 1655   KETONESUR NEGATIVE 06/11/2024 1655   PROTEINUR NEGATIVE 06/11/2024 1655   NITRITE NEGATIVE 06/11/2024 1655   LEUKOCYTESUR LARGE (A) 06/11/2024 1655    Radiological Exams on Admission: CT Renal Stone Study Result Date: 06/11/2024 EXAM: CT ABDOMEN AND PELVIS WITHOUT CONTRAST 06/11/2024 10:39:57 PM TECHNIQUE: CT of the abdomen and pelvis was performed without the administration of intravenous contrast. Multiplanar reformatted images are provided for review. Automated exposure control, iterative reconstruction, and/or weight-based adjustment of the mA/kV was utilized to reduce the radiation dose to as low as reasonably achievable. COMPARISON: 04/11/2024 CLINICAL HISTORY: Sepsis, UTI, acute renal failure. FINDINGS: LOWER CHEST: Small right and trace left pleural effusions, new from prior  examination. LIVER: The liver is unremarkable. GALLBLADDER AND BILE DUCTS: Gallbladder is unremarkable. No biliary ductal dilatation. SPLEEN: No acute abnormality. PANCREAS: No acute abnormality. ADRENAL GLANDS: No acute abnormality. KIDNEYS, URETERS AND BLADDER: The kidneys are normal in size and position. Punctate 1-2 mm nonobstructing calculi are seen within the kidneys bilaterally. Mild left hydronephrosis to the level of the left ureterovesicular junction, new from prior examination. This may be impinged by the enlarged central lobe of the prostate gland. Moderate left perinephric stranding. Changes within the left kidney may relate to acute pyelonephritis or represent residual obstructive changes secondary to a recently passed ureteral calculus. No hydronephrosis on the right. No ureteral calculi on the right. No perinephric or periureteral stranding on the right. Numerous 2-3 mm calculi are seen dependently within the bladder lumen, similar to prior examination. Foley catheter balloon is seen within an unfortunately decompressed bladder lumen. The bladder wall is circumferentially thickened. GI AND BOWEL: Appendix normal. The stomach, small bowel, and large bowel are otherwise unremarkable. There is no bowel obstruction. PERITONEUM AND RETROPERITONEUM: No ascites. No free air. VASCULATURE: Mild aortoiliac atherosclerotic calcification. No aortic aneurysm. Aorta is normal in caliber. LYMPH NODES: No lymphadenopathy. REPRODUCTIVE ORGANS: Enlarged central lobe of the prostate gland, which may be impinging the left ureterovesicular junction. BONES AND SOFT TISSUES: Osseous structures are age appropriate. No acute bone abnormality. No lytic or blastic bone lesion. No focal soft tissue abnormality. IMPRESSION: 1. Small right and trace left pleural effusions, new from prior examination 2. Mild left hydronephrosis to the level of the left ureterovesicular junction, possibly impinged by enlarged central prostatic  lobe, new from prior examination; moderate left perinephric stranding, correlate for urinary tract infection versus residual changes from a recently passed ureteral calculus 3. Numerous 23 mm dependent bladder calculi, similar to prior; punctate 12 mm nonobstructing bilateral renal calculi; no right hydronephrosis and no right ureteral calculus 4. raf score: Aortic atherosclerosis ([id number]-i70.0) Electronically signed by: Dorethia Molt MD 06/11/2024 10:52 PM EST RP Workstation: [id number]      Assessment/Plan   Principal Problem:   Acute renal failure superimposed on stage 3b chronic kidney disease (HCC) Active Problems:  History of transcatheter aortic valve replacement (TAVR)   Acute urinary retention   BPH (benign prostatic hyperplasia)   Acute cystitis   Leukocytosis   Hyperkalemia   High anion gap metabolic acidosis   Anemia of chronic disease      #) Acute renal failure superimposed on CKD 3B: In the setting of a history of CKD 3B with baseline creatinine range 1.3-2.0, and most recent prior creatinine noted to be 1.96 on 05/31/2024, presenting creatinine is found to be 13.70.  Appears to be on the basis of post renal obstruction in the setting of acute urinary retention, with potential contribution from BPH.  In the ED this evening, postvoid residual bladder scan was greater than 800, with ensuing placement of Foley catheter yielding immediate output of 2 L of urine, with an additional 1 L of urine output over the next few hours.  In the setting of this new urinary retention, today's CT renal stone study, compared to CT abdomen/pelvis from 04/11/2024, shows interval development of left-sided hydronephrosis to the level of the left ureterovesicular junction, without any evidence of ureteral stone, also noting enlarged prostate.  His presenting AKI is also associated with mild hyperkalemia, as further detailed below, as well as the presence of anion gap metabolic acidosis.  Imaging does  reveal small right and trace left pleural effusions, although the patient does not appear to be in any respiratory distress, and is maintaining oxygen saturations of 100% on room air.  With Foley catheter now in place, and continuing to drain appropriately, will closely monitor ensuing renal function trend, as further detailed below.  Today's urinalysis with microscopy showed significant pyuria, large hemoglobin, greater than 50 RBCs, but otherwise no evidence of significant urinary casts, including no protein.  In setting of acute renal failure from acute urinary retention as well as BPH, EDP is discussed patient's case with on-call urology, who will formally consult and see pt in the AM.   Plan: monitor strict I's & O's and daily weights. Attempt to avoid nephrotoxic agents. Refrain from NSAIDs. Repeat CMP in the morning. Check serum magnesium level.  Foley catheter to remain in place overnight.  Further evaluation management of presenting acute urinary retention, as outlined below, including urology consult.  Lactated Ringer 's at 75 cc/h x 10 hours.  Continue Proscar and added Flomax.                      #) Acute urinary retention: In the setting of presenting decline in urine output, significant urinary retention noted in the ED, with potential attribution from BPH, with immediate output of 2 L of urine following placement of Foley catheter in the ED. it appears that his urinary retention has resulted in AKI on CKD 3B, with interval development of left-sided hydronephrosis, and with this imaging also demonstrating evidence of enlarged prostate.  He has a documented history of BPH, and is currently Proscar.  Will add Flomax to this, and follow for additional recommendations of ensuing urology consult, with urology planning to see the patient in the morning, as above.  Today's CT abdomen/pelvis showed no evidence of mass or additional obstructive process.  Does not appear to be on any  anticholinergic agents that would further promote urinary retention.  No history of any neurogenic bladder.  Plan: Foley catheter to remain in place overnight.  Monitor strict I's and O's and daily weights.  General IV fluids overnight, as above.  Repeat CMP, CBC in the morning.  Continue  Proscar.  Start Flomax.  Urology to formally consult to the patient in the morning, as above.                    #) Acute cystitis: in the setting of recent development of suprapubic tenderness, presenting urinalysis appears consistent with infection in the setting of significant pyuria, large leukocyte esterase, no evidence of succumbs epithelial cells to suggest a contaminated specimen, in tandem with leukocytosis with neutrophil predominance..  This appears to be in the context of urine stasis as a result of acute urinary retention, as above.  There is also suggestion of ascending urinary tract infection, with today's renal stone study showing evidence of interval development of moderate left perinephric stranding, corresponding to the patient's report of left-sided flank discomfort, without any evidence of ureteral stone.   While presenting levels of come to his neutrophilic predominance, there are no additional SIRS arteria met at this time.  Consequently, criteria for sepsis are not currently met.  Presenting lactate nonelevated at 0.9.  He appears hemodynamically stable, without any evidence of hypotension.  Blood cultures x 2 and urine culture were collected prior to initiation of Rocephin in the ED, which will be continued as empiric coverage of complicated urinary tract infection.   Plan: Lactated Ringer 's at 75 cc/h x 10 hours.  Follow-up results blood cultures x 2 as well as urine culture.  Continue Rocephin.  Repeat CBC with differential in the morning.  Monitor strict I's and O's and daily weights.  Further evaluation and management of acute urinary retention, as  above.                    #) Hyperkalemia: Mildly elevated presenting potassium level of 5.5, which appears to be as a consequence of presenting acute renal failure superimposed on CKD 3B, as above.  Presenting EKG does not show any evidence of QTc changes that would correspond with elevated potassium level.  Given alleviation of the patient's postrenal obstruction via Foley catheter, it is anticipated that patient's renal function will subsequently improve, leading to reduction in potassium level, but will closely monitor ensuing potassium trend, with repeat to occur over the next few hours.  If no significant improvement in patient's elevated potassium, will pursue additional medical management CMP, including consideration for Lokelma at that time.   Plan: Further evaluation management of presenting acute renal failure on CKD 3B along with additional evaluation and management of acute urinary retention, including maintaining Foley catheter overnight, with urology consult to occur in the morning.  Monitor on telemetry.  CMP in the morning.                     #) Anion gap metabolic acidosis: As noted on today's CMP, which appears to be on the basis of acute renal failure superimposed on CKD 3B, as above.  Is also noted that the patient presents with urinary tract infection, SIRS criteria are not met for sepsis at this time.  Hepatic synthetic function appears preserved, with INR 1.2.  No evidence to suggest DKA, noting presenting blood sugar of 98, with patient possessing no known history of underlying diabetes.  Will pursue additional evaluation management of acute renal failure with further trending of the patient's anion gap metabolic acidosis, as below.  Plan: Further evaluation management of acute renal failure, as above.  Repeat CMP in the morning.  Further evaluation management of presenting urinary tract infection, as outlined above.  Monitor strict  I's and O's  and daily weights.                     #) History of severe aortic stenosis: Status post TAVR on 05/30/2024.  Echocardiogram performed on postop day #1, 05/31/2024, showed LVEF 55 to 60%, no evidence of focal motion normalities, grade 1 diastolic dysfunction, normal right ventricular systolic function, severely dilated left atrium, mild mitral regurgitation and showed findings consistent with status post TAVR, with mild periventricular regurgitation.  Plan: Monitor strict I's and O's and daily weights.                      #) Anemia of chronic disease: Documented history of such, a/w with baseline hgb range 10-13, with presenting hgb consistent with this range. Will continue to monitor ensuing hemoglobin trend, Including in the context of evidence of hemorrhagic cystitis, as above.  INR found to be nonelevated at 1.2.  Plan: Repeat CBC in the morning.  Add on iron studies.  Check type and screen.  Further evaluation management of presenting acute cystitis, as above.        DVT prophylaxis: SCD's   Code Status: Full code Family Communication: patient's son noted to asleep in bedside recliner;  Disposition Plan: Per Rounding Team Consults called: EDP d/w on-call urology, Dr. Shane, who will consult and see patient in the AM at [facility]. ;  Admission status: inpatient     I SPENT GREATER THAN 75  MINUTES IN CLINICAL CARE TIME/MEDICAL DECISION-MAKING IN COMPLETING THIS ADMISSION.      Eva NOVAK Ragan Reale DO [facility]  From 7PM - 7AM   06/12/2024, 12:46 AM

## 2024-06-12 NOTE — Progress Notes (Signed)
 Brief progress note: - Admitted earlier today.  As per H&P done on admission: Drew Adams is a 83 y.o. male with medical history significant for severe aortic stenosis status post TAVR on 05/30/2024, BPH, CKD 3B with baseline creatinine 1.3-2.0, anemia of chronic disease with baseline hemoglobin 10-13, who is admitted to [facility] on 06/11/2024 with acute renal failure in the setting of acute urinary retention, after presenting from home to [facility] ED complaining of diminished urine output.    In the setting of a history of severe aortic stenosis, the patient underwent TAVR on 05/30/2024.  Subsequent to this procedure, he has noted diminished urine output, with some suprapubic discomfort, and more recently development of left-sided flank pain.  Denies any associated subjective fever, chills, rigors, or generalized myalgias.  No recent chest pain, shortness of breath, cough.   He has a history of stage IIIb CKD with baseline creatinine range 1.3-2.0, with most recent prior creatinine data point noted to be 1.96 on 05/31/2024, corresponding with postop day 1 status post TAVR.  He also has a history of BPH, for which he is prescribed Proscar.         ED Course:  Vital signs in the ED were notable for the following: Afebrile; heart rates in the 70s to 80s; systolic blood pressures in the 130s to 140s; respiratory rate 18-20, oxygen saturation 100% on room air.   Labs were notable for the following: CMP was notable for the following: Sodium 137, potassium 5.5, bicarbonate 13, anion gap 26, creatinine 13.70, glucose 98, calcium  adjusted for mild hypoalbuminemia noted to be 9.8, avidin 2.8, total bilirubin 1.8.  Otherwise, liver enzymes were within normal limits.  Lactic acid 0.9.  CBC notable for white blood cell count 15,600 with 84% neutrophils, hemoglobin 9.7 associated Neuraceq/Norocarp properties as well as nonelevated RDW and relative to most recent prior hemoglobin data point of 10.2 on  06/08/2024 [place] count 234.  INR 1.2.  Urinalysis associated with a cloudy, yellow appearing specimen and notable for greater than 50 white blood cells, large leukocyte esterase, no evidence of succumbs epithelial cells, also showing large hemoglobin with greater than 50 red blood cells and no protein.  Blood cultures x 2 as well as urine culture were collected prior to initiation of IV antibiotics.   Postvoid residual bladder scan in the ED showed greater than 800 cc.  Foley catheter was subsequently placed in the ED, with immediate output of 2 L of urine.  Subsequently, the patient has put out greater than 1 L of additional urine via the Foley catheter.   Per my interpretation, EKG in ED demonstrated the following: Interpretation of this EKG was limited by the presence of motion artifact.  However, within these confines, appears to show sinus rhythm with heart rate 75, QTc was noted to be 493, and no evidence of T wave inversion or ST changes noted, including no evidence of ST elevation.   Imaging in the ED, per corresponding formal radiology read, was notable for the following: CT renal stone study, in comparison to CT abdomen/pelvis performed on 04/11/2024, showed interval development of mild left hydronephrosis to the level of the left ureterovesicular junction, potentially due to bladder outlet obstruction, with enlargement of prostate noted.  Additionally, today CT renal stone study showed interval development of moderate left perinephric stranding, in the absence of any evidence of ureteral stone.  Today's imaging also showed multiple 2 to 3 mm bladder calculi, which appear unchanged from CT abdomen/pelvis on 04/11/2024,  with today's CT renal stone study also showing small right and trace left lateral effusions.   EDP d/w on-call urology, Dr. Shane, who will consult and see patient in the AM at [facility].    While in the ED, the following were administered: Normal saline x 1 L bolus, Rocephin.    Subsequently, the patient was admitted for further evaluation and management of presenting acute renal failure superimposed on CKD 3B in the setting of acute urinary retention with presentation also notable for urinary tract infection, mild hyperkalemia and anion gap metabolic acidosis.   06/12/2024: Patient seen alongside patient's son-in-law.  No new complaints.  Nephrology team consulted.  Follow renal function and electrolytes.  Strict input and input.  Monitor closely for postobstructive diuresis.  Further management depend on hospital course.

## 2024-06-12 NOTE — ED Notes (Signed)
 Cleansed patient and placed in new brief. Provider aware foley output frank red. Foley appears to be draining slowly when irrigating - does not immediately drain but will slowly flow out. Provider aware.   Another foley was attempted to be placed a 22, but patient anatomy too small. Replaced with another 16. 2 RN witnessed.

## 2024-06-12 NOTE — Consult Note (Signed)
 I have been asked to see the patient by Dr. Bernarda Pereyra, for evaluation and management of urinary retention.  History of present illness: 83 year old male who was seen by [facility] for elevated PSA and BPH by Dr. Renda has not been seen since 2023.  Patient presented to the ER for inability to urinate.  Patient is a poor historian but states he has not been able to urinate for 1 day.  Catheter was placed in triage and  drained over 1.9 L of urine.  Patient recently had a transcatheter aortic valve replacement on 05/30/24 he was discharged on postop day 1.  Prior to that the patient denies any nausea vomiting fevers or chills says he has been leaking requiring depends at home.  Upon his arrival to the ER his creatinine was 13.7.  CT demonstrated a distended bladder with a very large prostate with large median lobe multiple small bladder stones as well as left hydronephrosis with mild bilateral perirenal stranding.  Catheter was placed some mild gross hematuria resulted.  Per patient he remains on finasteride but is not on any alpha-blocker he takes a prostate supplement medication  Medical history includes aortic stenosis, BPH, diverticulosis, ED, gout, hypertension, mitral valve disease, HLD,   Review of systems: A 12 point comprehensive review of systems was obtained and is negative unless otherwise stated in the history of present illness.  Patient Active Problem List   Diagnosis Date Noted   Acute renal failure superimposed on stage 3b chronic kidney disease (HCC) 06/12/2024   Acute urinary retention 06/12/2024   BPH (benign prostatic hyperplasia) 06/12/2024   Acute cystitis 06/12/2024   Leukocytosis 06/12/2024   Hyperkalemia 06/12/2024   High anion gap metabolic acidosis 06/12/2024   Anemia of chronic disease 06/12/2024   Coronary artery disease involving native coronary artery of native heart without angina pectoris 06/06/2024   Chronic heart failure with preserved ejection  fraction (HFpEF) (HCC) 06/06/2024   Aortic stenosis, severe 06/06/2024   History of transcatheter aortic valve replacement (TAVR) 05/30/2024   Thoracic aortic aneurysm (TAA)    Hypertension    Mixed hyperlipidemia    Mitral valve disease    Aortic valve disease    TIA (transient ischemic attack) 02/14/2019   CKD (chronic kidney disease) stage 2, GFR 60-89 ml/min 12/28/2012   Adenomatous polyp of sigmoid colon 11/28/2012   Iron deficiency anemia 11/28/2012   Paraesophageal hiatal hernia 11/28/2012   Diverticulosis of colon without hemorrhage 11/28/2012   Gout     No current facility-administered medications on file prior to encounter.   Current Outpatient Medications on File Prior to Encounter  Medication Sig Dispense Refill   allopurinol  (ZYLOPRIM ) 300 MG tablet Take 300 mg by mouth daily.     amoxicillin (AMOXIL) 500 MG capsule Take 4 capsules by mouth 30 min to 1 hour prior to dental procedures 4 capsule 2   Ascorbic Acid  (VITAMIN C ) 1000 MG tablet Take 1,000 mg by mouth daily.     aspirin 81 MG chewable tablet Chew 1 tablet (81 mg total) by mouth daily.     calcium  citrate (CALCITRATE - DOSED IN MG ELEMENTAL CALCIUM ) 950 MG tablet Take 600 mg by mouth daily.     Cholecalciferol (VITAMIN D3) 10000 UNITS capsule Take 10,000 Units by mouth daily. 10000units 3x per week, 5000units 4x per week.     cyanocobalamin (VITAMIN B12) 1000 MCG tablet Take 1,000 mcg by mouth daily.     finasteride (PROSCAR) 5 MG tablet Take 5 mg by  mouth daily.     hydrocortisone cream 1 % Apply 1 Application topically daily.     MAGNESIUM GLYCINATE PO Take 400 mg by mouth daily.     meclizine  (ANTIVERT ) 25 MG tablet Take 1 tablet (25 mg total) by mouth 3 (three) times daily as needed for dizziness. 30 tablet 0   Multiple Vitamin (MULTIVITAMIN WITH MINERALS) TABS Take 1 tablet by mouth daily. Mature     OVER THE COUNTER MEDICATION Take 1 tablet by mouth daily. prostacor     predniSONE  (DELTASONE ) 5 MG tablet  Take 5 mg by mouth daily as needed (Gout).     psyllium (METAMUCIL) 58.6 % packet Take 1 packet by mouth daily. 10 ml     PURE L-ARGININE HCL PO Take 1,800 mg by mouth daily.     Zinc  Gluconate 15 MG TABS Take 50 mg by mouth daily. Plus copper 2 mg      Past Medical History:  Diagnosis Date   Aortic stenosis    s/p TAVR on 05/30/24   Aortic valve disease    BPH (benign prostatic hyperplasia)    Diverticulosis of colon without hemorrhage 11/28/2012   ED (erectile dysfunction)    Elevated PSA    Gout    uses prednisone  1-2x/week   Hypertension    Iron deficiency anemia 11/28/2012   Mitral valve disease    Mixed hyperlipidemia    Ocular migraine    Paraesophageal hiatal hernia 11/28/2012   Radiculopathy    Thoracic aortic aneurysm (TAA)     Past Surgical History:  Procedure Laterality Date   CATARACT EXTRACTION Right 2012   CATARACT EXTRACTION Left Jan 2014   CORONARY ANGIOGRAPHY N/A 05/03/2024   Procedure: CORONARY ANGIOGRAPHY;  Surgeon: Verlin Lonni BIRCH, MD;  Location: [facility];  Service: Cardiovascular;  Laterality: N/A;   HERNIA REPAIR Right 1970s   open RIH repair   INTRAOPERATIVE TRANSTHORACIC ECHOCARDIOGRAM N/A 05/30/2024   Procedure: ECHOCARDIOGRAM, TRANSTHORACIC;  Surgeon: Wendel Lurena POUR, MD;  Location: [facility];  Service: Cardiovascular;  Laterality: N/A;   LAPAROSCOPIC PARTIAL COLECTOMY N/A 12/27/2012   Procedure: LAPAROSCOPIC  PARTIAL COLECTOMY RIGID PROCTOSCOPY ;  Surgeon: Elspeth KYM Schultze, MD;  Location: [facility];  Service: General;  Laterality: N/A;   LAPAROSCOPIC SIGMOID COLECTOMY  12/27/2012   TONSILLECTOMY AND ADENOIDECTOMY      Social History   Tobacco Use   Smoking status: Never   Smokeless tobacco: Never  Vaping Use   Vaping status: Never Used  Substance Use Topics   Alcohol use: No   Drug use: No    Family History  Problem Relation Age of Onset   Heart attack Mother    CAD Mother    Parkinson's disease Father     Diabetes Father    Cancer Sister    Diabetes Sister     PE: Vitals:   06/11/24 2200 06/12/24 0130 06/12/24 0500 06/12/24 0600  BP: (!) 130/52 (!) 127/59 (!) 117/52 (!) 117/58  Pulse: 74 89 91 87  Resp: 12 11 13 13   Temp:      TempSrc:      SpO2: 100% 100% 97% 100%   General: No acute distress Respiratory: Normal breathing room air Cardiovascular: Regular rate and rhythm Abdomen: Soft nontender nondistended GU: 16 French Foley in place draining Kool-Aid colored urine with clear some clots that passed into the bag but draining well no need for irrigation.  Recent Labs    06/11/24 1720 06/12/24 0636  WBC  15.6* 10.2  HGB 9.7* 9.1*  HCT 29.5* 28.1*   Recent Labs    06/11/24 1720  NA 137  K 5.5*  CL 98  CO2 13*  GLUCOSE 98  BUN 196*  CREATININE 13.70*  CALCIUM  8.8*   Recent Labs    06/11/24 1720  INR 1.2   No results for input(s): LABURIN in the last 72 hours. Results for orders placed or performed during the hospital encounter of 05/26/24  Surgical pcr screen     Status: None   Collection Time: 05/26/24  9:15 AM   Specimen: Nasal Mucosa; Nasal Swab  Result Value Ref Range Status   MRSA, PCR NEGATIVE NEGATIVE Final   Staphylococcus aureus NEGATIVE NEGATIVE Final    Comment: (NOTE) The Xpert SA Assay (FDA approved for NASAL specimens in patients 85 years of age and older), is one component of a comprehensive surveillance program. It is not intended to diagnose infection nor to guide or monitor treatment. Performed at [facility], [address] [place], [place] [address]     Imaging: CT  IMPRESSION: 1. Small right and trace left pleural effusions, new from prior examination 2. Mild left hydronephrosis to the level of the left ureterovesicular junction, possibly impinged by enlarged central prostatic lobe, new from prior examination; moderate left perinephric stranding, correlate for urinary tract infection versus residual changes from a  recently passed ureteral calculus 3. Numerous 23 mm dependent bladder calculi, similar to prior; punctate 12 mm nonobstructing bilateral renal calculi; no right hydronephrosis and no right ureteral calculus 4. raf score: Aortic atherosclerosis ([id number])  Imp: 83 year old male with urinary retention resulting in severe AKI and left-sided hydronephrosis.  Catheter placed in ER with significant urine output drainage.  Urinary retention likely secondary to large prostate with recent anesthesia contributing to retention.  Recommendations: # Urinary retention - Continue catheter - Continue finasteride - Start patient on Flomax if blood pressure allows - Patient should keep catheter for at least 1 to 2 weeks  # Concern for postobstructive diuresis - After catheter placement patient had 1.9 L of urine output he has had over 3.3 L of urine output since catheter placement concern for postobstructive diuresis - Recommend replacing 80% of fluids lost in urine - Check electrolytes every 6 hours and replace as needed  # Gross hematuria - Continue to observe - No need for irrigation   Thank you for involving me in this patient's care, I will continue to follow along.Please page with any further questions or concerns. Steffan JAYSON Pea

## 2024-06-12 NOTE — ED Notes (Signed)
 Called [facility]cmd for pt.

## 2024-06-12 NOTE — ED Notes (Signed)
 Assuming pt care, pt bib ems coming from home for urinary retention x 3 days, urinary incontinence x 2 weeks since having a stent procedure, pt aaox4, mae, bruising to arms and groin area, foley catheter noted, changed today output at this time. Pt denies pain/discomfort. Family at bedside call bell within reach.

## 2024-06-12 NOTE — Consult Note (Signed)
 Nephrology Consult   Requesting provider: Leatrice [facility] Service requesting consult: Hospitalist Reason for consult: AKI on CKD 3b   Assessment/Recommendations: Drew Adams is a/an 83 y.o. male with a past medical history aortic stenosis status post TAVR on 05/30/2024, BPH, CKD 3B, dementia  who present w/ severe AKI on CKD 3b   Severe nonoliguric AKI on CKD 3B secondary to bladder outlet obstruction: Likely associated with BPH and possibly some irritation related to recent procedure and likely Foley catheter at that time.  Creatinine already improving -Continue with Foley catheter in place -Continue with IV fluids -Continue to monitor daily Cr, Dose meds for GFR -Monitor Daily I/Os, Daily weight  -Maintain MAP>65 for optimal renal perfusion.  -Avoid nephrotoxic medications including NSAIDs -Use synthetic opioids (Fentanyl/Dilaudid ) if needed -Currently no indication for HD  Bladder outlet obstruction: Urology following.  Management per them.  On Flomax and Proscar  UTI: Being treated with ceftriaxone.  Continue per primary team  Dementia: Recommend delirium precautions  Metabolic acidosis: Associated with AKI and urinary obstruction.  Continue with care as above  Anemia: Likely multifactorial with hemoglobin of 9.1.  Continue to monitor for now and transfuse as needed    Recommendations conveyed to primary service.    [facility] 06/12/2024 1:56 PM   _____________________________________________________________________________________ CC: Decreased urine output  History of Present Illness: Drew Adams is a/an 83 y.o. male with a past medical history of aortic stenosis status post TAVR on 05/30/2024, BPH, CKD 3B, dementia who presents with decreased urine output.  Patient underwent TAVR on 05/30/2024.  Reportedly the procedure went well.  Since being home the patient has complained about urinary incontinence.  I saw outpatient cardiology  and discussed this problem recently but the patient was unable to void and give them a urine sample.  They knew about some enlarged prostate but he has never had a problem like this before.  The patient is demented and has a hard time providing some of the details of his history but denies any recent fevers, chills, shortness of breath, chest pain, nausea, vomiting, diarrhea.  He has had some abdominal discomfort particularly in the suprapubic region.  History of kidney disease with creatinine fluctuating between 1.3 and 2.  Creatinine was around 2 on 05/31/2024.  In the emergency department he was found to have elevated potassium and acidosis as well as creatinine of nearly 14.  Patient had Foley catheter placed with 2 L of urine being made immediately.  CT scan was also suggestive of some kidney stones as well as bladder outlet obstruction.  Patient was seen by urology as well.   Medications:  Current Facility-Administered Medications  Medication Dose Route Frequency Provider Last Rate Last Admin   acetaminophen  (TYLENOL ) tablet 650 mg  650 mg Oral Q6H PRN Howerter, Justin B, DO       Or   acetaminophen  (TYLENOL ) suppository 650 mg  650 mg Rectal Q6H PRN Howerter, Justin B, DO       cefTRIAXone (ROCEPHIN) 1 g in sodium chloride  0.9 % 100 mL IVPB  1 g Intravenous Q24H Howerter, Justin B, DO       finasteride (PROSCAR) tablet 5 mg  5 mg Oral Daily Howerter, Justin B, DO   5 mg at 06/12/24 1025   melatonin tablet 3 mg  3 mg Oral QHS PRN Howerter, Justin B, DO       ondansetron  (ZOFRAN ) injection 4 mg  4 mg Intravenous Q6H PRN Howerter, Justin B, DO  tamsulosin (FLOMAX) capsule 0.4 mg  0.4 mg Oral Daily Howerter, Justin B, DO   0.4 mg at 06/12/24 1025   Current Outpatient Medications  Medication Sig Dispense Refill   allopurinol  (ZYLOPRIM ) 300 MG tablet Take 300 mg by mouth daily.     amoxicillin (AMOXIL) 500 MG capsule Take 4 capsules by mouth 30 min to 1 hour prior to dental procedures 4  capsule 2   Ascorbic Acid  (VITAMIN C ) 1000 MG tablet Take 1,000 mg by mouth daily.     aspirin 81 MG chewable tablet Chew 1 tablet (81 mg total) by mouth daily.     calcium  citrate (CALCITRATE - DOSED IN MG ELEMENTAL CALCIUM ) 950 MG tablet Take 600 mg by mouth daily.     cyanocobalamin (VITAMIN B12) 1000 MCG tablet Take 1,000 mcg by mouth daily.     finasteride (PROSCAR) 5 MG tablet Take 5 mg by mouth daily.     hydrocortisone cream 1 % Apply 1 Application topically daily.     MAGNESIUM GLYCINATE PO Take 400 mg by mouth daily.     meclizine  (ANTIVERT ) 25 MG tablet Take 1 tablet (25 mg total) by mouth 3 (three) times daily as needed for dizziness. 30 tablet 0   Multiple Vitamin (MULTIVITAMIN WITH MINERALS) TABS Take 1 tablet by mouth daily. Mature     OVER THE COUNTER MEDICATION Take 1 tablet by mouth daily. prostacor     predniSONE  (DELTASONE ) 5 MG tablet Take 5 mg by mouth daily as needed (Gout).     psyllium (METAMUCIL) 58.6 % packet Take 1 packet by mouth daily. 10 ml     PURE L-ARGININE HCL PO Take 1,800 mg by mouth daily.     Zinc  Gluconate 15 MG TABS Take 50 mg by mouth daily. Plus copper 2 mg       ALLERGIES Crestor  [rosuvastatin ], Exforge [amlodipine besylate-valsartan], and Simvastatin  MEDICAL HISTORY Past Medical History:  Diagnosis Date   Aortic stenosis    s/p TAVR on 05/30/24   Aortic valve disease    BPH (benign prostatic hyperplasia)    Diverticulosis of colon without hemorrhage 11/28/2012   ED (erectile dysfunction)    Elevated PSA    Gout    uses prednisone  1-2x/week   Hypertension    Iron deficiency anemia 11/28/2012   Mitral valve disease    Mixed hyperlipidemia    Ocular migraine    Paraesophageal hiatal hernia 11/28/2012   Radiculopathy    Thoracic aortic aneurysm (TAA)      SOCIAL HISTORY Social History   Socioeconomic History   Marital status: Widowed    Spouse name: Aldona   Number of children: 2   Years of education: Not on file   Highest  education level: Bachelor's degree (e.g., BA, AB, BS)  Occupational History   Occupation: retired   Tobacco Use   Smoking status: Never   Smokeless tobacco: Never  Vaping Use   Vaping status: Never Used  Substance and Sexual Activity   Alcohol use: No   Drug use: No   Sexual activity: Not on file  Other Topics Concern   Not on file  Social History Narrative   Right handed   Caffeine none   Lives tat home with spouse Aldona    Social Drivers of Health   Financial Resource Strain: Not on file  Food Insecurity: No Food Insecurity (06/12/2024)   Hunger Vital Sign    Worried About Running Out of Food in the Last Year: Never true  Ran Out of Food in the Last Year: Never true  Transportation Needs: No Transportation Needs (06/12/2024)   PRAPARE - Administrator, Civil Service (Medical): No    Lack of Transportation (Non-Medical): No  Physical Activity: Not on file  Stress: Not on file  Social Connections: Socially Isolated (06/12/2024)   Social Connection and Isolation Panel    Frequency of Communication with Friends and Family: Once a week    Frequency of Social Gatherings with Friends and Family: Once a week    Attends Religious Services: Never    Database Administrator or Organizations: No    Attends Banker Meetings: Never    Marital Status: Widowed  Intimate Partner Violence: Not At Risk (06/12/2024)   Humiliation, Afraid, Rape, and Kick questionnaire    Fear of Current or Ex-Partner: No    Emotionally Abused: No    Physically Abused: No    Sexually Abused: No     FAMILY HISTORY Family History  Problem Relation Age of Onset   Heart attack Mother    CAD Mother    Parkinson's disease Father    Diabetes Father    Cancer Sister    Diabetes Sister       Review of Systems: 12 systems reviewed Otherwise as per HPI, all other systems reviewed and negative  Physical Exam: Vitals:   06/12/24 0830 06/12/24 1100  BP: (!) 126/51 (!) 127/52   Pulse: 90 86  Resp: 15 10  Temp: 98 F (36.7 C) 97.8 F (36.6 C)  SpO2: 100% 100%   Total I/O In: -  Out: 2800 [Urine:2800]  Intake/Output Summary (Last 24 hours) at 06/12/2024 1356 Last data filed at 06/12/2024 1212 Gross per 24 hour  Intake --  Output 6175 ml  Net -6175 ml   General: well-appearing, no acute distress HEENT: anicteric sclera, oropharynx clear without lesions CV: Normal rate, no rub, no lower extremity edema Lungs: clear to auscultation bilaterally, normal work of breathing Abd: soft, only tender, non-distended Skin: no visible lesions or rashes Psych: alert, engaged, appropriate mood and affect Musculoskeletal: no obvious deformities Neuro: normal speech, oriented person but not place time and situation  Test Results Reviewed Lab Results  Component Value Date   NA 144 06/12/2024   K 4.2 06/12/2024   CL 112 (H) 06/12/2024   CO2 14 (L) 06/12/2024   BUN 157 (H) 06/12/2024   CREATININE 8.87 (H) 06/12/2024   CALCIUM  8.4 (L) 06/12/2024   ALBUMIN 2.4 (L) 06/12/2024    CBC Recent Labs  Lab 06/08/24 1122 06/11/24 1720 06/12/24 0636  WBC 13.0* 15.6* 10.2  NEUTROABS 10.1* 13.2* 8.4*  HGB 10.2* 9.7* 9.1*  HCT 31.4* 29.5* 28.1*  MCV 93 92.8 93.0  PLT 167 234 201    I have reviewed all relevant outside healthcare records related to the patient's current hospitalization

## 2024-06-12 NOTE — ED Notes (Signed)
 PT brief changed at this time. Pillow placed under left side. Breathing is even and unlabored.  Support person at bedside.

## 2024-06-12 NOTE — ED Notes (Signed)
Report given to Yellow RN 

## 2024-06-13 DIAGNOSIS — N179 Acute kidney failure, unspecified: Secondary | ICD-10-CM | POA: Diagnosis not present

## 2024-06-13 DIAGNOSIS — N1831 Chronic kidney disease, stage 3a: Secondary | ICD-10-CM | POA: Diagnosis not present

## 2024-06-13 LAB — RENAL FUNCTION PANEL
Albumin: 2.5 g/dL — ABNORMAL LOW (ref 3.5–5.0)
Anion gap: 16 — ABNORMAL HIGH (ref 5–15)
BUN: 89 mg/dL — ABNORMAL HIGH (ref 8–23)
CO2: 19 mmol/L — ABNORMAL LOW (ref 22–32)
Calcium: 8.7 mg/dL — ABNORMAL LOW (ref 8.9–10.3)
Chloride: 106 mmol/L (ref 98–111)
Creatinine, Ser: 3.66 mg/dL — ABNORMAL HIGH (ref 0.61–1.24)
GFR, Estimated: 16 mL/min — ABNORMAL LOW (ref >60)
Glucose, Bld: 97 mg/dL (ref 70–99)
Phosphorus: 4.1 mg/dL (ref 2.5–4.6)
Potassium: 3.9 mmol/L (ref 3.5–5.1)
Sodium: 141 mmol/L (ref 135–145)

## 2024-06-13 LAB — URINE CULTURE: Culture: NO GROWTH

## 2024-06-13 MED ORDER — IPRATROPIUM-ALBUTEROL 0.5-2.5 (3) MG/3ML IN SOLN: 3.0000 mL | RESPIRATORY_TRACT | Status: DC | PRN

## 2024-06-13 MED ORDER — HYDRALAZINE HCL 20 MG/ML IJ SOLN: 10.0000 mg | INTRAMUSCULAR | Status: DC | PRN

## 2024-06-13 MED ORDER — VITAMIN C 500 MG PO TABS
1000.0000 mg | ORAL_TABLET | Freq: Every day | ORAL | Status: DC
  Administered 2024-06-13 – 2024-06-16 (×4): 1000 mg via ORAL
  Filled 2024-06-13 (×4): qty 2

## 2024-06-13 MED ORDER — GLUCAGON HCL RDNA (DIAGNOSTIC) 1 MG IJ SOLR: 1.0000 mg | INTRAMUSCULAR | Status: DC | PRN

## 2024-06-13 MED ORDER — SODIUM CHLORIDE 0.9 % IV SOLN: INTRAVENOUS | Status: DC

## 2024-06-13 MED ORDER — SODIUM CHLORIDE 0.9 % IV SOLN
INTRAVENOUS | Status: DC
Start: 2024-06-13 — End: 2024-06-15

## 2024-06-13 MED ORDER — SENNOSIDES-DOCUSATE SODIUM 8.6-50 MG PO TABS
1.0000 | ORAL_TABLET | Freq: Every evening | ORAL | Status: DC | PRN
  Administered 2024-06-13: 1 via ORAL
  Filled 2024-06-13: qty 1

## 2024-06-13 MED ORDER — CHLORHEXIDINE GLUCONATE CLOTH 2 % EX PADS
6.0000 | MEDICATED_PAD | Freq: Every day | CUTANEOUS | Status: DC
  Administered 2024-06-13 – 2024-06-15 (×3): 6 via TOPICAL

## 2024-06-13 MED ORDER — VITAMIN B-12 1000 MCG PO TABS
1000.0000 ug | ORAL_TABLET | Freq: Every day | ORAL | Status: DC
  Administered 2024-06-13 – 2024-06-16 (×4): 1000 ug via ORAL
  Filled 2024-06-13 (×4): qty 1

## 2024-06-13 MED ORDER — METOPROLOL TARTRATE 5 MG/5ML IV SOLN: 5.0000 mg | INTRAVENOUS | Status: DC | PRN

## 2024-06-13 NOTE — Progress Notes (Signed)
   06/13/24 1457  Mobility  Activity Ambulated with assistance  Level of Assistance Contact guard assist, steadying assist  Assistive Device Front wheel walker  Distance Ambulated (ft) 150 ft  Activity Response Tolerated fair  Mobility Referral Yes  Mobility visit 1 Mobility  Mobility Specialist Start Time (ACUTE ONLY) 1457  Mobility Specialist Stop Time (ACUTE ONLY) 1511  Mobility Specialist Time Calculation (min) (ACUTE ONLY) 14 min   Mobility Specialist: Progress Note  Pt agreeable to mobility session - received in chair. Pt was asymptomatic throughout session with no complaints. Returned to chair with all needs met - call bell within reach.   Additional comments: Son in social worker and case manager present.   Drew Adams, BS Mobility Specialist Please contact via SecureChat or  Rehab office at [phone].

## 2024-06-13 NOTE — Plan of Care (Signed)
   Problem: Education: Goal: Knowledge of General Education information will improve Description: Including pain rating scale, medication(s)/side effects and non-pharmacologic comfort measures Outcome: Progressing   Problem: Health Behavior/Discharge Planning: Goal: Ability to manage health-related needs will improve Outcome: Progressing   Problem: Activity: Goal: Risk for activity intolerance will decrease Outcome: Progressing

## 2024-06-13 NOTE — Progress Notes (Signed)
 Nephrology Follow-Up Consult note   Assessment/Recommendations: Drew Adams is a/an 83 y.o. male with a past medical history significant for aortic stenosis status post TAVR on 05/30/2024, BPH, CKD 3B, dementia  who present w/ severe AKI on CKD 3b    Severe nonoliguric AKI on CKD 3B secondary to bladder outlet obstruction: Likely associated with BPH and possibly some irritation related to recent procedure and likely Foley catheter at that time.  Creatinine has improved significantly -Continue with Foley catheter in place -Continue with IV fluids -Continue to monitor daily Cr, Dose meds for GFR -Monitor Daily I/Os, Daily weight  -Maintain MAP>65 for optimal renal perfusion.  -Avoid nephrotoxic medications including NSAIDs -Use synthetic opioids (Fentanyl /Dilaudid ) if needed -Currently no indication for HD  We will sign off at this time.  Can contact us  if further help is needed.  Could see nephrology outpatient if he desires but given age not definitively necessary   Bladder outlet obstruction: Urology following.  Management per them.  On Flomax  and Proscar    UTI: Being treated with ceftriaxone .  Continue per primary team   Dementia: Recommend delirium precautions     Anemia: Likely multifactorial.  Transfusions and further workup per primary team   Recommendations conveyed to primary service.    [facility] 06/13/2024 1:41 PM  ___________________________________________________________  CC: Decreased urine output  Interval History/Subjective: Patient tired but otherwise no issues   Medications:  Current Facility-Administered Medications  Medication Dose Route Frequency Provider Last Rate Last Admin   0.9 %  sodium chloride  infusion   Intravenous Continuous Amin, Ankit C, MD 100 mL/hr at 06/13/24 1210 New Bag at 06/13/24 1210   acetaminophen  (TYLENOL ) tablet 650 mg  650 mg Oral Q6H PRN Howerter, Justin B, DO       Or   acetaminophen   (TYLENOL ) suppository 650 mg  650 mg Rectal Q6H PRN Howerter, Justin B, DO       ascorbic acid  (VITAMIN C ) tablet 1,000 mg  1,000 mg Oral Daily Amin, Ankit C, MD   1,000 mg at 06/13/24 1204   cefTRIAXone  (ROCEPHIN ) 1 g in sodium chloride  0.9 % 100 mL IVPB  1 g Intravenous Q24H Howerter, Justin B, DO   Stopped at 06/12/24 2207   Chlorhexidine  Gluconate Cloth 2 % PADS 6 each  6 each Topical Daily Jillian Buttery, MD   6 each at 06/13/24 1305   cyanocobalamin  (VITAMIN B12) tablet 1,000 mcg  1,000 mcg Oral Daily Amin, Ankit C, MD   1,000 mcg at 06/13/24 1203   finasteride  (PROSCAR ) tablet 5 mg  5 mg Oral Daily Howerter, Justin B, DO   5 mg at 06/13/24 1028   glucagon  (human recombinant) (GLUCAGEN) injection 1 mg  1 mg Intravenous PRN Amin, Ankit C, MD       hydrALAZINE  (APRESOLINE ) injection 10 mg  10 mg Intravenous Q4H PRN Amin, Ankit C, MD       ipratropium-albuterol  (DUONEB) 0.5-2.5 (3) MG/3ML nebulizer solution 3 mL  3 mL Nebulization Q4H PRN Amin, Ankit C, MD       melatonin tablet 3 mg  3 mg Oral QHS PRN Howerter, Justin B, DO       metoprolol  tartrate (LOPRESSOR ) injection 5 mg  5 mg Intravenous Q4H PRN Amin, Ankit C, MD       ondansetron  (ZOFRAN ) injection 4 mg  4 mg Intravenous Q6H PRN Howerter, Justin B, DO       senna-docusate (Senokot-S) tablet 1 tablet  1 tablet Oral QHS PRN  Caleen Burgess BROCKS, MD       tamsulosin [facility]) capsule 0.4 mg  0.4 mg Oral Daily Howerter, Justin B, DO   0.4 mg at 06/13/24 1028      Review of Systems: 10 systems reviewed and negative except per interval history/subjective  Physical Exam: Vitals:   06/13/24 0928 06/13/24 0931  BP:  (!) 133/55  Pulse:  89  Resp:  14  Temp: 97.9 F (36.6 C) 98.4 F (36.9 C)  SpO2:  99%   Total I/O In: -  Out: 1700 [Urine:1700]  Intake/Output Summary (Last 24 hours) at 06/13/2024 1341 Last data filed at 06/13/2024 1015 Gross per 24 hour  Intake --  Output 5325 ml  Net -5325 ml   Constitutional: w frail, elderly, no  distress ENMT: ears and nose without scars or lesions, MMM CV: normal rate, trace edema Respiratory: clear to auscultation, normal work of breathing Gastrointestinal: soft, non-tender, no palpable masses or hernias Skin: no visible lesions or rashes   Test Results I personally reviewed new and old clinical labs and radiology tests Lab Results  Component Value Date   NA 141 06/13/2024   K 3.9 06/13/2024   CL 106 06/13/2024   CO2 19 (L) 06/13/2024   BUN 89 (H) 06/13/2024   CREATININE 3.66 (H) 06/13/2024   CALCIUM  8.7 (L) 06/13/2024   ALBUMIN 2.5 (L) 06/13/2024   PHOS 4.1 06/13/2024    CBC Recent Labs  Lab 06/08/24 1122 06/11/24 1720 06/12/24 0636  WBC 13.0* 15.6* 10.2  NEUTROABS 10.1* 13.2* 8.4*  HGB 10.2* 9.7* 9.1*  HCT 31.4* 29.5* 28.1*  MCV 93 92.8 93.0  PLT 167 234 201

## 2024-06-13 NOTE — TOC Initial Note (Signed)
 Transition of Care [facility]) - Initial/Assessment Note    Patient Details  Name: Drew Adams MRN: [id number] Date of Birth: 05-31-1941  [facility] CM/SW Contact:    Marval Gell, RN Phone Number: 06/13/2024, 3:33 PM  Clinical Narrative:                  Spoke with patient and son in law in the room and daughter on speaker.  Discussed OT recs for [facility] and they agree with plan, PT still pending.  Reviewed medicare website at bedside with son in law and [facility] chosen, referral accepted, will need HH orders. Entered in [place].  Discussed DME. They would like 3/1 and RW as of now but will await PT eval and recs before ordering DME  Family will transport home   Expected Discharge Plan: Home w Home Health Services Barriers to Discharge: Continued Medical Work up   Patient Goals and CMS Choice Patient states their goals for this hospitalization and ongoing recovery are:: to go home CMS Medicare.gov Compare Post Acute Care list provided to:: Other (Comment Required) Choice offered to / list presented to : Adult Children      Expected Discharge Plan and Services   Discharge Planning Services: CM Consult Post Acute Care Choice: Durable Medical Equipment, Home Health Living arrangements for the past 2 months: Single Family Home                             HH Agency: [facility] Date [facility] Agency Contacted: 06/13/24 Time HH Agency Contacted: 1533 Representative spoke with at [facility] Agency: Arna  Prior Living Arrangements/Services Living arrangements for the past 2 months: Single Family Home     Do you feel safe going back to the place where you live?: Yes               Activities of Daily Living   ADL Screening (condition at time of admission) Independently performs ADLs?: No Is the patient deaf or have difficulty hearing?: No Does the patient have difficulty seeing, even when wearing glasses/contacts?: No Does the patient have difficulty concentrating,  remembering, or making decisions?: Yes  Permission Sought/Granted                  Emotional Assessment              Admission diagnosis:  Bladder calculi [N21.0] Prostatic hypertrophy [N40.0] Acute renal failure (ARF) [N17.9] Acute cystitis with hematuria [N30.01] AKI (acute kidney injury) [N17.9] Acute urinary retention [R33.8] Patient Active Problem List   Diagnosis Date Noted   Acute kidney injury superimposed on stage 3a chronic kidney disease (HCC) 06/12/2024   Acute urinary retention 06/12/2024   BPH (benign prostatic hyperplasia) 06/12/2024   Acute cystitis 06/12/2024   Leukocytosis 06/12/2024   Hyperkalemia 06/12/2024   High anion gap metabolic acidosis 06/12/2024   Anemia of chronic disease 06/12/2024   Coronary artery disease involving native coronary artery of native heart without angina pectoris 06/06/2024   Chronic heart failure with preserved ejection fraction (HFpEF) (HCC) 06/06/2024   Aortic stenosis, severe 06/06/2024   History of transcatheter aortic valve replacement (TAVR) 05/30/2024   Thoracic aortic aneurysm (TAA)    Hypertension    Mixed hyperlipidemia    Mitral valve disease    Aortic valve disease    TIA (transient ischemic attack) 02/14/2019   CKD (chronic kidney disease) stage 2, GFR 60-89 ml/min 12/28/2012   Adenomatous polyp of  sigmoid colon 11/28/2012   Iron deficiency anemia 11/28/2012   Paraesophageal hiatal hernia 11/28/2012   Diverticulosis of colon without hemorrhage 11/28/2012   Gout    PCP:  Stacia Millman, PA Pharmacy:   [facility] [address] [place] - [address] [place] [address] Phone: [phone] Fax: [phone]  [facility] [place]  - [place], [place] - [address] [place] [address] Phone: [phone] Fax: [phone]  [facility] [address] [place] [address] Phone: [phone]  Fax: [phone]     Social Drivers of Health (SDOH) Social History: SDOH Screenings   Food Insecurity: No Food Insecurity (06/12/2024)  Housing: Low Risk  (06/12/2024)  Transportation Needs: No Transportation Needs (06/12/2024)  Utilities: Not At Risk (06/12/2024)  Social Connections: Socially Isolated (06/12/2024)  Tobacco Use: Low Risk  (06/12/2024)   SDOH Interventions:     Readmission Risk Interventions     No data to display

## 2024-06-13 NOTE — Evaluation (Signed)
 Occupational Therapy Evaluation Patient Details Name: Drew Adams MRN: [id number] DOB: 06-26-1941 Today's Date: 06/13/2024   History of Present Illness   83 y.o. male presents w/ severe AKI on CKD. EFY:jnmupr stenosis status post TAVR on 05/30/2024, BPH, CKD 3B, dementia.     Clinical Impressions PTA pt lives alone independently, drives, walks 2 miles/day and goes to synagogue 6 days/wk. Son in law present and states pt's cognitive deficits are baseline. Pt/family state pt has had more difficulty with self care since TAVR. Pt currently able to ambulate and complete ADL tasks with CGA @ RW level due to below listed deficits. Recommend HHOT at DC to maximize functional level of independence. Son in law states family will be able ot assist pt at DC as needed. Acute OT to follow to facilitate safe DC home.      If plan is discharge home, recommend the following:   A little help with walking and/or transfers;A little help with bathing/dressing/bathroom;Assistance with cooking/housework;Assist for transportation     Functional Status Assessment   Patient has had a recent decline in their functional status and demonstrates the ability to make significant improvements in function in a reasonable and predictable amount of time.     Equipment Recommendations   Other (comment);Tub/shower seat (RW)     Recommendations for Other Services         Precautions/Restrictions   Precautions Precautions: Fall     Mobility Bed Mobility Overal bed mobility: Needs Assistance Bed Mobility: Supine to Sit     Supine to sit: Min assist     General bed mobility comments: heavy use of rails    Transfers Overall transfer level: Needs assistance   Transfers: Sit to/from Stand Sit to Stand: Contact guard assist                  Balance Overall balance assessment: Mild deficits observed, not formally tested                                         ADL either  performed or assessed with clinical judgement   ADL Overall ADL's : Needs assistance/impaired Eating/Feeding: Modified independent   Grooming: Set up;Sitting   Upper Body Bathing: Set up;Sitting   Lower Body Bathing: Contact guard assist;Sit to/from stand   Upper Body Dressing : Set up;Sitting   Lower Body Dressing: Contact guard assist;Sit to/from stand   Toilet Transfer: Contact guard assist;Ambulation   Toileting- Clothing Manipulation and Hygiene: Minimal assistance Toileting - Clothing Manipulation Details (indicate cue type and reason): foley     Functional mobility during ADLs: Rolling walker (2 wheels);Contact guard assist       Vision Baseline Vision/History: 0 No visual deficits       Perception         Praxis         Pertinent Vitals/Pain Pain Assessment Pain Assessment: No/denies pain     Extremity/Trunk Assessment Upper Extremity Assessment Upper Extremity Assessment: Generalized weakness (but functional)   Lower Extremity Assessment Lower Extremity Assessment: Generalized weakness   Cervical / Trunk Assessment Cervical / Trunk Assessment: Kyphotic   Communication     Cognition Arousal: Alert Behavior During Therapy: WFL for tasks assessed/performed Cognition: History of cognitive impairments             OT - Cognition Comments: scored 13/28 on the Short Blessed Test, indicating cognitive impairment, especially working  adn STM                 Following commands: Intact       Cueing  General Comments      Discussed recommendation of fall alert system with pt/family   Exercises Exercises: Other exercises Other Exercises Other Exercises: sit - stand x 8 Other Exercises: incentive spirometer x 5 - able to pull > 1750 ml   Shoulder Instructions      Home Living Family/patient expects to be discharged to:: Private residence Living Arrangements: Alone Available Help at Discharge: Family;Available 24 hours/day (for short  term) Type of Home: House Home Access: Stairs to enter [employer] of Steps: 3 Entrance Stairs-Rails: None Home Layout: Multi-level Alternate Level Stairs-Number of Steps: 6 (up/down; split level) Alternate Level Stairs-Rails: Right Bathroom Shower/Tub: Tub/shower unit;Door   [employer] Toilet: Standard Bathroom Accessibility: Yes (sideways) How Accessible: Accessible via walker Home Equipment: None (may have a RW)          Prior Functioning/Environment Prior Level of Function : Driving;Independent/Modified Independent               ADLs Comments: since TAVR, family reports more lethargic adn more difficulty with ADL/IADL tasks    OT Problem List: Decreased strength;Decreased activity tolerance   OT Treatment/Interventions: Self-care/ADL training;Therapeutic exercise;Energy conservation;DME and/or AE instruction;Therapeutic activities;Patient/family education      OT Goals(Current goals can be found in the care plan section)   Acute Rehab OT Goals Patient Stated Goal: get stronger OT Goal Formulation: With patient/family Time For Goal Achievement: 06/27/24 Potential to Achieve Goals: Good   OT Frequency:  Min 2X/week    Co-evaluation              AM-PAC OT 6 Clicks Daily Activity     Outcome Measure Help from another person eating meals?: None Help from another person taking care of personal grooming?: A Little Help from another person toileting, which includes using toliet, bedpan, or urinal?: A Little Help from another person bathing (including washing, rinsing, drying)?: A Little Help from another person to put on and taking off regular upper body clothing?: A Little Help from another person to put on and taking off regular lower body clothing?: A Little 6 Click Score: 19   End of Session Equipment Utilized During Treatment: Gait belt;Rolling walker (2 wheels) Nurse Communication: Mobility status  Activity Tolerance: Patient tolerated  treatment well Patient left: in chair;with call bell/phone within reach;with family/visitor present  OT Visit Diagnosis: Unsteadiness on feet (R26.81);Muscle weakness (generalized) (M62.81);Other symptoms and signs involving cognitive function                Time: 1339-1413 OT Time Calculation (min): 34 min Charges:  OT General Charges $OT Visit: 1 Visit OT Evaluation $OT Eval Moderate Complexity: 1 Mod OT Treatments $Self Care/Home Management : 8-22 mins  Kreg Sink, OT/L   Acute OT Clinical Specialist Acute Rehabilitation Services Pager [phone] Office [phone]   [facility] 06/13/2024, 2:36 PM

## 2024-06-13 NOTE — Hospital Course (Addendum)
 Brief Narrative:  83 year old with history of severe aortic stenosis status post TAVR 11//25, BPH, CKD 3B, anemia of chronic disease admitted to [facility] for acute renal failure in the setting of severe urinary retention requiring Foley catheter placement.  Patient was also found to have BPH.  Urology and nephrology team were consulted.   Assessment & Plan:  Acute kidney injury on CKD stage IIIb Hyperkalemia Anion gap metabolic acidosis - Baseline creatinine 2.0, admission creatinine 13.7 likely from acute urinary retention causing severe AKI.  Creatinine is improving (pending am labs), cont IVF today  Acute urinary retention - Likely secondary to BPH.  Urology consulted, Foley catheter placed.  Will maintain catheter for at least 1-2 weeks and outpatient follow-up.  Flomax and finasteride to be continued.  Acute cystitis/urinary tract infection with hematuria - In the setting of urinary retention.  Currently on IV Rocephin  Severe aortic stenosis status post TAVR 05/30/24 History of CAD -Routine postop management.  Daily aspirin -Follow-up patient cardiology.  Intolerant to statins  Anemia of chronic disease - Baseline creatinine around 10   DVT prophylaxis: SCDs Start: 06/12/24 0042      Code Status: Full Code Family Communication:   Status is: Inpatient Remains inpatient appropriate because: Ongoing management for AKI   PT Follow up Recs:   Subjective: Feels ok no complaints.  Son at bedside. Hematuria is better.    Examination:  General exam: Appears calm and comfortable  Respiratory system: Clear to auscultation. Respiratory effort normal. Cardiovascular system: S1 & S2 heard, RRR. No JVD, murmurs, rubs, gallops or clicks. No pedal edema. Gastrointestinal system: Abdomen is nondistended, soft and nontender. No organomegaly or masses felt. Normal bowel sounds heard. Central nervous system: Alert and oriented. No focal neurological deficits. Extremities:  Symmetric 5 x 5 power. Skin: No rashes, lesions or ulcers Psychiatry: Judgement and insight appear normal. Mood & affect appropriate.

## 2024-06-13 NOTE — Progress Notes (Signed)
 PROGRESS NOTE    Drew Adams  FMW:[id number] DOB: 01-02-1941 DOA: 06/11/2024 PCP: Stacia Millman, PA    Brief Narrative:  83 year old with history of severe aortic stenosis status post TAVR 11//25, BPH, CKD 3B, anemia of chronic disease admitted to [facility] for acute renal failure in the setting of severe urinary retention requiring Foley catheter placement.  Patient was also found to have BPH.  Urology and nephrology team were consulted.   Assessment & Plan:  Acute kidney injury on CKD stage IIIb Hyperkalemia Anion gap metabolic acidosis - Baseline creatinine 2.0, admission creatinine 13.7 likely from acute urinary retention causing severe AKI.  Creatinine is improving (pending am labs), cont IVF today  Acute urinary retention - Likely secondary to BPH.  Urology consulted, Foley catheter placed.  Will maintain catheter for at least 1-2 weeks and outpatient follow-up.  Flomax and finasteride to be continued.  Acute cystitis/urinary tract infection with hematuria - In the setting of urinary retention.  Currently on IV Rocephin  Severe aortic stenosis status post TAVR 05/30/24 History of CAD -Routine postop management.  Daily aspirin -Follow-up patient cardiology.  Intolerant to statins  Anemia of chronic disease - Baseline creatinine around 10   DVT prophylaxis: SCDs Start: 06/12/24 0042      Code Status: Full Code Family Communication:   Status is: Inpatient Remains inpatient appropriate because: Ongoing management for AKI   PT Follow up Recs:   Subjective: Feels ok no complaints.  Son at bedside. Hematuria is better.    Examination:  General exam: Appears calm and comfortable  Respiratory system: Clear to auscultation. Respiratory effort normal. Cardiovascular system: S1 & S2 heard, RRR. No JVD, murmurs, rubs, gallops or clicks. No pedal edema. Gastrointestinal system: Abdomen is nondistended, soft and nontender. No organomegaly or masses felt.  Normal bowel sounds heard. Central nervous system: Alert and oriented. No focal neurological deficits. Extremities: Symmetric 5 x 5 power. Skin: No rashes, lesions or ulcers Psychiatry: Judgement and insight appear normal. Mood & affect appropriate.                Diet Orders (From admission, onward)     Start     Ordered   06/12/24 0043  Diet regular Room service appropriate? Yes; Fluid consistency: Thin  Diet effective now       Question Answer Comment  Room service appropriate? Yes   Fluid consistency: Thin      06/12/24 0042            Objective: Vitals:   06/13/24 0700 06/13/24 0900 06/13/24 0928 06/13/24 0931  BP: (!) 126/55 (!) 129/55  (!) 133/55  Pulse: 80 85  89  Resp: 10 13  14   Temp:   97.9 F (36.6 C) 98.4 F (36.9 C)  TempSrc:   Oral Oral  SpO2: 98% 96%  99%  Weight:      Height:        Intake/Output Summary (Last 24 hours) at 06/13/2024 1215 Last data filed at 06/13/2024 1015 Gross per 24 hour  Intake --  Output 5325 ml  Net -5325 ml   Filed Weights   06/12/24 2009  Weight: 64.4 kg    Scheduled Meds:  vitamin C   1,000 mg Oral Daily   Chlorhexidine Gluconate Cloth  6 each Topical Daily   cyanocobalamin  1,000 mcg Oral Daily   finasteride  5 mg Oral Daily   tamsulosin  0.4 mg Oral Daily   Continuous Infusions:  sodium chloride  100 mL/hr at  06/13/24 1210   cefTRIAXone (ROCEPHIN)  IV Stopped (06/12/24 2207)    Nutritional status     Body mass index is 20.37 kg/m.  Data Reviewed:   CBC: Recent Labs  Lab 06/08/24 1122 06/11/24 1720 06/12/24 0636  WBC 13.0* 15.6* 10.2  NEUTROABS 10.1* 13.2* 8.4*  HGB 10.2* 9.7* 9.1*  HCT 31.4* 29.5* 28.1*  MCV 93 92.8 93.0  PLT 167 234 201   Basic Metabolic Panel: Recent Labs  Lab 06/11/24 1720 06/12/24 0636 06/13/24 1022  NA 137 144 141  K 5.5* 4.2 3.9  CL 98 112* 106  CO2 13* 14* 19*  GLUCOSE 98 87 97  BUN 196* 157* 89*  CREATININE 13.70* 8.87* 3.66*  CALCIUM  8.8* 8.4*  8.7*  MG  --  2.8*  --   PHOS  --   --  4.1   GFR: Estimated Creatinine Clearance: 14.4 mL/min (A) (by C-G formula based on SCr of 3.66 mg/dL (H)). Liver Function Tests: Recent Labs  Lab 06/11/24 1720 06/12/24 0636 06/13/24 1022  AST 17 15  --   ALT 5 <5  --   ALKPHOS 61 53  --   BILITOT 1.8* 1.5*  --   PROT 5.8* 4.8*  --   ALBUMIN 2.8* 2.4* 2.5*   No results for input(s): LIPASE, AMYLASE in the last 168 hours. No results for input(s): AMMONIA in the last 168 hours. Coagulation Profile: Recent Labs  Lab 06/11/24 1720  INR 1.2   Cardiac Enzymes: No results for input(s): CKTOTAL, CKMB, CKMBINDEX, TROPONINI in the last 168 hours. BNP (last 3 results) No results for input(s): PROBNP in the last 8760 hours. HbA1C: No results for input(s): HGBA1C in the last 72 hours. CBG: No results for input(s): GLUCAP in the last 168 hours. Lipid Profile: No results for input(s): CHOL, HDL, LDLCALC, TRIG, CHOLHDL, LDLDIRECT in the last 72 hours. Thyroid  Function Tests: No results for input(s): TSH, T4TOTAL, FREET4, T3FREE, THYROIDAB in the last 72 hours. Anemia Panel: Recent Labs    06/12/24 0636  FERRITIN 1,048*  TIBC 158*  IRON 55   Sepsis Labs: Recent Labs  Lab 06/11/24 2129  LATICACIDVEN 0.9    Recent Results (from the past 240 hours)  Urine Culture     Status: None   Collection Time: 06/11/24  5:23 PM   Specimen: Urine, Clean Catch  Result Value Ref Range Status   Specimen Description URINE, CLEAN CATCH  Final   Special Requests NONE  Final   Culture   Final    NO GROWTH Performed at [facility], [address], [place], [place] [address]    Report Status 06/12/2024 FINAL  Final  Blood Culture (routine x 2)     Status: None (Preliminary result)   Collection Time: 06/11/24  8:22 PM   Specimen: BLOOD  Result Value Ref Range Status   Specimen Description BLOOD BLOOD RIGHT HAND  Final   Special Requests   Final     BOTTLES DRAWN AEROBIC AND ANAEROBIC Blood Culture adequate volume   Culture   Final    NO GROWTH 2 DAYS Performed at [facility], [address] [place], [place] [address]    Report Status PENDING  Incomplete  Urine Culture     Status: None   Collection Time: 06/11/24  8:23 PM   Specimen: Urine, Clean Catch  Result Value Ref Range Status   Specimen Description URINE, CLEAN CATCH  Final   Special Requests NONE  Final  Culture   Final    NO GROWTH Performed at [facility], [address], [place], [place] [address]    Report Status 06/13/2024 FINAL  Final  Blood Culture (routine x 2)     Status: None (Preliminary result)   Collection Time: 06/11/24  8:27 PM   Specimen: BLOOD  Result Value Ref Range Status   Specimen Description BLOOD BLOOD RIGHT HAND  Final   Special Requests   Final    BOTTLES DRAWN AEROBIC AND ANAEROBIC Blood Culture results may not be optimal due to an inadequate volume of blood received in culture bottles   Culture   Final    NO GROWTH 2 DAYS Performed at [facility], [address] [place], [place] [address]    Report Status PENDING  Incomplete  Resp panel by RT-PCR (RSV, Flu A&B, Covid) Anterior Nasal Swab     Status: None   Collection Time: 06/12/24  8:38 AM   Specimen: Anterior Nasal Swab  Result Value Ref Range Status   SARS Coronavirus 2 by RT PCR NEGATIVE NEGATIVE Final   Influenza A by PCR NEGATIVE NEGATIVE Final   Influenza B by PCR NEGATIVE NEGATIVE Final    Comment: (NOTE) The Xpert Xpress SARS-CoV-2/FLU/RSV plus assay is intended as an aid in the diagnosis of influenza from Nasopharyngeal swab specimens and should not be used as a sole basis for treatment. Nasal washings and aspirates are unacceptable for Xpert Xpress SARS-CoV-2/FLU/RSV testing.  Fact Sheet for Patients: bloggercourse.com  Fact Sheet for Healthcare Providers: seriousbroker.it  This test is not yet  approved or cleared by the [place]  FDA and has been authorized for detection and/or diagnosis of SARS-CoV-2 by FDA under an Emergency Use Authorization (EUA). This EUA will remain in effect (meaning this test can be used) for the duration of the COVID-19 declaration under Section 564(b)(1) of the Act, 21 U.S.C. section 360bbb-3(b)(1), unless the authorization is terminated or revoked.     Resp Syncytial Virus by PCR NEGATIVE NEGATIVE Final    Comment: (NOTE) Fact Sheet for Patients: bloggercourse.com  Fact Sheet for Healthcare Providers: seriousbroker.it  This test is not yet approved or cleared by the [place]  FDA and has been authorized for detection and/or diagnosis of SARS-CoV-2 by FDA under an Emergency Use Authorization (EUA). This EUA will remain in effect (meaning this test can be used) for the duration of the COVID-19 declaration under Section 564(b)(1) of the Act, 21 U.S.C. section 360bbb-3(b)(1), unless the authorization is terminated or revoked.  Performed at [facility], [address], [place], [place] [address]          Radiology Studies: CT Renal Stone Study Result Date: 06/11/2024 EXAM: CT ABDOMEN AND PELVIS WITHOUT CONTRAST 06/11/2024 10:39:57 PM TECHNIQUE: CT of the abdomen and pelvis was performed without the administration of intravenous contrast. Multiplanar reformatted images are provided for review. Automated exposure control, iterative reconstruction, and/or weight-based adjustment of the mA/kV was utilized to reduce the radiation dose to as low as reasonably achievable. COMPARISON: 04/11/2024 CLINICAL HISTORY: Sepsis, UTI, acute renal failure. FINDINGS: LOWER CHEST: Small right and trace left pleural effusions, new from prior examination. LIVER: The liver is unremarkable. GALLBLADDER AND BILE DUCTS: Gallbladder is unremarkable. No biliary ductal dilatation. SPLEEN: No acute abnormality.  PANCREAS: No acute abnormality. ADRENAL GLANDS: No acute abnormality. KIDNEYS, URETERS AND BLADDER: The kidneys are normal in size and position. Punctate 1-2 mm nonobstructing calculi are seen within the kidneys bilaterally. Mild left hydronephrosis  to the level of the left ureterovesicular junction, new from prior examination. This may be impinged by the enlarged central lobe of the prostate gland. Moderate left perinephric stranding. Changes within the left kidney may relate to acute pyelonephritis or represent residual obstructive changes secondary to a recently passed ureteral calculus. No hydronephrosis on the right. No ureteral calculi on the right. No perinephric or periureteral stranding on the right. Numerous 2-3 mm calculi are seen dependently within the bladder lumen, similar to prior examination. Foley catheter balloon is seen within an unfortunately decompressed bladder lumen. The bladder wall is circumferentially thickened. GI AND BOWEL: Appendix normal. The stomach, small bowel, and large bowel are otherwise unremarkable. There is no bowel obstruction. PERITONEUM AND RETROPERITONEUM: No ascites. No free air. VASCULATURE: Mild aortoiliac atherosclerotic calcification. No aortic aneurysm. Aorta is normal in caliber. LYMPH NODES: No lymphadenopathy. REPRODUCTIVE ORGANS: Enlarged central lobe of the prostate gland, which may be impinging the left ureterovesicular junction. BONES AND SOFT TISSUES: Osseous structures are age appropriate. No acute bone abnormality. No lytic or blastic bone lesion. No focal soft tissue abnormality. IMPRESSION: 1. Small right and trace left pleural effusions, new from prior examination 2. Mild left hydronephrosis to the level of the left ureterovesicular junction, possibly impinged by enlarged central prostatic lobe, new from prior examination; moderate left perinephric stranding, correlate for urinary tract infection versus residual changes from a recently passed ureteral  calculus 3. Numerous 23 mm dependent bladder calculi, similar to prior; punctate 12 mm nonobstructing bilateral renal calculi; no right hydronephrosis and no right ureteral calculus 4. raf score: Aortic atherosclerosis (icd10-i70.0) Electronically signed by: Dorethia Molt MD 06/11/2024 10:52 PM EST RP Workstation: [id number]           LOS: 1 day   Time spent= 35 mins    Burgess JAYSON Dare, MD [facility]  If 7PM-7AM, please contact night-coverage  06/13/2024, 12:15 PM

## 2024-06-14 DIAGNOSIS — N179 Acute kidney failure, unspecified: Secondary | ICD-10-CM | POA: Diagnosis not present

## 2024-06-14 DIAGNOSIS — N1831 Chronic kidney disease, stage 3a: Secondary | ICD-10-CM | POA: Diagnosis not present

## 2024-06-14 LAB — BASIC METABOLIC PANEL WITH GFR
Anion gap: 15 (ref 5–15)
BUN: 65 mg/dL — ABNORMAL HIGH (ref 8–23)
CO2: 17 mmol/L — ABNORMAL LOW (ref 22–32)
Calcium: 8.4 mg/dL — ABNORMAL LOW (ref 8.9–10.3)
Chloride: 112 mmol/L — ABNORMAL HIGH (ref 98–111)
Creatinine, Ser: 2.72 mg/dL — ABNORMAL HIGH (ref 0.61–1.24)
GFR, Estimated: 23 mL/min — ABNORMAL LOW (ref >60)
Glucose, Bld: 91 mg/dL (ref 70–99)
Potassium: 3.8 mmol/L (ref 3.5–5.1)
Sodium: 144 mmol/L (ref 135–145)

## 2024-06-14 LAB — PHOSPHORUS: Phosphorus: 3.6 mg/dL (ref 2.5–4.6)

## 2024-06-14 LAB — CBC
HCT: 25.3 % — ABNORMAL LOW (ref 39.0–52.0)
Hemoglobin: 8.2 g/dL — ABNORMAL LOW (ref 13.0–17.0)
MCH: 29.7 pg (ref 26.0–34.0)
MCHC: 32.4 g/dL (ref 30.0–36.0)
MCV: 91.7 fL (ref 80.0–100.0)
Platelets: 187 K/uL (ref 150–400)
RBC: 2.76 MIL/uL — ABNORMAL LOW (ref 4.22–5.81)
RDW: 15.2 % (ref 11.5–15.5)
WBC: 9.9 K/uL (ref 4.0–10.5)
nRBC: 0 % (ref 0.0–0.2)

## 2024-06-14 LAB — MAGNESIUM: Magnesium: 1.9 mg/dL (ref 1.7–2.4)

## 2024-06-14 MED ORDER — HEPARIN SODIUM (PORCINE) 5000 UNIT/ML IJ SOLN
5000.0000 [IU] | Freq: Three times a day (TID) | INTRAMUSCULAR | Status: DC
  Administered 2024-06-14 – 2024-06-16 (×5): 5000 [IU] via SUBCUTANEOUS
  Filled 2024-06-14 (×5): qty 1

## 2024-06-14 MED ORDER — OXYMETAZOLINE HCL 0.05 % NA SOLN
1.0000 | Freq: Two times a day (BID) | NASAL | Status: DC
  Administered 2024-06-14 – 2024-06-16 (×5): 1 via NASAL
  Filled 2024-06-14: qty 30

## 2024-06-14 MED ORDER — SALINE SPRAY 0.65 % NA SOLN
1.0000 | Freq: Four times a day (QID) | NASAL | Status: DC
  Administered 2024-06-14 – 2024-06-16 (×8): 1 via NASAL
  Filled 2024-06-14: qty 44

## 2024-06-14 NOTE — Plan of Care (Signed)
  Problem: Clinical Measurements: Goal: Diagnostic test results will improve Outcome: Not Progressing   Problem: Clinical Measurements: Goal: Cardiovascular complication will be avoided Outcome: Not Progressing   Problem: Elimination: Goal: Will not experience complications related to urinary retention Outcome: Not Progressing   Problem: Elimination: Goal: Will not experience complications related to bowel motility Outcome: Not Progressing   Problem: Safety: Goal: Ability to remain free from injury will improve Outcome: Not Progressing   Problem: Skin Integrity: Goal: Risk for impaired skin integrity will decrease Outcome: Not Progressing

## 2024-06-14 NOTE — Plan of Care (Signed)

## 2024-06-14 NOTE — Evaluation (Addendum)
 Physical Therapy Evaluation Patient Details Name: Drew Adams MRN: [id number] DOB: [birth year] Today's Date: 06/14/2024  History of Present Illness  83 y.o. male presents to [facility]/11/24 with AKI on CKD. Pt also with acute cystitis with hematuria. PMHx: aortic stenosis status post TAVR on 05/30/2024, BPH, CKD 3B, dementia.  Clinical Impression  PTA pt was independent for mobility with no AD. Pt presents close to mobility baseline requiring supervision to stand with no AD and CGA to ambulate 156ft. Pt was slightly unsteady with no AD and would reach for UE support if available. Per pt, he is slightly unsteady at baseline. Discussed using a RW for UE support and to improve stability with pt verbalizing agreement. Pt has will have 24/7 assist available upon d/c home. Recommending HHPT to work on balance and on improving activity tolerance. Acute PT to follow to address functional limitations.      If plan is discharge home, recommend the following: A little help with walking and/or transfers;A little help with bathing/dressing/bathroom;Assist for transportation;Help with stairs or ramp for entrance   Can travel by private vehicle    Yes    Equipment Recommendations Rolling walker (2 wheels)     Functional Status Assessment Patient has had a recent decline in their functional status and demonstrates the ability to make significant improvements in function in a reasonable and predictable amount of time.     Precautions / Restrictions Precautions Precautions: Fall Recall of Precautions/Restrictions: Intact Restrictions Weight Bearing Restrictions Per Provider Order: No      Mobility  Bed Mobility    General bed mobility comments: nt, received in recliner    Transfers Overall transfer level: Needs assistance Equipment used: None Transfers: Sit to/from Stand Sit to Stand: Supervision    General transfer comment: for safety, no physical assist needed     Ambulation/Gait Ambulation/Gait assistance: Contact guard assist Gait Distance (Feet): 160 Feet Assistive device: None Gait Pattern/deviations: Step-through pattern, Decreased stride length Gait velocity: decr     General Gait Details: slightly unsteady with no AD with pt reaching for UE support if available    Balance Overall balance assessment: Mild deficits observed, not formally tested          Pertinent Vitals/Pain Pain Assessment Pain Assessment: No/denies pain    Home Living Family/patient expects to be discharged to:: Private residence Living Arrangements: Alone Available Help at Discharge: Family;Available 24 hours/day (for short term) Type of Home: House Home Access: Stairs to enter Entrance Stairs-Rails: None Entrance Stairs-Number of Steps: 3 Alternate Level Stairs-Number of Steps: 6 (up/down; split level) Home Layout: Multi-level Home Equipment: None (may have a RW)      Prior Function Prior Level of Function : Driving;Independent/Modified Independent    ADLs Comments: since TAVR, family reports more lethargic and more difficulty with ADL/IADL tasks     Extremity/Trunk Assessment   Upper Extremity Assessment Upper Extremity Assessment: Defer to OT evaluation    Lower Extremity Assessment Lower Extremity Assessment: Generalized weakness    Cervical / Trunk Assessment Cervical / Trunk Assessment: Kyphotic  Communication   Communication Communication: No apparent difficulties    Cognition Arousal: Alert Behavior During Therapy: WFL for tasks assessed/performed   PT - Cognitive impairments: History of cognitive impairments    Following commands: Intact       Cueing Cueing Techniques: Verbal cues      PT Assessment Patient needs continued PT services  PT Problem List Decreased strength;Decreased activity tolerance;Decreased balance;Decreased mobility       PT  Treatment Interventions DME instruction;Gait training;Stair  training;Functional mobility training;Therapeutic activities;Therapeutic exercise;Balance training;Neuromuscular re-education;Patient/family education    PT Goals (Current goals can be found in the Care Plan section)  Acute Rehab PT Goals Patient Stated Goal: to get stronger PT Goal Formulation: With patient Time For Goal Achievement: 06/28/24 Potential to Achieve Goals: Good    Frequency Min 2X/week        AM-PAC PT 6 Clicks Mobility  Outcome Measure Help needed turning from your back to your side while in a flat bed without using bedrails?: A Little Help needed moving from lying on your back to sitting on the side of a flat bed without using bedrails?: A Little Help needed moving to and from a bed to a chair (including a wheelchair)?: A Little Help needed standing up from a chair using your arms (e.g., wheelchair or bedside chair)?: A Little Help needed to walk in hospital room?: A Little Help needed climbing 3-5 steps with a railing? : A Little 6 Click Score: 18    End of Session   Activity Tolerance: Patient tolerated treatment well Patient left: in chair;with call bell/phone within reach;with family/visitor present Nurse Communication: Mobility status PT Visit Diagnosis: Unsteadiness on feet (R26.81);Other abnormalities of gait and mobility (R26.89);Muscle weakness (generalized) (M62.81)    Time: 8944-8885 PT Time Calculation (min) (ACUTE ONLY): 19 min   Charges:   PT Evaluation $PT Eval Low Complexity: 1 Low   PT General Charges $$ ACUTE PT VISIT: 1 Visit       Drew Adams, PT, DPT Secure Chat Preferred  Rehab Office [phone]   Drew Adams Wendolyn 06/14/2024, 3:14 PM

## 2024-06-14 NOTE — TOC Progression Note (Signed)
 Transition of Care [facility]) - Progression Note    Patient Details  Name: Drew Adams MRN: [id number] Date of Birth: 1940-10-26  [facility]) CM/SW Contact  Marval Gell, RN Phone Number: 06/14/2024, 11:52 AM  Clinical Narrative:     RONNALD and BSC will be delivered to the room today through [facility]   Expected Discharge Plan: Home w Home Health Services Barriers to Discharge: Continued Medical Work up               Expected Discharge Plan and Services   Discharge Planning Services: CM Consult Post Acute Care Choice: Durable Medical Equipment, Home Health Living arrangements for the past 2 months: Single Family Home                 DME Arranged: Bedside commode, Walker rolling DME Agency: [employer] Date DME Agency Contacted: 06/14/24 Time DME Agency Contacted: 1151 Representative spoke with at DME Agency: [place] CHEADLE Agency: [facility] Date [facility] Agency Contacted: 06/13/24 Time HH Agency Contacted: 1533 Representative spoke with at [facility] Agency: Arna   Social Drivers of Health (SDOH) Interventions SDOH Screenings   Food Insecurity: No Food Insecurity (06/12/2024)  Housing: Low Risk  (06/12/2024)  Transportation Needs: No Transportation Needs (06/12/2024)  Utilities: Not At Risk (06/12/2024)  Social Connections: Socially Isolated (06/12/2024)  Tobacco Use: Low Risk  (06/12/2024)    Readmission Risk Interventions     No data to display

## 2024-06-14 NOTE — Care Management Important Message (Signed)
 Important Message  Patient Details  Name: Drew Adams MRN: [id number] Date of Birth: August 26, 1940   Important Message Given:  Yes - Medicare IM     Claretta Deed 06/14/2024, 4:25 PM

## 2024-06-14 NOTE — Progress Notes (Signed)
   06/14/24 0944  Mobility  Activity Pivoted/transferred from bed to chair  Level of Assistance Minimal assist, patient does 75% or more  Assistive Device Other (Comment) (HHA)  Activity Response Tolerated fair  Mobility Referral Yes  Mobility visit 1 Mobility  Mobility Specialist Start Time (ACUTE ONLY) 0944  Mobility Specialist Stop Time (ACUTE ONLY) 0953  Mobility Specialist Time Calculation (min) (ACUTE ONLY) 9 min   Mobility Specialist: Progress Note   Pt agreeable to mobility session - received in bed. Pt was asymptomatic throughout session with no complaints. Returned to chair with all needs met - call bell within reach.    Additional comments: Student RN and instructor present.    Virgle Boards, BS Mobility Specialist Please contact via SecureChat or  Rehab office at [phone].

## 2024-06-14 NOTE — Progress Notes (Signed)
 PROGRESS NOTE     Patient Demographics:    Drew Adams, is a 83 y.o. male, DOB - 1940-10-01, FMW:[id number]  Outpatient Primary MD for the patient is Stacia Millman, PA    LOS - 2  Admit date - 06/11/2024    Chief Complaint  Patient presents with   Post-op Problem       Brief Narrative (HPI from H&P)   83 year old with history of severe aortic stenosis status post TAVR 11//25, BPH, CKD 3B, anemia of chronic disease admitted to [facility] for acute renal failure in the setting of severe urinary retention requiring Foley catheter placement. Patient was also found to have BPH. Urology and nephrology team were consulted.    Subjective:    Drew Adams today has, No headache, No chest pain, No abdominal pain - No Nausea, No new weakness tingling or numbness, no SOB   Assessment  & Plan :   Acute urinary retention likely due to BPH causing AKI on CKD stage IIIb with hyperkalemia and anion gap metabolic acidosis.   Patient has known history of BPH, follows with [facility], Flomax continued, currently has Foley catheter, renal function is improving, will likely will be discharged with indwelling Foley catheter and outpatient urology follow-up with [facility] in 1 to 2 weeks.  Symptom-free this morning, advance activity PT OT if stable likely discharge in 1 to 2 days.  Monitor cultures thus far negative.   Acute cystitis/urinary tract infection with hematuria - In the setting of urinary retention.  Currently on IV Rocephin   Severe aortic stenosis status post TAVR 05/30/24, History of CAD  -no acute issues, continue daily aspirin, Follow-up patient cardiology.  Intolerant to statins   Anemia of chronic  disease - Baseline creatinine around 10      Condition - Fair  Family Communication  :  None  Code Status :  Full  Consults  :  None  PUD Prophylaxis :    Procedures  :      CT - IMPRESSION: 1. Small right and trace left pleural effusions, new from prior examination  2. Mild left hydronephrosis to the level of the left ureterovesicular junction, possibly impinged by enlarged central prostatic lobe, new from prior examination; moderate left perinephric stranding, correlate for urinary tract infection versus residual changes from a recently passed ureteral calculus 3. Numerous 23 mm dependent bladder calculi, similar to prior; punctate 12 mm nonobstructing bilateral renal calculi; no right hydronephrosis and no right ureteral calculus 4. raf score: Aortic atherosclerosis (icd10-i70.0)      Disposition Plan  :    Status is: Inpatient  DVT Prophylaxis  :    heparin  injection 5,000 Units Start: 06/14/24 0845 SCDs Start: 06/12/24 0042    Lab Results  Component Value Date   PLT 187 06/14/2024    Diet :  Diet Order             Diet regular Room service appropriate? Yes; Fluid consistency: Thin  Diet effective now                    Inpatient Medications  Scheduled Meds:  vitamin C   1,000 mg Oral Daily   Chlorhexidine Gluconate Cloth  6 each Topical Daily   cyanocobalamin  1,000 mcg Oral Daily   finasteride  5 mg Oral Daily   heparin  injection (subcutaneous)  5,000 Units Subcutaneous Q8H   tamsulosin  0.4 mg Oral Daily   Continuous Infusions:  sodium chloride  100 mL/hr at 06/13/24 2359   cefTRIAXone (ROCEPHIN)  IV Stopped (06/13/24 2125)   PRN Meds:.acetaminophen  **OR** acetaminophen , glucagon (human recombinant), hydrALAZINE, ipratropium-albuterol, melatonin, metoprolol  tartrate, ondansetron  (ZOFRAN ) IV, senna-docusate  Antibiotics  :    Anti-infectives (From admission, onward)    Start     Dose/Rate Route Frequency Ordered Stop   06/12/24 2200   cefTRIAXone (ROCEPHIN) 1 g in sodium chloride  0.9 % 100 mL IVPB        1 g 200 mL/hr over 30 Minutes Intravenous Every 24 hours 06/12/24 0045     06/11/24 2030  cefTRIAXone (ROCEPHIN) 1 g in sodium chloride  0.9 % 100 mL IVPB        1 g 200 mL/hr over 30 Minutes Intravenous  Once 06/11/24 2021 06/11/24 2315         Objective:   Vitals:   06/13/24 2347 06/14/24 0356 06/14/24 0400 06/14/24 0414  BP: (!) 133/52  117/74   Pulse: 82  82   Resp: 15  19   Temp: 98.8 F (37.1 C) 98.6 F (37 C)    TempSrc: Oral Oral    SpO2: 97%  98%   Weight:    66.7 kg  Height:        Wt Readings from Last 3 Encounters:  06/14/24 66.7 kg  06/08/24 64.4 kg  05/31/24 67.1 kg     Intake/Output Summary (Last 24 hours) at 06/14/2024 0756 Last data filed at 06/14/2024 0400 Gross per 24 hour  Intake 1878.88 ml  Output 3900 ml  Net -2021.12 ml     Physical Exam  Awake Alert, No new F.N deficits, Normal affect [place].AT,PERRAL Supple Neck, No JVD,   Symmetrical Chest wall movement, Good air movement bilaterally, CTAB RRR,No Gallops,Rubs or new Murmurs,  +ve B.Sounds, Abd Soft, No tenderness,   No Cyanosis, Clubbing or edema      Data Review:    Recent Labs  Lab 06/08/24 1122 06/11/24 1720 06/12/24 0636 06/14/24 0250  WBC 13.0* 15.6* 10.2 9.9  HGB 10.2* 9.7* 9.1* 8.2*  HCT 31.4* 29.5* 28.1* 25.3*  PLT 167 234 201 187  MCV 93 92.8 93.0 91.7  MCH 30.1 30.5 30.1 29.7  MCHC 32.5 32.9 32.4 32.4  RDW 14.3 15.2 15.3 15.2  LYMPHSABS 0.8 0.7 0.5*  --   MONOABS  --  1.5* 1.0  --   EOSABS 0.1 0.1 0.1  --   BASOSABS 0.0 0.1 0.0  --     Recent Labs  Lab 06/11/24 1720 06/11/24 2129 06/12/24 0636 06/13/24 1022 06/14/24 0250  NA 137  --  144 141 144  K 5.5*  --  4.2 3.9 3.8  CL 98  --  112* 106 112*  CO2 13*  --  14* 19* 17*  ANIONGAP 26*  --  18* 16* 15  GLUCOSE 98  --  87 97 91  BUN 196*  --  157* 89* 65*  CREATININE 13.70*  --  8.87* 3.66* 2.72*  AST 17  --  15  --   --   ALT  5  --  <5  --   --   ALKPHOS 61  --  53  --   --   BILITOT 1.8*  --  1.5*  --   --   ALBUMIN  2.8*  --  2.4* 2.5*  --   LATICACIDVEN  --  0.9  --   --   --   INR 1.2  --   --   --   --   MG  --   --  2.8*  --  1.9  PHOS  --   --   --  4.1 3.6  CALCIUM  8.8*  --  8.4* 8.7* 8.4*      Recent Labs  Lab 06/11/24 1720 06/11/24 2129 06/12/24 0636 06/13/24 1022 06/14/24 0250  LATICACIDVEN  --  0.9  --   --   --   INR 1.2  --   --   --   --   MG  --   --  2.8*  --  1.9  CALCIUM  8.8*  --  8.4* 8.7* 8.4*    --------------------------------------------------------------------------------------------------------------- No results found for: CHOL, HDL, LDLCALC, LDLDIRECT, TRIG, CHOLHDL  No results found for: HGBA1C No results for input(s): TSH, T4TOTAL, FREET4, T3FREE, THYROIDAB in the last 72 hours. Recent Labs    06/12/24 0636  FERRITIN 1,048*  TIBC 158*  IRON 55   ------------------------------------------------------------------------------------------------------------------ Cardiac Enzymes No results for input(s): CKMB, TROPONINI, MYOGLOBIN in the last 168 hours.  Invalid input(s): CK  Micro Results Recent Results (from the past 240 hours)  Urine Culture     Status: None   Collection Time: 06/11/24  5:23 PM   Specimen: Urine, Clean Catch  Result Value Ref Range Status   Specimen Description URINE, CLEAN CATCH  Final   Special Requests NONE  Final   Culture   Final    NO GROWTH Performed at [facility], [address] [place], [place] [address]    Report Status 06/12/2024 FINAL  Final  Blood Culture (routine x 2)     Status: None (Preliminary result)   Collection Time: 06/11/24  8:22 PM   Specimen: BLOOD  Result Value Ref Range Status   Specimen Description BLOOD BLOOD RIGHT HAND  Final   Special Requests   Final    BOTTLES DRAWN AEROBIC AND ANAEROBIC Blood Culture adequate volume   Culture   Final    NO GROWTH 2  DAYS Performed at [facility], [address], [place], [place] [address]    Report Status PENDING  Incomplete  Urine Culture     Status: None   Collection Time: 06/11/24  8:23 PM   Specimen: Urine, Clean Catch  Result Value Ref Range Status   Specimen Description URINE, CLEAN CATCH  Final   Special Requests NONE  Final   Culture   Final    NO GROWTH Performed at [facility], [address], [place], [place] [address]    Report Status 06/13/2024 FINAL  Final  Blood Culture (routine x 2)     Status: None (Preliminary result)   Collection Time: 06/11/24  8:27 PM   Specimen: BLOOD  Result Value Ref Range Status   Specimen Description BLOOD BLOOD RIGHT HAND  Final   Special Requests   Final    BOTTLES DRAWN AEROBIC AND ANAEROBIC Blood Culture results may not be optimal due to an inadequate volume of blood received in culture bottles   Culture   Final    NO GROWTH 2 DAYS Performed at [facility], [address], [place], [place] [address]    Report Status PENDING  Incomplete  Resp panel by RT-PCR (RSV, Flu A&B, Covid) Anterior Nasal Swab     Status: None   Collection Time: 06/12/24  8:38 AM   Specimen: Anterior Nasal Swab  Result Value Ref Range Status   SARS Coronavirus 2 by RT PCR NEGATIVE NEGATIVE Final   Influenza A by PCR NEGATIVE NEGATIVE Final   Influenza B by PCR NEGATIVE NEGATIVE Final    Comment: (NOTE) The Xpert Xpress SARS-CoV-2/FLU/RSV plus assay is intended as an aid in the diagnosis of influenza from Nasopharyngeal swab specimens and should not be used as a sole basis for treatment. Nasal washings and aspirates are unacceptable for Xpert Xpress SARS-CoV-2/FLU/RSV testing.  Fact Sheet for Patients: bloggercourse.com  Fact Sheet for Healthcare Providers: seriousbroker.it  This test is not yet approved or cleared by the [place]  FDA and has been authorized for detection and/or diagnosis  of SARS-CoV-2 by FDA under an Emergency Use Authorization (EUA). This EUA will remain in effect (meaning this test can be used) for the duration of the COVID-19 declaration under Section 564(b)(1) of the Act, 21 U.S.C. section 360bbb-3(b)(1), unless the authorization is terminated or revoked.     Resp Syncytial Virus by PCR NEGATIVE NEGATIVE Final    Comment: (NOTE) Fact Sheet for Patients: bloggercourse.com  Fact Sheet for Healthcare Providers: seriousbroker.it  This test is not yet approved or cleared by the [place]  FDA and has been authorized for detection and/or diagnosis of SARS-CoV-2 by FDA under an Emergency Use Authorization (EUA). This EUA will remain in effect (meaning this test can be used) for the duration of the COVID-19 declaration under Section 564(b)(1) of the Act, 21 U.S.C. section 360bbb-3(b)(1), unless the authorization is terminated or revoked.  Performed at [facility], [address] [place], [place] [address]     Radiology Report No results found.   Signature  -   Lavada Stank M.D on 06/14/2024 at 7:56 AM   -  To page go to [email or web address]

## 2024-06-15 ENCOUNTER — Other Ambulatory Visit (HOSPITAL_COMMUNITY): Payer: Self-pay

## 2024-06-15 DIAGNOSIS — N1831 Chronic kidney disease, stage 3a: Secondary | ICD-10-CM | POA: Diagnosis not present

## 2024-06-15 DIAGNOSIS — N179 Acute kidney failure, unspecified: Secondary | ICD-10-CM | POA: Diagnosis not present

## 2024-06-15 LAB — BASIC METABOLIC PANEL WITH GFR
Anion gap: 10 (ref 5–15)
BUN: 45 mg/dL — ABNORMAL HIGH (ref 8–23)
CO2: 22 mmol/L (ref 22–32)
Calcium: 8.2 mg/dL — ABNORMAL LOW (ref 8.9–10.3)
Chloride: 111 mmol/L (ref 98–111)
Creatinine, Ser: 1.99 mg/dL — ABNORMAL HIGH (ref 0.61–1.24)
GFR, Estimated: 33 mL/min — ABNORMAL LOW (ref >60)
Glucose, Bld: 99 mg/dL (ref 70–99)
Potassium: 3.5 mmol/L (ref 3.5–5.1)
Sodium: 143 mmol/L (ref 135–145)

## 2024-06-15 LAB — CBC
HCT: 22.8 % — ABNORMAL LOW (ref 39.0–52.0)
Hemoglobin: 7.4 g/dL — ABNORMAL LOW (ref 13.0–17.0)
MCH: 30 pg (ref 26.0–34.0)
MCHC: 32.5 g/dL (ref 30.0–36.0)
MCV: 92.3 fL (ref 80.0–100.0)
Platelets: 183 K/uL (ref 150–400)
RBC: 2.47 MIL/uL — ABNORMAL LOW (ref 4.22–5.81)
RDW: 15.3 % (ref 11.5–15.5)
WBC: 10 K/uL (ref 4.0–10.5)
nRBC: 0 % (ref 0.0–0.2)

## 2024-06-15 LAB — PREPARE RBC (CROSSMATCH)

## 2024-06-15 LAB — MAGNESIUM: Magnesium: 1.6 mg/dL — ABNORMAL LOW (ref 1.7–2.4)

## 2024-06-15 MED ORDER — CEPHALEXIN 500 MG PO CAPS
500.0000 mg | ORAL_CAPSULE | Freq: Two times a day (BID) | ORAL | 0 refills | Status: DC
  Filled 2024-06-15: qty 6, 3d supply, fill #0

## 2024-06-15 MED ORDER — OXYMETAZOLINE HCL 0.05 % NA SOLN
1.0000 | Freq: Two times a day (BID) | NASAL | 0 refills | Status: AC
  Filled 2024-06-15: qty 30, 150d supply, fill #0

## 2024-06-15 MED ORDER — PANTOPRAZOLE SODIUM 40 MG PO TBEC
40.0000 mg | DELAYED_RELEASE_TABLET | Freq: Every day | ORAL | Status: DC
  Administered 2024-06-15 – 2024-06-16 (×2): 40 mg via ORAL
  Filled 2024-06-15 (×2): qty 1

## 2024-06-15 MED ORDER — PANTOPRAZOLE SODIUM 40 MG PO TBEC
40.0000 mg | DELAYED_RELEASE_TABLET | Freq: Every day | ORAL | 0 refills | Status: AC
  Filled 2024-06-15: qty 30, 30d supply, fill #0

## 2024-06-15 MED ORDER — FUROSEMIDE 10 MG/ML IJ SOLN
20.0000 mg | Freq: Once | INTRAMUSCULAR | Status: AC
  Administered 2024-06-15: 20 mg via INTRAVENOUS
  Filled 2024-06-15: qty 2

## 2024-06-15 MED ORDER — SODIUM CHLORIDE 0.9% IV SOLUTION: Freq: Once | INTRAVENOUS | Status: AC

## 2024-06-15 MED ORDER — MAGNESIUM SULFATE 4 GM/100ML IV SOLN
4.0000 g | Freq: Once | INTRAVENOUS | Status: AC
  Administered 2024-06-15: 4 g via INTRAVENOUS
  Filled 2024-06-15: qty 100

## 2024-06-15 MED ORDER — POTASSIUM CHLORIDE CRYS ER 20 MEQ PO TBCR
40.0000 meq | EXTENDED_RELEASE_TABLET | Freq: Two times a day (BID) | ORAL | Status: AC
  Administered 2024-06-15 (×2): 40 meq via ORAL
  Filled 2024-06-15 (×2): qty 2

## 2024-06-15 MED ORDER — TAMSULOSIN HCL 0.4 MG PO CAPS
0.4000 mg | ORAL_CAPSULE | Freq: Every day | ORAL | 0 refills | Status: DC
  Filled 2024-06-15: qty 30, 30d supply, fill #0

## 2024-06-15 MED ORDER — SALINE SPRAY 0.65 % NA SOLN
1.0000 | Freq: Four times a day (QID) | NASAL | 0 refills | Status: AC
  Filled 2024-06-15: qty 44, 30d supply, fill #0

## 2024-06-15 NOTE — Discharge Summary (Signed)
 Discharge summary note.  Drew Adams FMW:[id number] DOB: 1940/12/29 DOA: 06/11/2024  PCP: Stacia Millman, PA  Admit date: 06/11/2024  Discharge date: 06/16/2024  Admitted From: Home   Disposition:  Home   Recommendations for Outpatient Follow-up:   Follow up with PCP in 1-2 weeks  PCP Please obtain BMP/CBC, 2 view CXR in 1week,  (see Discharge instructions)   PCP Please follow up on the following pending results:     Home Health: Pt, RN   Equipment/Devices: as below  Discharge Condition: Stable     CODE STATUS: Full     Diet Recommendation: Heart Healthy      Chief Complaint  Patient presents with   Post-op Problem     Brief history of present illness from the day of admission and additional interim summary    83 year old with history of severe aortic stenosis status post TAVR 11//25, BPH, CKD 3B, anemia of chronic disease admitted to [facility] for acute renal failure in the setting of severe urinary retention requiring Foley catheter placement. Patient was also found to have BPH. Urology and nephrology team were consulted.                                                                  Hospital Course    Acute urinary retention likely due to BPH causing AKI on CKD stage IIIb with hyperkalemia and anion gap metabolic acidosis.   Patient has known history of BPH, follows with [facility], Flomax  continued, currently has Foley catheter, renal function is improving, will likely will be discharged with indwelling Foley catheter and outpatient urology follow-up with [facility] in 1 to 2 weeks.  Symptom-free this morning, will be discharged with Foley catheter to home with home PT and RN.  Will require outpatient urology follow-up PCP to kindly help arrange.   Acute  cystitis/urinary tract infection with hematuria - In the setting of urinary retention, treated empirically with Rocephin  responded well to 3 more days of oral Keflex  upon discharge with outpatient follow-up with urology and PCP in 7 to 10 days   Severe aortic stenosis status post TAVR 05/30/24, History of CAD  -no acute issues, continue daily aspirin , Follow-up patient cardiology.  Intolerant to statins   Anemia of chronic disease - Baseline Hb around 10, received 1 unit of packed RBC, had a very mild nosebleed plus received several liters of fluid causing him dilution.  Posttransfusion HD stable.  Will add PPI as well to his regimen, PCP to monitor CBC in 7 to 10 days.  He is completely symptom-free.  Mild epistaxis on 06/14/2024.  Due to dry nose.  Resolved after supportive care.  Nasal sprays provided.   Discharge diagnosis     Principal Problem:   Acute kidney injury superimposed on stage  3a chronic kidney disease (HCC) Active Problems:   History of transcatheter aortic valve replacement (TAVR)   Acute urinary retention   BPH (benign prostatic hyperplasia)   Acute cystitis   Leukocytosis   Hyperkalemia   High anion gap metabolic acidosis   Anemia of chronic disease    Discharge instructions    Discharge Instructions     Diet - low sodium heart healthy   Complete by: As directed    Discharge instructions   Complete by: As directed    Follow with Primary MD Stacia, Alexus, PA in 7 days, also follow-up with the recommended urologist in 7 to 10 days and your cardiologist within a week.  Get CBC, CMP, Magnesium , 2 view Chest X ray -  checked next visit with your primary MD    Activity: As tolerated with Full fall precautions use walker/cane & assistance as needed  Disposition Home    Diet: Heart Healthy    Special Instructions: If you have smoked or chewed Tobacco  in the last 2 yrs please stop smoking, stop any regular Alcohol  and or any Recreational drug use.  On  your next visit with your primary care physician please Get Medicines reviewed and adjusted.  Please request your Prim.MD to go over all Hospital Tests and Procedure/Radiological results at the follow up, please get all Hospital records sent to your Prim MD by signing hospital release before you go home.  If you experience worsening of your admission symptoms, develop shortness of breath, life threatening emergency, suicidal or homicidal thoughts you must seek medical attention immediately by calling 911 or calling your MD immediately  if symptoms less severe.  You Must read complete instructions/literature along with all the possible adverse reactions/side effects for all the Medicines you take and that have been prescribed to you. Take any new Medicines after you have completely understood and accpet all the possible adverse reactions/side effects.   Do not drive when taking Pain medications.  Do not take more than prescribed Pain, Sleep and Anxiety Medications  Wear Seat belts while driving.   Increase activity slowly   Complete by: As directed        Discharge Medications   Allergies as of 06/16/2024       Reactions   Crestor  [rosuvastatin ] Other (See Comments)   Leg pain and cramping   Exforge [amlodipine Besylate-valsartan] Other (See Comments)   Myalgia    Simvastatin Other (See Comments)        Medication List     STOP taking these medications    amoxicillin  500 MG capsule Commonly known as: AMOXIL        TAKE these medications    allopurinol  300 MG tablet Commonly known as: ZYLOPRIM  Take 300 mg by mouth daily.   aspirin  81 MG chewable tablet Chew 1 tablet (81 mg total) by mouth daily.   calcium  citrate 950 (200 Ca) MG tablet Commonly known as: CALCITRATE - dosed in mg elemental calcium  Take 600 mg by mouth daily.   cephALEXin  500 MG capsule Commonly known as: KEFLEX  Take 1 capsule (500 mg total) by mouth 2 (two) times daily for 3 days.    cyanocobalamin  1000 MCG tablet Commonly known as: VITAMIN B12 Take 1,000 mcg by mouth daily.   finasteride  5 MG tablet Commonly known as: PROSCAR  Take 5 mg by mouth daily.   hydrocortisone cream 1 % Apply 1 Application topically daily.   MAGNESIUM  GLYCINATE PO Take 400 mg by mouth daily.   meclizine  25  MG tablet Commonly known as: ANTIVERT  Take 1 tablet (25 mg total) by mouth 3 (three) times daily as needed for dizziness.   multivitamin with minerals Tabs tablet Take 1 tablet by mouth daily. Mature   OVER THE COUNTER MEDICATION Take 1 tablet by mouth daily. prostacor   oxymetazoline  0.05 % nasal spray Commonly known as: AFRIN Place 1 spray into both nostrils 2 (two) times daily.   pantoprazole  40 MG tablet Commonly known as: PROTONIX  Take 1 tablet (40 mg total) by mouth daily.   predniSONE  5 MG tablet Commonly known as: DELTASONE  Take 5 mg by mouth daily as needed (Gout).   psyllium 58.6 % packet Commonly known as: METAMUCIL Take 1 packet by mouth daily. 10 ml   PURE L-ARGININE HCL PO Take 1,800 mg by mouth daily.   sodium chloride  0.65 % Soln nasal spray Commonly known as: OCEAN Place 1 spray into both nostrils 4 (four) times daily.   tamsulosin  0.4 MG Caps capsule Commonly known as: FLOMAX  Take 1 capsule (0.4 mg total) by mouth daily.   vitamin C  1000 MG tablet Take 1,000 mg by mouth daily.   Zinc  Gluconate 15 MG Tabs Take 50 mg by mouth daily. Plus copper 2 mg               Durable Medical Equipment  (From admission, onward)           Start     Ordered   06/14/24 1150  For home use only DME Walker rolling  Once       Question Answer Comment  Walker: With 5 Inch Wheels   Patient needs a walker to treat with the following condition Weakness      06/14/24 1150   06/14/24 1150  For home use only DME Bedside commode  Once       Question:  Patient needs a bedside commode to treat with the following condition  Answer:  Weakness   06/14/24  1150             Contact information for follow-up providers     Manny, Ricardo KATHEE Raddle., MD. Schedule an appointment as soon as possible for a visit in 1 week(s).   Specialty: Urology Contact information: [address] [place] [address] [phone]         Stacia Millman, PA. Schedule an appointment as soon as possible for a visit in 1 week(s).   Specialty: Physician Assistant Contact information: [address] [place] [address] [phone]         Tobie Gordy POUR, MD. Schedule an appointment as soon as possible for a visit in 1 week(s).   Specialties: Nephrology, Vascular Surgery Why: [email or web address] Contact information: [address] [place] [address] [phone]              Contact information for after-discharge care     Home Medical Care     [facility]) .   Service: Home Health Services Contact information: [address] [place]  [address] [phone]                     Major procedures and Radiology Reports - PLEASE review detailed and final reports thoroughly  -       CT Renal Stone Study Result Date: 06/11/2024 EXAM: CT ABDOMEN AND PELVIS WITHOUT CONTRAST 06/11/2024 10:39:57 PM TECHNIQUE: CT of the abdomen and pelvis was performed without the  administration of intravenous contrast. Multiplanar reformatted images are provided for review. Automated exposure control, iterative reconstruction, and/or weight-based adjustment of the mA/kV was utilized to reduce the radiation dose to as low as reasonably achievable. COMPARISON: 04/11/2024 CLINICAL HISTORY: Sepsis, UTI, acute renal failure. FINDINGS: LOWER CHEST: Small right and trace left pleural effusions, new from prior examination. LIVER: The liver is unremarkable. GALLBLADDER AND BILE DUCTS: Gallbladder is unremarkable. No biliary ductal dilatation. SPLEEN: No acute abnormality. PANCREAS: No acute abnormality.  ADRENAL GLANDS: No acute abnormality. KIDNEYS, URETERS AND BLADDER: The kidneys are normal in size and position. Punctate 1-2 mm nonobstructing calculi are seen within the kidneys bilaterally. Mild left hydronephrosis to the level of the left ureterovesicular junction, new from prior examination. This may be impinged by the enlarged central lobe of the prostate gland. Moderate left perinephric stranding. Changes within the left kidney may relate to acute pyelonephritis or represent residual obstructive changes secondary to a recently passed ureteral calculus. No hydronephrosis on the right. No ureteral calculi on the right. No perinephric or periureteral stranding on the right. Numerous 2-3 mm calculi are seen dependently within the bladder lumen, similar to prior examination. Foley catheter balloon is seen within an unfortunately decompressed bladder lumen. The bladder wall is circumferentially thickened. GI AND BOWEL: Appendix normal. The stomach, small bowel, and large bowel are otherwise unremarkable. There is no bowel obstruction. PERITONEUM AND RETROPERITONEUM: No ascites. No free air. VASCULATURE: Mild aortoiliac atherosclerotic calcification. No aortic aneurysm. Aorta is normal in caliber. LYMPH NODES: No lymphadenopathy. REPRODUCTIVE ORGANS: Enlarged central lobe of the prostate gland, which may be impinging the left ureterovesicular junction. BONES AND SOFT TISSUES: Osseous structures are age appropriate. No acute bone abnormality. No lytic or blastic bone lesion. No focal soft tissue abnormality. IMPRESSION: 1. Small right and trace left pleural effusions, new from prior examination 2. Mild left hydronephrosis to the level of the left ureterovesicular junction, possibly impinged by enlarged central prostatic lobe, new from prior examination; moderate left perinephric stranding, correlate for urinary tract infection versus residual changes from a recently passed ureteral calculus 3. Numerous 23 mm  dependent bladder calculi, similar to prior; punctate 12 mm nonobstructing bilateral renal calculi; no right hydronephrosis and no right ureteral calculus 4. raf score: Aortic atherosclerosis (icd10-i70.0) Electronically signed by: Dorethia Molt MD 06/11/2024 10:52 PM EST RP Workstation: [id number]   ECHOCARDIOGRAM COMPLETE Result Date: 05/31/2024    ECHOCARDIOGRAM REPORT   Patient Name:   Drew Adams Date of Exam: 05/31/2024 Medical Rec #:  [id number]    Height:       70.0 in Accession #:    [id number]   Weight:       147.9 lb Date of Birth:  1941/03/08    BSA:          1.836 m Patient Age:    83 years     BP:           146/124 mmHg Patient Gender: M            HR:           85 bpm. Exam Location:  Inpatient Procedure: 2D Echo and Strain Analysis (Both Spectral and Color Flow Doppler            were utilized during procedure). Indications:    Post TAVR evaluation  History:        Patient has prior history of Echocardiogram examinations. Risk  Factors:Hypertension.                 Aortic Valve: 29 mm Sapien prosthetic, stented (TAVR) valve is                 present in the aortic position.  Sonographer:    Charmaine Gaskins Referring Phys: KATHRYN R THOMPSON IMPRESSIONS  1. Left ventricular ejection fraction, by estimation, is 55 to 60%. The left ventricle has normal function. The left ventricle has no regional wall motion abnormalities. There is moderate concentric left ventricular hypertrophy. Left ventricular diastolic parameters are consistent with Grade I diastolic dysfunction (impaired relaxation). The average left ventricular global longitudinal strain is -15.1 %. The global longitudinal strain is abnormal.  2. Right ventricular systolic function is normal. The right ventricular size is normal.  3. Left atrial size was severely dilated.  4. Posterior leaflet severely restricted. The mitral valve is degenerative. Mild mitral valve regurgitation. No evidence of mitral stenosis. The mean mitral  valve gradient is 4.0 mmHg with average heart rate of 81 bpm. Severe mitral annular calcification.  5. Mild perivalvular regurgitation. The aortic valve has been repaired/replaced. Aortic valve regurgitation is mild. No aortic stenosis is present. There is a 29 mm Sapien prosthetic (TAVR) valve present in the aortic position. Aortic valve area, by VTI  measures 2.12 cm. Aortic valve mean gradient measures 10.0 mmHg. Aortic valve Vmax measures 2.44 m/s.  6. Aortic dilatation noted. There is mild dilatation of the ascending aorta, measuring 43 mm.  7. The inferior vena cava is normal in size with greater than 50% respiratory variability, suggesting right atrial pressure of 3 mmHg. FINDINGS  Left Ventricle: Left ventricular ejection fraction, by estimation, is 55 to 60%. The left ventricle has normal function. The left ventricle has no regional wall motion abnormalities. The average left ventricular global longitudinal strain is -15.1 %. Strain was performed and the global longitudinal strain is abnormal. The left ventricular internal cavity size was normal in size. There is moderate concentric left ventricular hypertrophy. Left ventricular diastolic parameters are consistent with Grade I diastolic dysfunction (impaired relaxation). Indeterminate filling pressures. Right Ventricle: The right ventricular size is normal. No increase in right ventricular wall thickness. Right ventricular systolic function is normal. Left Atrium: Left atrial size was severely dilated. Right Atrium: Right atrial size was normal in size. Pericardium: There is no evidence of pericardial effusion. Mitral Valve: Posterior leaflet severely restricted. The mitral valve is degenerative in appearance. There is moderate thickening of the mitral valve leaflet(s). There is moderate calcification of the mitral valve leaflet(s). Severe mitral annular calcification. Mild mitral valve regurgitation. No evidence of mitral valve stenosis. MV peak gradient,  12.0 mmHg. The mean mitral valve gradient is 4.0 mmHg with average heart rate of 81 bpm. Tricuspid Valve: The tricuspid valve is normal in structure. Tricuspid valve regurgitation is not demonstrated. No evidence of tricuspid stenosis. Aortic Valve: Mild perivalvular regurgitation. The aortic valve has been repaired/replaced. Aortic valve regurgitation is mild. No aortic stenosis is present. Aortic valve mean gradient measures 10.0 mmHg. Aortic valve peak gradient measures 23.8 mmHg. Aortic valve area, by VTI measures 2.12 cm. There is a 29 mm Sapien prosthetic, stented (TAVR) valve present in the aortic position. Pulmonic Valve: The pulmonic valve was normal in structure. Pulmonic valve regurgitation is trivial. No evidence of pulmonic stenosis. Aorta: Aortic dilatation noted. There is mild dilatation of the ascending aorta, measuring 43 mm. Venous: The inferior vena cava is normal in size with greater than  50% respiratory variability, suggesting right atrial pressure of 3 mmHg. IAS/Shunts: No atrial level shunt detected by color flow Doppler.  LEFT VENTRICLE PLAX 2D LVIDd:         4.30 cm   Diastology LVIDs:         2.50 cm   LV e' medial:    5.87 cm/s LV PW:         1.30 cm   LV E/e' medial:  13.1 LV IVS:        1.40 cm   LV e' lateral:   9.90 cm/s LVOT diam:     2.20 cm   LV E/e' lateral: 7.8 LV SV:         81 LV SV Index:   44        2D Longitudinal Strain LVOT Area:     3.80 cm  2D Strain GLS (A4C):   -16.2 %                          2D Strain GLS (A3C):   -15.2 %                          2D Strain GLS (A2C):   -13.9 %                          2D Strain GLS Avg:     -15.1 % RIGHT VENTRICLE RV Basal diam:  2.10 cm RV Mid diam:    2.20 cm RV S prime:     12.60 cm/s LEFT ATRIUM             Index        RIGHT ATRIUM           Index LA diam:        4.10 cm 2.23 cm/m   RA Area:     14.30 cm LA Vol (A2C):   83.6 ml 45.53 ml/m  RA Volume:   32.30 ml  17.59 ml/m LA Vol (A4C):   92.2 ml 50.21 ml/m LA Biplane Vol:  91.4 ml 49.78 ml/m  AORTIC VALVE AV Area (Vmax):    1.95 cm AV Area (Vmean):   2.14 cm AV Area (VTI):     2.12 cm AV Vmax:           244.00 cm/s AV Vmean:          141.000 cm/s AV VTI:            0.380 m AV Peak Grad:      23.8 mmHg AV Mean Grad:      10.0 mmHg LVOT Vmax:         125.00 cm/s LVOT Vmean:        79.500 cm/s LVOT VTI:          0.212 m LVOT/AV VTI ratio: 0.56  AORTA Ao Root diam: 3.30 cm Ao Asc diam:  4.30 cm MITRAL VALVE MV Area (PHT): 5.70 cm     SHUNTS MV Area VTI:   2.66 cm     Systemic VTI:  0.21 m MV Peak grad:  12.0 mmHg    Systemic Diam: 2.20 cm MV Mean grad:  4.0 mmHg MV Vmax:       1.73 m/s MV Vmean:      96.0 cm/s MV Decel Time: 133 msec MV E velocity: 76.80 cm/s MV A velocity: 158.00 cm/s  MV E/A ratio:  0.49 Annabella Scarce MD Electronically signed by Annabella Scarce MD Signature Date/Time: 05/31/2024/11:38:51 AM    Final    ECHOCARDIOGRAM LIMITED Result Date: 05/30/2024    ECHOCARDIOGRAM LIMITED REPORT   Patient Name:   Drew Adams Date of Exam: 05/30/2024 Medical Rec #:  [id number]    Height:       70.0 in Accession #:    [id number]   Weight:       141.0 lb Date of Birth:  14-Feb-1941    BSA:          1.799 m Patient Age:    83 years     BP:           166/60 mmHg Patient Gender: M            HR:           84 bpm. Exam Location:  Inpatient Procedure: Limited Echo, Cardiac Doppler and Color Doppler (Both Spectral and            Color Flow Doppler were utilized during procedure). Indications:     Aortic Stenosis i35.0  History:         Patient has prior history of Echocardiogram examinations, most                  recent 04/11/2024. Risk Factors:Hypertension and Dyslipidemia.  Sonographer:     Damien Senior RDCS Referring Phys:  [id number] LURENA MARLA RED Diagnosing Phys: Stanly Leavens MD  Sonographer Comments: 29mm Edwards S3U TAVR implanted IMPRESSIONS  1. Left ventricular ejection fraction, by estimation, is 60 to 65%. The left ventricle has normal function.  2. Right ventricular  systolic function is normal. The right ventricular size is normal.  3. Mild to moderate mitral valve regurgitation. Severe mitral annular calcification.  4. Prior to procedure, severe aortic stenosis. Mean gradient 34 mm Hg Peak gradient 57 mm Hg. Moderate central AI. DVI 0.26, AVA 0.98 cm2.     After procedure, a 29 mm Sapien Valve. Mean gradient 4 mm Hg, Peak gradient 7 mm Hg, DVI 0.62. EOA 2.38 cm2. Trivial PVL against the intervalvular fibrosa. FINDINGS  Left Ventricle: Left ventricular ejection fraction, by estimation, is 60 to 65%. The left ventricle has normal function. The left ventricular internal cavity size was normal in size. Right Ventricle: The right ventricular size is normal. Right ventricular systolic function is normal. Mitral Valve: Severe mitral annular calcification. Mild to moderate mitral valve regurgitation. MV peak gradient, 8.0 mmHg. The mean mitral valve gradient is 3.0 mmHg. Aortic Valve: Prior to procedure, severe aortic stenosis. Mean gradient 34 mm Hg Peak gradient 57 mm Hg. Moderate central AI. DVI 0.26, AVA 0.98 cm2. After procedure, a 29 mm Sapien Valve. Mean gradient 4 mm Hg, Peak gradient 7 mm Hg, DVI 0.62. EOA 2.38 cm2. Trivial PVL against the intervalvular fibrosa. Aortic regurgitation PHT measures 251 msec. Aortic valve mean gradient measures 4.0 mmHg. Aortic valve peak gradient measures 6.7 mmHg. Aortic valve area, by VTI measures 2.38 cm. Additional Comments: Spectral Doppler performed. Color Doppler performed.  LEFT VENTRICLE PLAX 2D LVOT diam:     2.20 cm LV SV:         63 LV SV Index:   35 LVOT Area:     3.80 cm  AORTIC VALVE AV Area (Vmax):    2.37 cm AV Area (Vmean):   2.62 cm AV Area (VTI):     2.38 cm AV Vmax:  129.00 cm/s AV Vmean:          89.900 cm/s AV VTI:            0.265 m AV Peak Grad:      6.7 mmHg AV Mean Grad:      4.0 mmHg LVOT Vmax:         80.30 cm/s LVOT Vmean:        62.000 cm/s LVOT VTI:          0.166 m LVOT/AV VTI ratio: 0.63 AI PHT:             251 msec MITRAL VALVE MV Area VTI:  2.58 cm   SHUNTS MV Peak grad: 8.0 mmHg   Systemic VTI:  0.17 m MV Mean grad: 3.0 mmHg   Systemic Diam: 2.20 cm MV Vmax:      1.41 m/s MV Vmean:     84.3 cm/s Stanly Leavens MD Electronically signed by Stanly Leavens MD Signature Date/Time: 05/30/2024/8:48:47 PM    Final    Structural Heart Procedure Result Date: 05/30/2024 See surgical note for result.  DG Chest 2 View Result Date: 05/27/2024 CLINICAL DATA:  Aortic stenosis.  Pre-op clearance exam EXAM: DG CHEST 2V COMPARISON:  None Available. FINDINGS: The heart size and mediastinal contours are within normal limits. Pulmonary hyperinflation is seen, consistent with COPD. Both lungs are clear. Mild thoracic dextroscoliosis noted. IMPRESSION: No active cardiopulmonary disease. Electronically Signed   By: Norleen DELENA Kil M.D.   On: 05/27/2024 07:36    Micro Results    Recent Results (from the past 240 hours)  Urine Culture     Status: None   Collection Time: 06/11/24  5:23 PM   Specimen: Urine, Clean Catch  Result Value Ref Range Status   Specimen Description URINE, CLEAN CATCH  Final   Special Requests NONE  Final   Culture   Final    NO GROWTH Performed at [facility], [address], [place], [place] [address]    Report Status 06/12/2024 FINAL  Final  Blood Culture (routine x 2)     Status: None   Collection Time: 06/11/24  8:22 PM   Specimen: BLOOD  Result Value Ref Range Status   Specimen Description BLOOD BLOOD RIGHT HAND  Final   Special Requests   Final    BOTTLES DRAWN AEROBIC AND ANAEROBIC Blood Culture adequate volume   Culture   Final    NO GROWTH 5 DAYS Performed at [facility], [address] [place], [place] [address]    Report Status 06/16/2024 FINAL  Final  Urine Culture     Status: None   Collection Time: 06/11/24  8:23 PM   Specimen: Urine, Clean Catch  Result Value Ref Range Status   Specimen Description URINE, CLEAN CATCH  Final   Special  Requests NONE  Final   Culture   Final    NO GROWTH Performed at [facility], [address] [place], [place] [address]    Report Status 06/13/2024 FINAL  Final  Blood Culture (routine x 2)     Status: None   Collection Time: 06/11/24  8:27 PM   Specimen: BLOOD  Result Value Ref Range Status   Specimen Description BLOOD BLOOD RIGHT HAND  Final   Special Requests   Final    BOTTLES DRAWN AEROBIC AND ANAEROBIC Blood Culture results may not be optimal due to an inadequate volume of blood received in culture bottles   Culture  Final    NO GROWTH 5 DAYS Performed at [facility], [address], [place], [place] [address]    Report Status 06/16/2024 FINAL  Final  Resp panel by RT-PCR (RSV, Flu A&B, Covid) Anterior Nasal Swab     Status: None   Collection Time: 06/12/24  8:38 AM   Specimen: Anterior Nasal Swab  Result Value Ref Range Status   SARS Coronavirus 2 by RT PCR NEGATIVE NEGATIVE Final   Influenza A by PCR NEGATIVE NEGATIVE Final   Influenza B by PCR NEGATIVE NEGATIVE Final    Comment: (NOTE) The Xpert Xpress SARS-CoV-2/FLU/RSV plus assay is intended as an aid in the diagnosis of influenza from Nasopharyngeal swab specimens and should not be used as a sole basis for treatment. Nasal washings and aspirates are unacceptable for Xpert Xpress SARS-CoV-2/FLU/RSV testing.  Fact Sheet for Patients: bloggercourse.com  Fact Sheet for Healthcare Providers: seriousbroker.it  This test is not yet approved or cleared by the [place]  FDA and has been authorized for detection and/or diagnosis of SARS-CoV-2 by FDA under an Emergency Use Authorization (EUA). This EUA will remain in effect (meaning this test can be used) for the duration of the COVID-19 declaration under Section 564(b)(1) of the Act, 21 U.S.C. section 360bbb-3(b)(1), unless the authorization is terminated or revoked.     Resp Syncytial Virus by PCR  NEGATIVE NEGATIVE Final    Comment: (NOTE) Fact Sheet for Patients: bloggercourse.com  Fact Sheet for Healthcare Providers: seriousbroker.it  This test is not yet approved or cleared by the [place]  FDA and has been authorized for detection and/or diagnosis of SARS-CoV-2 by FDA under an Emergency Use Authorization (EUA). This EUA will remain in effect (meaning this test can be used) for the duration of the COVID-19 declaration under Section 564(b)(1) of the Act, 21 U.S.C. section 360bbb-3(b)(1), unless the authorization is terminated or revoked.  Performed at [facility], [address], [place], [place] [address]     Today   Subjective    Drew Adams today has no headache,no chest abdominal pain,no new weakness tingling or numbness, feels much better wants to go home today.    Objective   Blood pressure (!) 114/49, pulse 71, temperature 98.5 F (36.9 C), temperature source Oral, resp. rate 15, height 5' 10 (1.778 m), weight 66.7 kg, SpO2 95%.   Intake/Output Summary (Last 24 hours) at 06/16/2024 0821 Last data filed at 06/16/2024 0417 Gross per 24 hour  Intake 445.83 ml  Output 4025 ml  Net -3579.17 ml    Exam  Awake Alert, No new F.N deficits,    [place].AT,PERRAL Supple Neck,   Symmetrical Chest wall movement, Good air movement bilaterally, CTAB RRR,No Gallops,   +ve B.Sounds, Abd Soft, Non tender,  No Cyanosis, Clubbing or edema    Data Review   Recent Labs  Lab 06/11/24 1720 06/12/24 0636 06/14/24 0250 06/15/24 0236 06/16/24 0229  WBC 15.6* 10.2 9.9 10.0 10.7*  HGB 9.7* 9.1* 8.2* 7.4* 8.6*  HCT 29.5* 28.1* 25.3* 22.8* 26.4*  PLT 234 201 187 183 165  MCV 92.8 93.0 91.7 92.3 91.0  MCH 30.5 30.1 29.7 30.0 29.7  MCHC 32.9 32.4 32.4 32.5 32.6  RDW 15.2 15.3 15.2 15.3 15.5  LYMPHSABS 0.7 0.5*  --   --   --   MONOABS 1.5* 1.0  --   --   --   EOSABS 0.1 0.1  --   --   --   BASOSABS 0.1 0.0   --   --   --  Recent Labs  Lab 06/11/24 1720 06/11/24 2129 06/12/24 0636 06/13/24 1022 06/14/24 0250 06/15/24 0236 06/16/24 0229  NA 137  --  144 141 144 143 139  K 5.5*  --  4.2 3.9 3.8 3.5 4.0  CL 98  --  112* 106 112* 111 107  CO2 13*  --  14* 19* 17* 22 21*  ANIONGAP 26*  --  18* 16* 15 10 11   GLUCOSE 98  --  87 97 91 99 98  BUN 196*  --  157* 89* 65* 45* 37*  CREATININE 13.70*  --  8.87* 3.66* 2.72* 1.99* 2.09*  AST 17  --  15  --   --   --   --   ALT 5  --  <5  --   --   --   --   ALKPHOS 61  --  53  --   --   --   --   BILITOT 1.8*  --  1.5*  --   --   --   --   ALBUMIN  2.8*  --  2.4* 2.5*  --   --   --   LATICACIDVEN  --  0.9  --   --   --   --   --   INR 1.2  --   --   --   --   --   --   MG  --   --  2.8*  --  1.9 1.6* 2.0  PHOS  --   --   --  4.1 3.6  --   --   CALCIUM  8.8*  --  8.4* 8.7* 8.4* 8.2* 8.1*    Total Time in preparing paper work, data evaluation and todays exam - 35 minutes  Signature  -    Lavada Stank M.D on 06/16/2024 at 8:21 AM   -  To page go to [email or web address]

## 2024-06-15 NOTE — Plan of Care (Signed)

## 2024-06-15 NOTE — Discharge Instructions (Addendum)
 Follow with Primary MD Stacia, Alexus, PA in 7 days, also follow-up with the recommended urologist in 7 to 10 days and your cardiologist within a week.  Get CBC, CMP, Magnesium, 2 view Chest X ray -  checked next visit with your primary MD    Activity: As tolerated with Full fall precautions use walker/cane & assistance as needed  Disposition Home    Diet: Heart Healthy    Special Instructions: If you have smoked or chewed Tobacco  in the last 2 yrs please stop smoking, stop any regular Alcohol  and or any Recreational drug use.  On your next visit with your primary care physician please Get Medicines reviewed and adjusted.  Please request your Prim.MD to go over all Hospital Tests and Procedure/Radiological results at the follow up, please get all Hospital records sent to your Prim MD by signing hospital release before you go home.  If you experience worsening of your admission symptoms, develop shortness of breath, life threatening emergency, suicidal or homicidal thoughts you must seek medical attention immediately by calling 911 or calling your MD immediately  if symptoms less severe.  You Must read complete instructions/literature along with all the possible adverse reactions/side effects for all the Medicines you take and that have been prescribed to you. Take any new Medicines after you have completely understood and accpet all the possible adverse reactions/side effects.   Do not drive when taking Pain medications.  Do not take more than prescribed Pain, Sleep and Anxiety Medications  Wear Seat belts while driving.

## 2024-06-15 NOTE — Progress Notes (Signed)
 PROGRESS NOTE     Patient Demographics:    Drew Adams, is a 83 y.o. male, DOB - 01-05-1941, FMW:[id number]  Outpatient Primary MD for the patient is Stacia Millman, PA    LOS - 3  Admit date - 06/11/2024    Chief Complaint  Patient presents with   Post-op Problem       Brief Narrative (HPI from H&P)   83 year old with history of severe aortic stenosis status post TAVR 11//25, BPH, CKD 3B, anemia of chronic disease admitted to [facility] for acute renal failure in the setting of severe urinary retention requiring Foley catheter placement. Patient was also found to have BPH. Urology and nephrology team were consulted.    Subjective:    Drew Adams today has, No headache, No chest pain, No abdominal pain - No Nausea, No new weakness tingling or numbness, no SOB   Assessment  & Plan :   Acute urinary retention likely due to BPH causing AKI on CKD stage IIIb with hyperkalemia and anion gap metabolic acidosis.   Patient has known history of BPH, follows with [facility], Flomax continued, currently has Foley catheter, renal function is improving, will likely will be discharged with indwelling Foley catheter and outpatient urology follow-up with [facility] in 1 to 2 weeks.  Symptom-free this morning, advance activity PT OT if stable likely discharge in 1 to 2 days.  Monitor cultures thus far negative.   Acute cystitis/urinary tract infection with hematuria - In the setting of urinary retention.  Currently on IV Rocephin   Severe aortic stenosis status post TAVR 05/30/24, History of CAD  -no acute issues, continue daily aspirin, Follow-up patient cardiology.  Intolerant to statins   Anemia of chronic  disease - Baseline hemoglobin around 10, had some nosebleed but also large element of fume dilution, transfuse 1 unit of packed RBC on 06/15/2024 and monitor.  Mild epistaxis.  Resolved due to dry air, nasal saline spray and Afrin spray.  Requested to keep his room temperature low he likes it above 80 degrees, room is extremely hot and dry.  Condition - Fair  Family Communication  :  None  Code Status :  Full  Consults  :  None  PUD Prophylaxis :    Procedures  :      CT - IMPRESSION: 1. Small right and trace left pleural effusions, new from prior examination 2. Mild left hydronephrosis to the level of the left ureterovesicular junction, possibly impinged by enlarged central prostatic lobe, new from prior examination; moderate left perinephric stranding, correlate for urinary tract infection versus residual changes from a recently passed ureteral calculus 3. Numerous 23 mm dependent bladder calculi, similar to prior; punctate 12 mm nonobstructing bilateral renal calculi; no right hydronephrosis and no right ureteral calculus 4. raf score: Aortic atherosclerosis (icd10-i70.0)      Disposition Plan  :    Status is: Inpatient  DVT Prophylaxis  :    heparin  injection 5,000 Units Start: 06/14/24 1400 SCDs Start: 06/12/24 0042    Lab Results  Component Value Date   PLT 183 06/15/2024    Diet :  Diet Order             Diet - low sodium heart healthy           Diet regular Room service appropriate? Yes; Fluid consistency: Thin  Diet effective now                    Inpatient Medications  Scheduled Meds:  sodium chloride    Intravenous Once   vitamin C   1,000 mg Oral Daily   Chlorhexidine Gluconate Cloth  6 each Topical Daily   cyanocobalamin  1,000 mcg Oral Daily   finasteride  5 mg Oral Daily   furosemide  20 mg Intravenous Once   heparin  injection (subcutaneous)  5,000 Units Subcutaneous Q8H   oxymetazoline  1 spray Each Nare BID   potassium  chloride  40 mEq Oral BID   sodium chloride   1 spray Each Nare QID   tamsulosin  0.4 mg Oral Daily   Continuous Infusions:  cefTRIAXone (ROCEPHIN)  IV 1 g (06/14/24 2108)   PRN Meds:.acetaminophen  **OR** acetaminophen , glucagon (human recombinant), hydrALAZINE, ipratropium-albuterol, melatonin, metoprolol  tartrate, ondansetron  (ZOFRAN ) IV, senna-docusate  Antibiotics  :    Anti-infectives (From admission, onward)    Start     Dose/Rate Route Frequency Ordered Stop   06/15/24 0000  cephALEXin (KEFLEX) 500 MG capsule        500 mg Oral 2 times daily 06/15/24 0859 06/18/24 2359   06/12/24 2200  cefTRIAXone (ROCEPHIN) 1 g in sodium chloride  0.9 % 100 mL IVPB        1 g 200 mL/hr over 30 Minutes Intravenous Every 24 hours 06/12/24 0045     06/11/24 2030  cefTRIAXone (ROCEPHIN) 1 g in sodium chloride  0.9 % 100 mL IVPB        1 g 200 mL/hr over 30 Minutes Intravenous  Once 06/11/24 2021 06/11/24 2315         Objective:   Vitals:   06/14/24 2023 06/14/24 2024 06/15/24 0400 06/15/24 0850  BP:    (!) 127/56  Pulse: 92   74  Resp: (!) 26 15  13   Temp:  98.6 F (37 C) 98.3 F (36.8 C) 98.2 F (36.8 C)  TempSrc:    Oral  SpO2: 95%   96%  Weight:      Height:        Wt Readings from Last 3 Encounters:  06/14/24 66.7 kg  06/08/24 64.4 kg  05/31/24 67.1 kg     Intake/Output Summary (Last 24 hours) at 06/15/2024 0907 Last data filed at 06/15/2024 0400 Gross per 24 hour  Intake 1206.18 ml  Output 2625 ml  Net -1418.82 ml     Physical Exam  Awake Alert, No new F.N deficits, Normal affect [place].AT,PERRAL Supple Neck, No JVD,   Symmetrical Chest wall movement, Good air movement bilaterally, CTAB RRR,No Gallops,Rubs or new Murmurs,  +ve B.Sounds, Abd Soft, No tenderness,   No Cyanosis, Clubbing or edema      Data Review:    Recent Labs  Lab 06/08/24 1122 06/11/24 1720 06/12/24 0636 06/14/24 0250 06/15/24 0236  WBC 13.0* 15.6* 10.2 9.9 10.0  HGB 10.2* 9.7* 9.1*  8.2* 7.4*  HCT 31.4* 29.5* 28.1* 25.3* 22.8*  PLT 167 234 201 187 183  MCV 93 92.8 93.0 91.7 92.3  MCH 30.1 30.5 30.1 29.7 30.0  MCHC 32.5 32.9 32.4 32.4 32.5  RDW 14.3 15.2 15.3 15.2 15.3  LYMPHSABS 0.8 0.7 0.5*  --   --   MONOABS  --  1.5* 1.0  --   --   EOSABS 0.1 0.1 0.1  --   --   BASOSABS 0.0 0.1 0.0  --   --     Recent Labs  Lab 06/11/24 1720 06/11/24 2129 06/12/24 0636 06/13/24 1022 06/14/24 0250 06/15/24 0236  NA 137  --  144 141 144 143  K 5.5*  --  4.2 3.9 3.8 3.5  CL 98  --  112* 106 112* 111  CO2 13*  --  14* 19* 17* 22  ANIONGAP 26*  --  18* 16* 15 10  GLUCOSE 98  --  87 97 91 99  BUN 196*  --  157* 89* 65* 45*  CREATININE 13.70*  --  8.87* 3.66* 2.72* 1.99*  AST 17  --  15  --   --   --   ALT 5  --  <5  --   --   --   ALKPHOS 61  --  53  --   --   --   BILITOT 1.8*  --  1.5*  --   --   --   ALBUMIN 2.8*  --  2.4* 2.5*  --   --   LATICACIDVEN  --  0.9  --   --   --   --   INR 1.2  --   --   --   --   --   MG  --   --  2.8*  --  1.9 1.6*  PHOS  --   --   --  4.1 3.6  --   CALCIUM  8.8*  --  8.4* 8.7* 8.4* 8.2*      Recent Labs  Lab 06/11/24 1720 06/11/24 2129 06/12/24 0636 06/13/24 1022 06/14/24 0250 06/15/24 0236  LATICACIDVEN  --  0.9  --   --   --   --   INR 1.2  --   --   --   --   --   MG  --   --  2.8*  --  1.9 1.6*  CALCIUM  8.8*  --  8.4* 8.7* 8.4* 8.2*    --------------------------------------------------------------------------------------------------------------- No results found for: CHOL, HDL, LDLCALC, LDLDIRECT, TRIG, CHOLHDL  No results found for: HGBA1C No results for input(s): TSH, T4TOTAL, FREET4, T3FREE, THYROIDAB in the last 72 hours. No results for input(s): VITAMINB12, FOLATE, FERRITIN, TIBC, IRON, RETICCTPCT in the last 72 hours.  ------------------------------------------------------------------------------------------------------------------ Cardiac  Enzymes No results for input(s):  CKMB, TROPONINI, MYOGLOBIN in the last 168 hours.  Invalid input(s): CK  Micro Results Recent Results (from the past 240 hours)  Urine Culture     Status: None   Collection Time: 06/11/24  5:23 PM   Specimen: Urine, Clean Catch  Result Value Ref Range Status   Specimen Description URINE, CLEAN CATCH  Final   Special Requests NONE  Final   Culture   Final    NO GROWTH Performed at [facility], [address], [place], [place] [address]    Report Status 06/12/2024 FINAL  Final  Blood Culture (routine x 2)     Status: None (Preliminary result)   Collection Time: 06/11/24  8:22 PM   Specimen: BLOOD  Result Value Ref Range Status   Specimen Description BLOOD BLOOD RIGHT HAND  Final   Special Requests   Final    BOTTLES DRAWN AEROBIC AND ANAEROBIC Blood Culture adequate volume   Culture   Final    NO GROWTH 4 DAYS Performed at [facility], [address], [place], [place] [address]    Report Status PENDING  Incomplete  Urine Culture     Status: None   Collection Time: 06/11/24  8:23 PM   Specimen: Urine, Clean Catch  Result Value Ref Range Status   Specimen Description URINE, CLEAN CATCH  Final   Special Requests NONE  Final   Culture   Final    NO GROWTH Performed at [facility], [address] [place], [place] [address]    Report Status 06/13/2024 FINAL  Final  Blood Culture (routine x 2)     Status: None (Preliminary result)   Collection Time: 06/11/24  8:27 PM   Specimen: BLOOD  Result Value Ref Range Status   Specimen Description BLOOD BLOOD RIGHT HAND  Final   Special Requests   Final    BOTTLES DRAWN AEROBIC AND ANAEROBIC Blood Culture results may not be optimal due to an inadequate volume of blood received in culture bottles   Culture   Final    NO GROWTH 4 DAYS Performed at [facility], [address], [place], [place] [address]    Report Status PENDING  Incomplete  Resp panel by RT-PCR (RSV, Flu A&B, Covid) Anterior Nasal  Swab     Status: None   Collection Time: 06/12/24  8:38 AM   Specimen: Anterior Nasal Swab  Result Value Ref Range Status   SARS Coronavirus 2 by RT PCR NEGATIVE NEGATIVE Final   Influenza A by PCR NEGATIVE NEGATIVE Final   Influenza B by PCR NEGATIVE NEGATIVE Final    Comment: (NOTE) The Xpert Xpress SARS-CoV-2/FLU/RSV plus assay is intended as an aid in the diagnosis of influenza from Nasopharyngeal swab specimens and should not be used as a sole basis for treatment. Nasal washings and aspirates are unacceptable for Xpert Xpress SARS-CoV-2/FLU/RSV testing.  Fact Sheet for Patients: bloggercourse.com  Fact Sheet for Healthcare Providers: seriousbroker.it  This test is not yet approved or cleared by the [place]  FDA and has been authorized for detection and/or diagnosis of SARS-CoV-2 by FDA under an Emergency Use Authorization (EUA). This EUA will remain in effect (meaning this test can be used) for the duration of the COVID-19 declaration under Section 564(b)(1) of the Act, 21 U.S.C. section 360bbb-3(b)(1), unless the authorization is terminated or revoked.     Resp Syncytial Virus by PCR NEGATIVE NEGATIVE Final    Comment: (  NOTE) Fact Sheet for Patients: bloggercourse.com  Fact Sheet for Healthcare Providers: seriousbroker.it  This test is not yet approved or cleared by the [place]  FDA and has been authorized for detection and/or diagnosis of SARS-CoV-2 by FDA under an Emergency Use Authorization (EUA). This EUA will remain in effect (meaning this test can be used) for the duration of the COVID-19 declaration under Section 564(b)(1) of the Act, 21 U.S.C. section 360bbb-3(b)(1), unless the authorization is terminated or revoked.  Performed at [facility], [address], [place], [place] [address]     Radiology Report No results found.   Signature   -   Lavada Stank M.D on 06/15/2024 at 9:07 AM   -  To page go to [email or web address]

## 2024-06-16 ENCOUNTER — Other Ambulatory Visit (HOSPITAL_COMMUNITY): Payer: Self-pay

## 2024-06-16 DIAGNOSIS — N179 Acute kidney failure, unspecified: Secondary | ICD-10-CM | POA: Diagnosis not present

## 2024-06-16 DIAGNOSIS — N1831 Chronic kidney disease, stage 3a: Secondary | ICD-10-CM | POA: Diagnosis not present

## 2024-06-16 LAB — TYPE AND SCREEN
ABO/RH(D): A POS
Antibody Screen: NEGATIVE
Unit division: 0

## 2024-06-16 LAB — BASIC METABOLIC PANEL WITH GFR
Anion gap: 11 (ref 5–15)
BUN: 37 mg/dL — ABNORMAL HIGH (ref 8–23)
CO2: 21 mmol/L — ABNORMAL LOW (ref 22–32)
Calcium: 8.1 mg/dL — ABNORMAL LOW (ref 8.9–10.3)
Chloride: 107 mmol/L (ref 98–111)
Creatinine, Ser: 2.09 mg/dL — ABNORMAL HIGH (ref 0.61–1.24)
GFR, Estimated: 31 mL/min — ABNORMAL LOW (ref >60)
Glucose, Bld: 98 mg/dL (ref 70–99)
Potassium: 4 mmol/L (ref 3.5–5.1)
Sodium: 139 mmol/L (ref 135–145)

## 2024-06-16 LAB — MAGNESIUM: Magnesium: 2 mg/dL (ref 1.7–2.4)

## 2024-06-16 LAB — CULTURE, BLOOD (ROUTINE X 2)
Culture: NO GROWTH
Culture: NO GROWTH
Special Requests: ADEQUATE

## 2024-06-16 LAB — CBC
HCT: 26.4 % — ABNORMAL LOW (ref 39.0–52.0)
Hemoglobin: 8.6 g/dL — ABNORMAL LOW (ref 13.0–17.0)
MCH: 29.7 pg (ref 26.0–34.0)
MCHC: 32.6 g/dL (ref 30.0–36.0)
MCV: 91 fL (ref 80.0–100.0)
Platelets: 165 K/uL (ref 150–400)
RBC: 2.9 MIL/uL — ABNORMAL LOW (ref 4.22–5.81)
RDW: 15.5 % (ref 11.5–15.5)
WBC: 10.7 K/uL — ABNORMAL HIGH (ref 4.0–10.5)
nRBC: 0 % (ref 0.0–0.2)

## 2024-06-16 LAB — BPAM RBC
Blood Product Expiration Date: 202512132359
ISSUE DATE / TIME: 202511201046
Unit Type and Rh: 6200

## 2024-06-16 NOTE — TOC Transition Note (Signed)
 Transition of Care [facility]) - Discharge Note   Patient Details  Name: Drew Adams MRN: [id number] Date of Birth: 07-06-1941  Transition of Care [facility]) CM/SW Contact:  Corean JAYSON Canary, RN Phone Number: 06/16/2024, 8:45 AM   Clinical Narrative:     Patient is going home today DME ordered and sent to room, yesterday. HH with [facility] for DME  NO further needs identified  Final next level of care: Home w Home Health Services Barriers to Discharge: No Barriers Identified   Patient Goals and CMS Choice Patient states their goals for this hospitalization and ongoing recovery are:: to go home CMS Medicare.gov Compare Post Acute Care list provided to:: Other (Comment Required) Choice offered to / list presented to : Adult Children      Discharge Placement                       Discharge Plan and Services Additional resources added to the After Visit Summary for     Discharge Planning Services: CM Consult Post Acute Care Choice: Durable Medical Equipment, Home Health          DME Arranged: Bedside commode, Walker rolling DME Agency: [employer] Date DME Agency Contacted: 06/14/24 Time DME Agency Contacted: 1151 Representative spoke with at DME Agency: [place] CHEADLE Agency: [facility] Date [facility] Agency Contacted: 06/13/24 Time HH Agency Contacted: 1533 Representative spoke with at [facility] Agency: Arna  Social Drivers of Health (SDOH) Interventions SDOH Screenings   Food Insecurity: No Food Insecurity (06/12/2024)  Housing: Low Risk  (06/12/2024)  Transportation Needs: No Transportation Needs (06/12/2024)  Utilities: Not At Risk (06/12/2024)  Social Connections: Socially Isolated (06/12/2024)  Tobacco Use: Low Risk  (06/12/2024)     Readmission Risk Interventions     No data to display

## 2024-06-16 NOTE — Plan of Care (Signed)

## 2024-06-17 ENCOUNTER — Emergency Department (HOSPITAL_COMMUNITY)

## 2024-06-17 ENCOUNTER — Encounter (HOSPITAL_COMMUNITY): Payer: Self-pay

## 2024-06-17 ENCOUNTER — Inpatient Hospital Stay (HOSPITAL_COMMUNITY)
Admission: EM | Admit: 2024-06-17 | Discharge: 2024-06-26 | DRG: 663 | Disposition: A | Discharge status: Skilled Nursing Facility | Attending: Internal Medicine | Admitting: Internal Medicine

## 2024-06-17 ENCOUNTER — Other Ambulatory Visit: Payer: Self-pay

## 2024-06-17 DIAGNOSIS — R319 Hematuria, unspecified: Secondary | ICD-10-CM | POA: Diagnosis not present

## 2024-06-17 DIAGNOSIS — I959 Hypotension, unspecified: Secondary | ICD-10-CM | POA: Diagnosis not present

## 2024-06-17 DIAGNOSIS — Z7189 Other specified counseling: Secondary | ICD-10-CM | POA: Diagnosis not present

## 2024-06-17 DIAGNOSIS — D649 Anemia, unspecified: Secondary | ICD-10-CM | POA: Diagnosis present

## 2024-06-17 DIAGNOSIS — L02211 Cutaneous abscess of abdominal wall: Secondary | ICD-10-CM | POA: Diagnosis not present

## 2024-06-17 DIAGNOSIS — W19XXXA Unspecified fall, initial encounter: Secondary | ICD-10-CM | POA: Diagnosis not present

## 2024-06-17 DIAGNOSIS — N35911 Unspecified urethral stricture, male, meatal: Secondary | ICD-10-CM | POA: Diagnosis not present

## 2024-06-17 DIAGNOSIS — N289 Disorder of kidney and ureter, unspecified: Secondary | ICD-10-CM

## 2024-06-17 DIAGNOSIS — D72829 Elevated white blood cell count, unspecified: Secondary | ICD-10-CM | POA: Diagnosis not present

## 2024-06-17 DIAGNOSIS — Z87440 Personal history of urinary (tract) infections: Secondary | ICD-10-CM | POA: Diagnosis present

## 2024-06-17 DIAGNOSIS — N401 Enlarged prostate with lower urinary tract symptoms: Secondary | ICD-10-CM | POA: Diagnosis present

## 2024-06-17 DIAGNOSIS — I2510 Atherosclerotic heart disease of native coronary artery without angina pectoris: Secondary | ICD-10-CM | POA: Diagnosis not present

## 2024-06-17 DIAGNOSIS — L8990 Pressure ulcer of unspecified site, unspecified stage: Secondary | ICD-10-CM | POA: Diagnosis not present

## 2024-06-17 DIAGNOSIS — M160 Bilateral primary osteoarthritis of hip: Secondary | ICD-10-CM | POA: Diagnosis not present

## 2024-06-17 DIAGNOSIS — E872 Acidosis, unspecified: Secondary | ICD-10-CM | POA: Diagnosis present

## 2024-06-17 DIAGNOSIS — I110 Hypertensive heart disease with heart failure: Secondary | ICD-10-CM | POA: Diagnosis not present

## 2024-06-17 DIAGNOSIS — M47816 Spondylosis without myelopathy or radiculopathy, lumbar region: Secondary | ICD-10-CM | POA: Diagnosis not present

## 2024-06-17 DIAGNOSIS — R296 Repeated falls: Secondary | ICD-10-CM | POA: Diagnosis present

## 2024-06-17 DIAGNOSIS — N3289 Other specified disorders of bladder: Secondary | ICD-10-CM | POA: Diagnosis not present

## 2024-06-17 DIAGNOSIS — Z043 Encounter for examination and observation following other accident: Secondary | ICD-10-CM | POA: Diagnosis not present

## 2024-06-17 DIAGNOSIS — I5032 Chronic diastolic (congestive) heart failure: Secondary | ICD-10-CM | POA: Diagnosis not present

## 2024-06-17 DIAGNOSIS — I771 Stricture of artery: Secondary | ICD-10-CM | POA: Diagnosis not present

## 2024-06-17 DIAGNOSIS — Z952 Presence of prosthetic heart valve: Secondary | ICD-10-CM | POA: Diagnosis not present

## 2024-06-17 DIAGNOSIS — R310 Gross hematuria: Secondary | ICD-10-CM | POA: Diagnosis not present

## 2024-06-17 DIAGNOSIS — N320 Bladder-neck obstruction: Secondary | ICD-10-CM | POA: Diagnosis not present

## 2024-06-17 DIAGNOSIS — S0101XA Laceration without foreign body of scalp, initial encounter: Secondary | ICD-10-CM | POA: Diagnosis not present

## 2024-06-17 DIAGNOSIS — R109 Unspecified abdominal pain: Secondary | ICD-10-CM | POA: Diagnosis not present

## 2024-06-17 DIAGNOSIS — J90 Pleural effusion, not elsewhere classified: Secondary | ICD-10-CM | POA: Diagnosis not present

## 2024-06-17 DIAGNOSIS — N179 Acute kidney failure, unspecified: Secondary | ICD-10-CM | POA: Diagnosis not present

## 2024-06-17 DIAGNOSIS — I7121 Aneurysm of the ascending aorta, without rupture: Secondary | ICD-10-CM | POA: Diagnosis not present

## 2024-06-17 DIAGNOSIS — I878 Other specified disorders of veins: Secondary | ICD-10-CM | POA: Diagnosis not present

## 2024-06-17 DIAGNOSIS — R338 Other retention of urine: Secondary | ICD-10-CM

## 2024-06-17 DIAGNOSIS — M47812 Spondylosis without myelopathy or radiculopathy, cervical region: Secondary | ICD-10-CM | POA: Diagnosis not present

## 2024-06-17 LAB — I-STAT CG4 LACTIC ACID, ED
Lactic Acid, Venous: 2.5 mmol/L (ref 0.5–1.9)
Lactic Acid, Venous: 2.4 mmol/L (ref 0.5–1.9)

## 2024-06-17 LAB — COMPREHENSIVE METABOLIC PANEL WITH GFR
ALT: 7 U/L (ref 0–44)
AST: 18 U/L (ref 15–41)
Albumin: 2.9 g/dL — ABNORMAL LOW (ref 3.5–5.0)
Alkaline Phosphatase: 57 U/L (ref 38–126)
Anion gap: 18 — ABNORMAL HIGH (ref 5–15)
BUN: 38 mg/dL — ABNORMAL HIGH (ref 8–23)
CO2: 16 mmol/L — ABNORMAL LOW (ref 22–32)
Calcium: 8.7 mg/dL — ABNORMAL LOW (ref 8.9–10.3)
Chloride: 106 mmol/L (ref 98–111)
Creatinine, Ser: 2.26 mg/dL — ABNORMAL HIGH (ref 0.61–1.24)
GFR, Estimated: 28 mL/min — ABNORMAL LOW (ref >60)
Glucose, Bld: 132 mg/dL — ABNORMAL HIGH (ref 70–99)
Potassium: 4.8 mmol/L (ref 3.5–5.1)
Sodium: 140 mmol/L (ref 135–145)
Total Bilirubin: 1.9 mg/dL — ABNORMAL HIGH (ref 0.0–1.2)
Total Protein: 5.5 g/dL — ABNORMAL LOW (ref 6.5–8.1)

## 2024-06-17 LAB — CBC WITH DIFFERENTIAL/PLATELET
Abs Immature Granulocytes: 0.34 K/uL — ABNORMAL HIGH (ref 0.00–0.07)
Basophils Absolute: 0.1 K/uL (ref 0.0–0.1)
Basophils Relative: 0 %
Eosinophils Absolute: 0.1 K/uL (ref 0.0–0.5)
Eosinophils Relative: 0 %
HCT: 32.3 % — ABNORMAL LOW (ref 39.0–52.0)
Hemoglobin: 10.1 g/dL — ABNORMAL LOW (ref 13.0–17.0)
Immature Granulocytes: 1 %
Lymphocytes Relative: 4 %
Lymphs Abs: 1.1 K/uL (ref 0.7–4.0)
MCH: 29.9 pg (ref 26.0–34.0)
MCHC: 31.3 g/dL (ref 30.0–36.0)
MCV: 95.6 fL (ref 80.0–100.0)
Monocytes Absolute: 1.6 K/uL — ABNORMAL HIGH (ref 0.1–1.0)
Monocytes Relative: 6 %
Neutro Abs: 24 K/uL — ABNORMAL HIGH (ref 1.7–7.7)
Neutrophils Relative %: 89 %
Platelets: 218 K/uL (ref 150–400)
RBC: 3.38 MIL/uL — ABNORMAL LOW (ref 4.22–5.81)
RDW: 15.6 % — ABNORMAL HIGH (ref 11.5–15.5)
Smear Review: NORMAL
WBC: 27.2 K/uL — ABNORMAL HIGH (ref 4.0–10.5)
nRBC: 0 % (ref 0.0–0.2)

## 2024-06-17 LAB — URINALYSIS, W/ REFLEX TO CULTURE (INFECTION SUSPECTED): RBC / HPF: >50 RBC/hpf (ref 0–5)

## 2024-06-17 LAB — C-REACTIVE PROTEIN: CRP: 5.7 mg/dL — ABNORMAL HIGH (ref <1.0)

## 2024-06-17 LAB — LACTIC ACID, PLASMA: Lactic Acid, Venous: 0.7 mmol/L (ref 0.5–1.9)

## 2024-06-17 LAB — CK: Total CK: 35 U/L — ABNORMAL LOW (ref 49–397)

## 2024-06-17 LAB — SEDIMENTATION RATE: Sed Rate: 10 mm/h (ref 0–16)

## 2024-06-17 MED ORDER — SODIUM CHLORIDE 0.9% FLUSH
3.0000 mL | Freq: Two times a day (BID) | INTRAVENOUS | Status: DC
  Administered 2024-06-17 – 2024-06-26 (×14): 3 mL via INTRAVENOUS

## 2024-06-17 MED ORDER — SODIUM CHLORIDE 0.9 % IV SOLN
2.0000 g | INTRAVENOUS | Status: DC
  Administered 2024-06-18 – 2024-06-21 (×4): 2 g via INTRAVENOUS
  Filled 2024-06-17 (×4): qty 12.5

## 2024-06-17 MED ORDER — SODIUM CHLORIDE 0.9 % IV SOLN: 1.0000 g | Freq: Once | INTRAVENOUS | Status: DC

## 2024-06-17 MED ORDER — PANTOPRAZOLE SODIUM 40 MG PO TBEC
40.0000 mg | DELAYED_RELEASE_TABLET | Freq: Every day | ORAL | Status: DC
  Administered 2024-06-17 – 2024-06-26 (×10): 40 mg via ORAL
  Filled 2024-06-17 (×10): qty 1

## 2024-06-17 MED ORDER — ASPIRIN 81 MG PO CHEW
81.0000 mg | CHEWABLE_TABLET | Freq: Every day | ORAL | Status: DC
  Administered 2024-06-17 – 2024-06-18 (×2): 81 mg via ORAL
  Filled 2024-06-17 (×2): qty 1

## 2024-06-17 MED ORDER — VANCOMYCIN HCL 2000 MG/400ML IV SOLN
2000.0000 mg | Freq: Once | INTRAVENOUS | Status: AC
  Administered 2024-06-17: 2000 mg via INTRAVENOUS
  Filled 2024-06-17: qty 400

## 2024-06-17 MED ORDER — VANCOMYCIN HCL 1500 MG/300ML IV SOLN
1500.0000 mg | INTRAVENOUS | Status: DC
  Administered 2024-06-19 – 2024-06-21 (×2): 1500 mg via INTRAVENOUS
  Filled 2024-06-17 (×2): qty 300

## 2024-06-17 MED ORDER — TAMSULOSIN HCL 0.4 MG PO CAPS
0.4000 mg | ORAL_CAPSULE | Freq: Every day | ORAL | Status: DC
  Administered 2024-06-17 – 2024-06-26 (×10): 0.4 mg via ORAL
  Filled 2024-06-17 (×10): qty 1

## 2024-06-17 MED ORDER — ALBUTEROL SULFATE (2.5 MG/3ML) 0.083% IN NEBU: 2.5000 mg | INHALATION_SOLUTION | Freq: Four times a day (QID) | RESPIRATORY_TRACT | Status: DC | PRN

## 2024-06-17 MED ORDER — LIDOCAINE HCL (PF) 1 % IJ SOLN
5.0000 mL | Freq: Once | INTRAMUSCULAR | Status: AC
  Administered 2024-06-17: 5 mL via INTRADERMAL
  Filled 2024-06-17: qty 5

## 2024-06-17 MED ORDER — TETANUS-DIPHTH-ACELL PERTUSSIS 5-2-15.5 LF-MCG/0.5 IM SUSP
0.5000 mL | Freq: Once | INTRAMUSCULAR | Status: AC
  Administered 2024-06-17: 0.5 mL via INTRAMUSCULAR
  Filled 2024-06-17: qty 0.5

## 2024-06-17 MED ORDER — SODIUM CHLORIDE 0.9 % IV SOLN: INTRAVENOUS | Status: DC

## 2024-06-17 MED ORDER — ALLOPURINOL 300 MG PO TABS
300.0000 mg | ORAL_TABLET | Freq: Every day | ORAL | Status: DC
  Administered 2024-06-17 – 2024-06-26 (×10): 300 mg via ORAL
  Filled 2024-06-17 (×4): qty 1
  Filled 2024-06-17: qty 3
  Filled 2024-06-17 (×5): qty 1

## 2024-06-17 MED ORDER — CHLORHEXIDINE GLUCONATE CLOTH 2 % EX PADS
6.0000 | MEDICATED_PAD | Freq: Every day | CUTANEOUS | Status: DC
  Administered 2024-06-17 – 2024-06-26 (×8): 6 via TOPICAL

## 2024-06-17 MED ORDER — HEPARIN SODIUM (PORCINE) 5000 UNIT/ML IJ SOLN
5000.0000 [IU] | Freq: Three times a day (TID) | INTRAMUSCULAR | Status: DC
  Administered 2024-06-17 (×2): 5000 [IU] via SUBCUTANEOUS
  Filled 2024-06-17 (×2): qty 1

## 2024-06-17 MED ORDER — SODIUM CHLORIDE 0.9 % IV SOLN
2.0000 g | Freq: Once | INTRAVENOUS | Status: AC
  Administered 2024-06-17: 2 g via INTRAVENOUS
  Filled 2024-06-17: qty 12.5

## 2024-06-17 MED ORDER — ONDANSETRON HCL 4 MG/2ML IJ SOLN: 4.0000 mg | Freq: Four times a day (QID) | INTRAMUSCULAR | Status: DC | PRN

## 2024-06-17 MED ORDER — SODIUM CHLORIDE 0.9 % IV BOLUS
1000.0000 mL | Freq: Once | INTRAVENOUS | Status: AC
  Administered 2024-06-17: 1000 mL via INTRAVENOUS

## 2024-06-17 MED ORDER — MECLIZINE HCL 25 MG PO TABS
25.0000 mg | ORAL_TABLET | Freq: Three times a day (TID) | ORAL | Status: DC | PRN
  Filled 2024-06-17: qty 1

## 2024-06-17 MED ORDER — FINASTERIDE 5 MG PO TABS
5.0000 mg | ORAL_TABLET | Freq: Every day | ORAL | Status: DC
  Administered 2024-06-17 – 2024-06-26 (×10): 5 mg via ORAL
  Filled 2024-06-17 (×11): qty 1

## 2024-06-17 MED ORDER — ONDANSETRON HCL 4 MG PO TABS: 4.0000 mg | ORAL_TABLET | Freq: Four times a day (QID) | ORAL | Status: DC | PRN

## 2024-06-17 MED ORDER — ACETAMINOPHEN 325 MG PO TABS
650.0000 mg | ORAL_TABLET | Freq: Four times a day (QID) | ORAL | Status: DC | PRN
  Administered 2024-06-18 – 2024-06-25 (×3): 650 mg via ORAL
  Filled 2024-06-17 (×3): qty 2

## 2024-06-17 MED ORDER — ACETAMINOPHEN 650 MG RE SUPP: 650.0000 mg | Freq: Four times a day (QID) | RECTAL | Status: DC | PRN

## 2024-06-17 NOTE — ED Triage Notes (Signed)
 Pt to ED via [facility] from home. Pt has had a few falls recently d/t increasing weakness. Pt fell and hit head, 2 lacerations w/minimal bleeding noted. EMS reports orthostatic w/BP 60/palp. Pt discharged yesterday for AKI. Pt does not take blood thinners. When pt fell at 7pm last night, family noted blood in foley bag. Pt A&Ox4 on arrival, c/o being cold.   90 NSR 127/42

## 2024-06-17 NOTE — ED Provider Notes (Signed)
 Patient signed out to myself at time of shift change by Olam Slocumb PA-C at time of shift change, please see her note for further detail. Briefly patient is an 83yom who was discharged from hospital yesterday from a hospital admission where he was admitted with acute cystitis, AKI, acute urinary retention, bladder calculi, and prostatic hypertrophy. Since then per previous provider patient has had two falls and also has a significant leukocytosis which has developed since yesterday as well as lactic acidosis. Patient has been covered with broad spectrum antibiotics as well as fluid resuscitation in the ED and is currently pending hospitalist consult for readmission. Suspect possible urinary source of infection at this time, foley was changed per previous EDP and UA is currently in process. Due to one of the incidences of fall patient did sustain a head laceration which was repaired by previous EDP and was noted to be orthostatic per EMS report on initial arrival. Per family blood was also noted in foley bag last PM. Patient AxO x 4 on arrival per triage note.   Vital signs are noted to be stable with no tachycardia and no fever. Imaging overall reassuring with CT cervical spine only show trace layering pleural effusions.   According to chart review initial page for admission placed at approximately 5:30am, I personally spoke with the secretary at 7am regarding this page and was informed that the hospitalist would be calling shortly. Spoke with hospitalist at 8:55am regarding admission Dr. Claudene, who agrees with admission to the hospital.     Janetta Terrall FALCON, [place] 06/17/24 1607    Haze Lonni PARAS, MD 06/18/24 [phone]

## 2024-06-17 NOTE — ED Provider Notes (Signed)
 [place] EMERGENCY DEPARTMENT AT [facility] Provider Note   CSN: [id number] Arrival date & time: 06/17/24  9670     Patient presents with: Drew Adams   Drew Adams is a 83 y.o. male.   The history is provided by the patient and medical records.  Fall   83 y.o. M with hx of CKD, urinary retention, CAD, HTN, prior TIA, presenting to the ED after fall x2 this evening.  Patient was just discharged from the hospital yesterday after admission for AKI and urinary retention.  Family reports first fall around 7:30PM, was lowered to the ground and did not have trauma during that time. Family did notice some blood in the foley bag after first fall. Clemens again this morning, sustained laceration to top of scalp.  He is not on anticoagulation.  Noted to be orthostatic with EMS.  Prior to Admission medications   Medication Sig Start Date End Date Taking? Authorizing Provider  allopurinol  (ZYLOPRIM ) 300 MG tablet Take 300 mg by mouth daily. 11/15/12   [provider]  Ascorbic Acid  (VITAMIN C ) 1000 MG tablet Take 1,000 mg by mouth daily.    [provider]  aspirin  81 MG chewable tablet Chew 1 tablet (81 mg total) by mouth daily. 06/01/24   Sebastian Lamarr SAUNDERS, PA-C  calcium  citrate (CALCITRATE - DOSED IN MG ELEMENTAL CALCIUM ) 950 MG tablet Take 600 mg by mouth daily.    [provider]  cephALEXin  (KEFLEX ) 500 MG capsule Take 1 capsule (500 mg total) by mouth 2 (two) times daily for 3 days. 06/15/24 06/19/24  Singh, Prashant K, MD  cyanocobalamin  (VITAMIN B12) 1000 MCG tablet Take 1,000 mcg by mouth daily.    [provider]  finasteride  (PROSCAR ) 5 MG tablet Take 5 mg by mouth daily.    [provider]  hydrocortisone cream 1 % Apply 1 Application topically daily.    [provider]  MAGNESIUM  GLYCINATE PO Take 400 mg by mouth daily.    [provider]  meclizine  (ANTIVERT ) 25 MG tablet Take 1 tablet (25 mg total) by mouth 3  (three) times daily as needed for dizziness. 02/23/24   Mannie Fairy DASEN, DO  Multiple Vitamin (MULTIVITAMIN WITH MINERALS) TABS Take 1 tablet by mouth daily. Mature    [provider]  OVER THE COUNTER MEDICATION Take 1 tablet by mouth daily. prostacor    [provider]  oxymetazoline  (AFRIN) 0.05 % nasal spray Place 1 spray into both nostrils 2 (two) times daily. 06/15/24   Singh, Prashant K, MD  pantoprazole  (PROTONIX ) 40 MG tablet Take 1 tablet (40 mg total) by mouth daily. 06/15/24   Singh, Prashant K, MD  predniSONE  (DELTASONE ) 5 MG tablet Take 5 mg by mouth daily as needed (Gout). 01/07/18   [provider]  psyllium (METAMUCIL) 58.6 % packet Take 1 packet by mouth daily. 10 ml    [provider]  PURE L-ARGININE HCL PO Take 1,800 mg by mouth daily.    [provider]  sodium chloride  (OCEAN) 0.65 % SOLN nasal spray Place 1 spray into both nostrils 4 (four) times daily. 06/15/24   Dennise Lavada POUR, MD  tamsulosin  (FLOMAX ) 0.4 MG CAPS capsule Take 1 capsule (0.4 mg total) by mouth daily. 06/15/24   Singh, Prashant K, MD  Zinc  Gluconate 15 MG TABS Take 50 mg by mouth daily. Plus copper 2 mg    [provider]    Allergies: Crestor  Gordianus.gobble ], Exforge [amlodipine besylate-valsartan], and Simvastatin  Review of Systems  Skin:  Positive for wound.  All other systems reviewed and are negative.   Updated Vital Signs BP (!) 136/106 (BP Location: Right Arm)   Pulse 94   Temp 97.9 F (36.6 C) (Oral)   Resp 18   Ht 5' 10 (1.778 m)   Wt 81.6 kg   SpO2 100%   BMI 25.83 kg/m   Physical Exam Vitals and nursing note reviewed.  Constitutional:      Appearance: He is well-developed.     Comments: Thin in appearance  HENT:     Head: Normocephalic and atraumatic.     Comments: S-shaped 4cm laceration to top of scalp, no active bleeding, secondary wounds to left parietal scalp, blood is dried, no active bleeding observed Eyes:      Conjunctiva/sclera: Conjunctivae normal.     Pupils: Pupils are equal, round, and reactive to light.  Cardiovascular:     Rate and Rhythm: Normal rate and regular rhythm.     Heart sounds: Normal heart sounds.  Pulmonary:     Effort: Pulmonary effort is normal.     Breath sounds: Normal breath sounds.  Abdominal:     General: Bowel sounds are normal.     Palpations: Abdomen is soft.  Genitourinary:    Comments: Foley in place, dried blood present around meatus and along foley tubing, some blood collected in foley bag Musculoskeletal:        General: Normal range of motion.     Cervical back: Normal range of motion.     Comments: Pelvis is stable, no leg shortening or malrotation, DP pulses intact BLE  Skin:    General: Skin is warm and dry.  Neurological:     Mental Status: He is alert and oriented to person, place, and time.     Comments: Awake, alert, able to state name, location, and months; moving extremities without focal deficits, following commands when prompted     (all labs ordered are listed, but only abnormal results are displayed) Labs Reviewed  CBC WITH DIFFERENTIAL/PLATELET - Abnormal; Notable for the following components:      Result Value   WBC 27.2 (*)    RBC 3.38 (*)    Hemoglobin 10.1 (*)    HCT 32.3 (*)    RDW 15.6 (*)    Neutro Abs 24.0 (*)    Monocytes Absolute 1.6 (*)    Abs Immature Granulocytes 0.34 (*)    All other components within normal limits  COMPREHENSIVE METABOLIC PANEL WITH GFR - Abnormal; Notable for the following components:   CO2 16 (*)    Glucose, Bld 132 (*)    BUN 38 (*)    Creatinine, Ser 2.26 (*)    Calcium  8.7 (*)    Total Protein 5.5 (*)    Albumin  2.9 (*)    Total Bilirubin 1.9 (*)    GFR, Estimated 28 (*)    Anion gap 18 (*)    All other components within normal limits  I-STAT CG4 LACTIC ACID, ED - Abnormal; Notable for the following components:   Lactic Acid, Venous 2.5 (*)    All other components within normal  limits  CULTURE, BLOOD (ROUTINE X 2)  CULTURE, BLOOD (ROUTINE X 2)  URINALYSIS, W/ REFLEX TO CULTURE (INFECTION SUSPECTED)  I-STAT CG4 LACTIC ACID, ED    EKG: None  Radiology: CT Cervical Spine Wo Contrast Result Date: 06/17/2024 EXAM: CT CERVICAL SPINE WITHOUT CONTRAST 06/17/2024 04:04:20 AM TECHNIQUE: CT of the cervical spine  was performed without the administration of intravenous contrast. Multiplanar reformatted images are provided for review. Automated exposure control, iterative reconstruction, and/or weight based adjustment of the mA/kV was utilized to reduce the radiation dose to as low as reasonably achievable. COMPARISON: Head CT 06/17/2024, reported separately. CLINICAL HISTORY: 83 year old male with multiple recent falls. FINDINGS: CERVICAL SPINE: BONES AND ALIGNMENT: Straightening of cervical lordosis. No acute fracture or traumatic malalignment. Mineralization is heterogeneous but within normal limits for age. Previously intact visible upper thoracic levels. DEGENERATIVE CHANGES: Degenerative cervical ankylosis at the C4-C5 and C6-C7 levels. Bulky chronic craniocervical junction and anterior C1-C2 degeneration with osteophytosis and sclerosis there. Widespread chronic cervical facet arthropathy. SOFT TISSUES: No prevertebral soft tissue swelling. Mild for age calcified atherosclerosis of the cervical carotid arteries. VISUALIZED LUNG APICES: Trace layering pleural fluid with otherwise negative visible lung apices. IMPRESSION: 1. No acute traumatic injury identified in the cervical spine. 2. Cervical spine degeneration superimposed on Degenerative ankylosis of C4-C5 and C6-C7. 3. Trace layering pleural effusions. Electronically signed by: Helayne Hurst MD 06/17/2024 04:16 AM EST RP Workstation: [id number]   CT Head Wo Contrast Result Date: 06/17/2024 EXAM: CT HEAD WITHOUT CONTRAST 06/17/2024 04:04:20 AM TECHNIQUE: CT of the head was performed without the administration of intravenous  contrast. Automated exposure control, iterative reconstruction, and/or weight based adjustment of the mA/kV was utilized to reduce the radiation dose to as low as reasonably achievable. COMPARISON: Brain MRI 10/14/2018. Head CT 09/02/2018. CLINICAL HISTORY: 83 year old male with ataxia, head trauma, recent falls, and scalp lacerations. FINDINGS: BRAIN AND VENTRICLES: No acute hemorrhage. No evidence of acute infarct. No hydrocephalus. No extra-axial collection. No mass effect or midline shift. Brain volume and chronic subarachnoid CSF in the setting of the volume loss have not significantly changed. Chronic calcified left MCA branch atherosclerosis. Small area of chronic appearing cortical encephalomalacia at the posterior left frontal operculum is new since 2020. Small superior left perirolandic cortex encephalomalacia on previous MRI redemonstrated on series 3 image 9. Elsewhere gray white differentiation is within normal limits for age. No cerebral edema identified. No suspicious intracranial vascular hyperdensity. Progressed chronic small to medium sized vessel ischemia in the left MCA territory since 2020. ORBITS: No acute abnormality. SINUSES: Paranasal sinuses, middle ears and mastoids remain well aerated. SOFT TISSUES AND SKULL: Scalp lacerations. No skull fracture. Chronic craniocervical junction degeneration. Calcified atherosclerosis at the skull base. IMPRESSION: 1. No acute traumatic injury identified. 2. No acute intracranial abnormality. Progressed chronic small to medium sized vessel ischemia in the left MCA territory since 2020. Electronically signed by: Helayne Hurst MD 06/17/2024 04:13 AM EST RP Workstation: [id number]   DG Pelvis Portable Result Date: 06/17/2024 EXAM: 1 VIEW(S) XRAY OF THE PELVIS 06/17/2024 03:54:21 AM COMPARISON: None available. CLINICAL HISTORY: fall x2 FINDINGS: BONES AND JOINTS: No acute fracture. No joint dislocation. Mild bilateral hip DJD. Degenerative changes in the  lumbar spine. SOFT TISSUES: Right pelvic phleboliths. Femoral arterial calcifications. IMPRESSION: 1. No acute osseous abnormality related to recent falls. Electronically signed by: Oneil Devonshire MD 06/17/2024 04:00 AM EST RP Workstation: [id number]   DG Chest Port 1 View Result Date: 06/17/2024 EXAM: 1 VIEW(S) XRAY OF THE CHEST 06/17/2024 03:52:22 AM COMPARISON: 05/26/2024 CLINICAL HISTORY: fall x2 FINDINGS: LUNGS AND PLEURA: No focal pulmonary opacity. No pleural effusion. No pneumothorax. HEART AND MEDIASTINUM: Tortuous thoracic aorta. Aneurysmal ascending thoracic aorta. TAVR noted. BONES AND SOFT TISSUES: Thoracic dextroscoliosis. IMPRESSION: 1. No acute cardiopulmonary process. Electronically signed by: Oneil Devonshire MD 06/17/2024 03:59 AM EST RP Workstation:  [id number]     Procedures   CRITICAL CARE Performed by: Olam CHRISTELLA Slocumb   Total critical care time: 45 minutes  Critical care time was exclusive of separately billable procedures and treating other patients.  Critical care was necessary to treat or prevent imminent or life-threatening deterioration.  Critical care was time spent personally by me on the following activities: development of treatment plan with patient and/or surrogate as well as nursing, discussions with consultants, evaluation of patient's response to treatment, examination of patient, obtaining history from patient or surrogate, ordering and performing treatments and interventions, ordering and review of laboratory studies, ordering and review of radiographic studies, pulse oximetry and re-evaluation of patient's condition.   LACERATION REPAIR Performed by: Olam CHRISTELLA Slocumb Authorized by: Olam CHRISTELLA Slocumb Consent: Verbal consent obtained. Risks and benefits: risks, benefits and alternatives were discussed Consent given by: patient Patient identity confirmed: provided demographic data Prepped and Draped in normal sterile fashion Wound explored  Laceration Location:  top of scalp  Laceration Length: 4cm  No Foreign Bodies seen or palpated  Anesthesia: local infiltration  Local anesthetic: lidocaine  1% without epinephrine   Anesthetic total: 5 ml  Irrigation method: syringe Amount of cleaning: standard  Skin closure: 4-0 prolene  Number of sutures: 4  Technique: simple interrupted  Patient tolerance: Patient tolerated the procedure well with no immediate complications.   Medications Ordered in the ED  ceFEPIme  (MAXIPIME ) 2 g in sodium chloride  0.9 % 100 mL IVPB (2 g Intravenous New Bag/Given 06/17/24 0615)  vancomycin  (VANCOREADY) IVPB 2000 mg/400 mL (has no administration in time range)  lidocaine  (PF) (XYLOCAINE ) 1 % injection 5 mL (5 mLs Intradermal Given by Other 06/17/24 0416)  Tdap (ADACEL ) injection 0.5 mL (0.5 mLs Intramuscular Given 06/17/24 0414)  sodium chloride  0.9 % bolus 1,000 mL (1,000 mLs Intravenous New Bag/Given 06/17/24 0413)                                 Medical Decision Making Amount and/or Complexity of Data Reviewed Labs: ordered. Radiology: ordered and independent interpretation performed. ECG/medicine tests: ordered and independent interpretation performed.  Risk Prescription drug management. Decision regarding hospitalization.   83 year old male presenting to the ED after fall x 2 at home.  First fall was controlled by family and lowered to the floor without injury.  Had subsequent fall early this morning with head injury.  No reported loss of consciousness.  Laceration to the scalp.  Noted to be orthostatic with EMS.  Patient just discharged from the hospital yesterday due to renal failure and urinary retention.  He is awake, alert, oriented here.  He has an S shaped laceration to top of the scalp without any significant bleeding.  Secondary abrasions to the left parietal scalp, blood is dried without any active bleeding.  He has no pelvic tenderness or leg shortening.  Vitals are stable on room air  currently.  Plan for CT head neck, chest and pelvis films.  Will repeat screening labs and UA.  Tetanus was updated.  Will need laceration repair.  Started IVF given orthostasis with EMS.  Laceration repaired as above, tolerated fairly well.  WBC count 27K, was 10.7 yesterday.  No fever at home per family, afebrile here.  Not currently on any steroids.  Is on keflex  for presumed UTI but negative recent cultures.  Will add on lactic and cultures.  CMP with SrCr 2.26.  Foley still not draining, will exchange  as I am concerned this was pulled during his fall given blood around meatus and in urine bag.  Lactic 2.5.  has IVF infusing already.  Will expand abx coverage to vanc/cefepime  given significant leukocytosis and lactic acidosis today.  Blood cultures have been sent.  Discussion with son-in-law at bedside, this is an acute change from time of discharge.  I feel this warrants repeat admission.  Patient and family agreeable.  6:39 AM Awaiting call back for admission for over an hour.  Care signed out to oncoming provider pending admission call.  Final diagnoses:  Leukocytosis, unspecified type  Recurrent falls    ED Discharge Orders     None          Jarold Olam HERO, PA-C 06/17/24 0640    Haze Lonni PARAS, MD 06/17/24 [phone]

## 2024-06-17 NOTE — ED Notes (Signed)
 Patient transported to CT

## 2024-06-17 NOTE — H&P (Incomplete)
 History and Physical    Patient: Drew Adams FMW:[id number] DOB: Nov 12, 1940 DOA: 06/17/2024 DOS: the patient was seen and examined on 06/17/2024 PCP: Stacia Millman, PA  Patient coming from: Home via EMS  Chief Complaint:  Chief Complaint  Patient presents with   Fall   HPI: Drew Adams is a 83 y.o. male with medical history significant of aortic stenosis s/p TAVR 05/30/2024, hyperlipidemia, CAD, chronic kidney disease, BPH with urinary retention presents with recurrent falls and urinary issues. He is accompanied by his son who helps to provide history.  He underwent a TAVR procedure on November 4th and was fully ambulatory prior to the procedure. Post-procedure, he began feeling lethargic and spent more time in bed, although he remained ambulatory. On November 11th, during a follow-up with his cardiology PA, he reported urinary incontinence but was unable to provide a urine sample. By the following weekend, he was unable to urinate, prompting a visit to urgent care and subsequently to the hospital. Patient had just recently been hospitalized 11/16 - 11/21 after present in acute renal failure secondary urinary retention requiring Foley catheter placement with concern for cystitis.  He was treated with Rocephin  while in the hospital, but was sent home to continue oral Keflex  for 3 more days with outpatient urology follow-up in 7 to 10 days.  He has experienced two falls since being discharged from the hospital the previous day. During the first fall, he was eased to the ground by family members.  Foley bag had been empty and after fall there was note of a little bit of blood present.  During the second fall, he was found on the floor after attempting to reach for his ginger ale on his nightstand. He may have hit his head on the corner of the nightstand or handle during the second fall. No pain or seizure activity was reported following the falls. No lightheadedness or dizziness. No bladder  tenderness or fullness.  He lives alone and has been managing his activities of daily living independently, although he has faced challenges since the passing of his wife a year and a half ago. His son notes that he has a structured routine, including dietary preferences such as caffeine -free, sugar-free ginger ale, and specific types of products. He reports regular bowel movements, with the last one occurring the previous day. He was not using any assistive devices at home prior to the recent hospital discharge, but was discharged with a bedside commode and a rolling walker.  In route with EMS patient reported to have been orthostatic.  In the ED patient was noted to be afebrile with fairly stable vital signs.  Labs significant for WBC 27.2, hemoglobin 10.1, CO2 16, BUN 38, creatinine 2.26, and anion gap 18.  X-rays of the chest and pelvis did not note any acute abnormality.  CT imaging of the head and cervical spine showed no acute intercranial or traumatic injury.  Urinalysis noted rare bacteria, greater than 50 RBC/hpf, 0-5 WBCs, and several tests unable to be analyzed due to specimen being bloody.  Blood cultures were obtained.  Head laceration was repaired by the ED provider with 4 sutures.  Patient had been given a Tdap booster, 1 L normal saline IV fluids, vancomycin , and cefepime .    Review of Systems: As mentioned in the history of present illness. All other systems reviewed and are negative. Past Medical History:  Diagnosis Date   Aortic stenosis    s/p TAVR on 05/30/24   Aortic valve disease  BPH (benign prostatic hyperplasia)    Diverticulosis of colon without hemorrhage 11/28/2012   ED (erectile dysfunction)    Elevated PSA    Gout    uses prednisone  1-2x/week   Hypertension    Iron deficiency anemia 11/28/2012   Mitral valve disease    Mixed hyperlipidemia    Ocular migraine    Paraesophageal hiatal hernia 11/28/2012   Radiculopathy    Thoracic aortic aneurysm (TAA)     Past Surgical History:  Procedure Laterality Date   CATARACT EXTRACTION Right 2012   CATARACT EXTRACTION Left Jan 2014   CORONARY ANGIOGRAPHY N/A 05/03/2024   Procedure: CORONARY ANGIOGRAPHY;  Surgeon: Verlin Lonni BIRCH, MD;  Location: [facility];  Service: Cardiovascular;  Laterality: N/A;   HERNIA REPAIR Right 1970s   open RIH repair   INTRAOPERATIVE TRANSTHORACIC ECHOCARDIOGRAM N/A 05/30/2024   Procedure: ECHOCARDIOGRAM, TRANSTHORACIC;  Surgeon: Wendel Lurena POUR, MD;  Location: [facility];  Service: Cardiovascular;  Laterality: N/A;   LAPAROSCOPIC PARTIAL COLECTOMY N/A 12/27/2012   Procedure: LAPAROSCOPIC  PARTIAL COLECTOMY RIGID PROCTOSCOPY ;  Surgeon: Elspeth KYM Schultze, MD;  Location: [facility];  Service: General;  Laterality: N/A;   LAPAROSCOPIC SIGMOID COLECTOMY  12/27/2012   TONSILLECTOMY AND ADENOIDECTOMY     Social History:  reports that he has never smoked. He has never used smokeless tobacco. He reports that he does not drink alcohol and does not use drugs.  Allergies  Allergen Reactions   Crestor  [Rosuvastatin ] Other (See Comments)    Leg pain and cramping   Exforge [Amlodipine Besylate-Valsartan] Other (See Comments)    Myalgia    Simvastatin Other (See Comments)    Family History  Problem Relation Age of Onset   Heart attack Mother    CAD Mother    Parkinson's disease Father    Diabetes Father    Cancer Sister    Diabetes Sister     Prior to Admission medications   Medication Sig Start Date End Date Taking? Authorizing Provider  cephALEXin  (KEFLEX ) 500 MG capsule Take 1 capsule (500 mg total) by mouth 2 (two) times daily for 3 days. 06/15/24 06/19/24 Yes Dennise Lavada POUR, MD  allopurinol  (ZYLOPRIM ) 300 MG tablet Take 300 mg by mouth daily. 11/15/12   [provider]  Ascorbic Acid  (VITAMIN C ) 1000 MG tablet Take 1,000 mg by mouth daily.    [provider]  aspirin  81 MG chewable tablet Chew 1 tablet (81 mg total) by mouth daily.  06/01/24   Sebastian Lamarr SAUNDERS, PA-C  calcium  citrate (CALCITRATE - DOSED IN MG ELEMENTAL CALCIUM ) 950 MG tablet Take 600 mg by mouth daily.    [provider]  cyanocobalamin  (VITAMIN B12) 1000 MCG tablet Take 1,000 mcg by mouth daily.    [provider]  finasteride  (PROSCAR ) 5 MG tablet Take 5 mg by mouth daily.    [provider]  hydrocortisone cream 1 % Apply 1 Application topically daily.    [provider]  MAGNESIUM  GLYCINATE PO Take 400 mg by mouth daily.    [provider]  meclizine  (ANTIVERT ) 25 MG tablet Take 1 tablet (25 mg total) by mouth 3 (three) times daily as needed for dizziness. 02/23/24   Mannie Fairy DASEN, DO  Multiple Vitamin (MULTIVITAMIN WITH MINERALS) TABS Take 1 tablet by mouth daily. Mature    [provider]  OVER THE COUNTER MEDICATION Take 1 tablet by mouth daily. prostacor    [provider]  oxymetazoline  (AFRIN) 0.05 %  nasal spray Place 1 spray into both nostrils 2 (two) times daily. 06/15/24   Singh, Prashant K, MD  pantoprazole  (PROTONIX ) 40 MG tablet Take 1 tablet (40 mg total) by mouth daily. 06/15/24   Singh, Prashant K, MD  predniSONE  (DELTASONE ) 5 MG tablet Take 5 mg by mouth daily as needed (Gout). 01/07/18   [provider]  psyllium (METAMUCIL) 58.6 % packet Take 1 packet by mouth daily. 10 ml    [provider]  PURE L-ARGININE HCL PO Take 1,800 mg by mouth daily.    [provider]  sodium chloride  (OCEAN) 0.65 % SOLN nasal spray Place 1 spray into both nostrils 4 (four) times daily. 06/15/24   Singh, Prashant K, MD  tamsulosin  (FLOMAX ) 0.4 MG CAPS capsule Take 1 capsule (0.4 mg total) by mouth daily. 06/15/24   Singh, Prashant K, MD  Zinc  Gluconate 15 MG TABS Take 50 mg by mouth daily. Plus copper 2 mg    [provider]    Physical Exam: Vitals:   06/17/24 0600 06/17/24 0630 06/17/24 0645 06/17/24 0715  BP: (!) 112/54 119/63 (!) 119/56 (!) 113/55   Pulse: 65 90 86 92  Resp: 13 19 12 13   Temp:      TempSrc:      SpO2: 100% 100% 95% 100%  Weight:      Height:        Constitutional: NAD, calm, comfortable Eyes: PERRL, lids and conjunctivae normal ENMT: Mucous membranes are moist. Posterior pharynx clear of any exudate or lesions.Normal dentition.  Neck: normal, supple, no masses, no thyromegaly Respiratory: clear to auscultation bilaterally, no wheezing, no crackles. Normal respiratory effort. No accessory muscle use.  Cardiovascular: Regular rate and rhythm, no murmurs / rubs / gallops. No extremity edema. 2+ pedal pulses. No carotid bruits.  Abdomen: no tenderness, no masses palpated. No hepatosplenomegaly. Bowel sounds positive.  Musculoskeletal: no clubbing / cyanosis. No joint deformity upper and lower extremities. Good ROM, no contractures. Normal muscle tone.  Skin: Scalp laceration present with 4 sutures in place Neurologic: CN 2-12 grossly intact. Sensation intact, DTR normal. Strength 5/5 in all 4.  Psychiatric: Normal judgment and insight. Alert and oriented x 3. Normal mood.   Data Reviewed: {Tip this will not be part of the note when signed- Document your independent interpretation of telemetry tracing, EKG, lab, Radiology test or any other diagnostic tests. Add any new diagnostic test ordered today. (Optional):[id number]} EKG reveals sinus rhythm at 86 bpm with significant background artifact.  Reviewed labs, imaging, and pertinent labs as documented  Assessment and Plan:   Falls, at home Head laceration Acute.  Patient presents after having 2 falls at home.  He has been recommended to use a rolling walker and home health.  No focal deficits noted on physical exam.  Question if patient is safe to be at home alone.  May need short rehab stay. - Admit to a telemetry bed - Up with assistance - Check orthostatic vital signs - Recommend suture removal in 7 to 10 days. - PT/OT to eval and treat - Transition of  care  Leukocytosis Lactic acidosis Acute.  On admission WBC elevated at 27.2 with lactic acid 2.5->2.4.  Cultures were obtained.  Patient has been started on 30 mL of vancomycin  and cefepime . - Follow-up blood cultures - Check ESR and CRP - Reassess in a.m. and determine need of continuation of antibiotic recs.  Urinary retention due to BPH Renal insufficiency On admission creatinine noted to be 2.26  with BUN 38.   Patient has been found to have acute urinary retention during last hospitalization with initial creatinine noted to be elevated up to 13.7 on 11/16.  He had Foley catheter placed and prior to discharge creatinine was down to 2.09 on 11/21.  - Continue Foley catheter - Continue finasteride  and tamsulosin   Hematuria History of cystitis Patient was treated for the possibility of urinary tract infection during last hospitalization, but urine culture did not grow any bacteria.  Plan was for patient to continue cephalexin  for 3 more days.  Repeat urinalysis on admission noted rare bacteria, greater than 50 RBC/hpf, 0-5 WBCs.  Patient had been given a dose of cefepime .  Suspect hematuria is traumatic in nature with recent falls. - Check urine culture - Irrigate Foley catheter - Continue cefepime    Severe aortic stenosis S/p TAVR History of CAD Patient just recently underwent TAVR on 05/30/24.  Patient has a history of being intolerant to statins. - Continue aspirin    Normocytic anemia Hemoglobin noted to be 10.1. - Continue to monitor.  DVT prophylaxis: Heparin  Advance Care Planning:   Code Status: Full Code   Consults: ***  Family Communication: Son updated at bedside Severity of Illness: The appropriate patient status for this patient is OBSERVATION. Observation status is judged to be reasonable and necessary in order to provide the required intensity of service to ensure the patient's safety. The patient's presenting symptoms, physical exam findings, and initial  radiographic and laboratory data in the context of their medical condition is felt to place them at decreased risk for further clinical deterioration. Furthermore, it is anticipated that the patient will be medically stable for discharge from the hospital within 2 midnights of admission.   Author: Maximino DELENA Sharps, MD 06/17/2024 8:41 AM  For on call review [email or web address].

## 2024-06-17 NOTE — Progress Notes (Signed)
 Pharmacy Antibiotic Note  Drew Adams is a 83 y.o. male admitted on 06/17/2024 with sepsis.  Pharmacy has been consulted for vancomycin  dosing. Also on cefepime   Plan: Vancomycin  2g IV x 1 given in ED, continue with 1500mg  IV q48h for estimated AUC 484 - next dose due 11/24 > Follow renal function closely and adjust dose as needed Check vancomycin  levels at steady state, goal AUC 400-550 Follow up renal function, cultures as available, clinical progress, length of tx  Height: 5' 10 (177.8 cm) Weight: 81.6 kg (180 lb) IBW/kg (Calculated) : 73  Temp (24hrs), Avg:98.3 F (36.8 C), Min:97.9 F (36.6 C), Max:98.8 F (37.1 C)  Recent Labs  Lab 06/11/24 2129 06/12/24 0636 06/13/24 1022 06/14/24 0250 06/15/24 0236 06/16/24 0229 06/17/24 0413 06/17/24 0506 06/17/24 0700 06/17/24 1423  WBC  --  10.2  --  9.9 10.0 10.7* 27.2*  --   --   --   CREATININE  --  8.87* 3.66* 2.72* 1.99* 2.09* 2.26*  --   --   --   LATICACIDVEN 0.9  --   --   --   --   --   --  2.5* 2.4* 0.7    Estimated Creatinine Clearance: 26.5 mL/min (A) (by C-G formula based on SCr of 2.26 mg/dL (H)).    Allergies  Allergen Reactions   Crestor  [Rosuvastatin ] Other (See Comments)    Leg pain and cramping   Exforge [Amlodipine Besylate-Valsartan] Other (See Comments)    Myalgia    Simvastatin Other (See Comments)    Antimicrobials this admission: Vanc 11/22 >> Cefepime  11/22 >>  Dose adjustments this admission:  Microbiology results: 11/22 BCx: 11/22 UCx:  Thank you for allowing pharmacy to be a part of this patient's care.  Rocky Slade, PharmD, BCPS 06/17/2024 9:04 PM

## 2024-06-17 NOTE — Progress Notes (Signed)
 ED Pharmacy Antibiotic Sign Off An antibiotic consult was received from an ED provider for cefepime /vancomycin  per pharmacy dosing for Urosepsis. A chart review was completed to assess appropriateness.   -Recently discharged on 11/20 found to have ARF and UTI with hematuria (to complete 3 day course of keflex  at discharge- Ucx negative at last admission)   The following one time order(s) were placed:   -Cefepime  2g IV x1 -Vanc 2g IV x1  Further antibiotic and/or antibiotic pharmacy consults should be ordered by the admitting provider if indicated.   Thank you for allowing pharmacy to be a part of this patient's care.   Lynwood Poplar, [facility]  Clinical Pharmacist 06/17/24 5:14 AM

## 2024-06-18 ENCOUNTER — Inpatient Hospital Stay (HOSPITAL_COMMUNITY)

## 2024-06-18 DIAGNOSIS — I5032 Chronic diastolic (congestive) heart failure: Secondary | ICD-10-CM | POA: Diagnosis not present

## 2024-06-18 DIAGNOSIS — S0101XA Laceration without foreign body of scalp, initial encounter: Secondary | ICD-10-CM | POA: Diagnosis present

## 2024-06-18 DIAGNOSIS — R310 Gross hematuria: Secondary | ICD-10-CM | POA: Diagnosis not present

## 2024-06-18 DIAGNOSIS — E872 Acidosis, unspecified: Secondary | ICD-10-CM | POA: Diagnosis present

## 2024-06-18 DIAGNOSIS — I129 Hypertensive chronic kidney disease with stage 1 through stage 4 chronic kidney disease, or unspecified chronic kidney disease: Secondary | ICD-10-CM | POA: Diagnosis present

## 2024-06-18 DIAGNOSIS — N3091 Cystitis, unspecified with hematuria: Secondary | ICD-10-CM | POA: Diagnosis present

## 2024-06-18 DIAGNOSIS — D649 Anemia, unspecified: Secondary | ICD-10-CM | POA: Diagnosis not present

## 2024-06-18 DIAGNOSIS — W19XXXA Unspecified fall, initial encounter: Secondary | ICD-10-CM | POA: Diagnosis present

## 2024-06-18 DIAGNOSIS — L89312 Pressure ulcer of right buttock, stage 2: Secondary | ICD-10-CM | POA: Diagnosis present

## 2024-06-18 DIAGNOSIS — R319 Hematuria, unspecified: Secondary | ICD-10-CM | POA: Diagnosis not present

## 2024-06-18 DIAGNOSIS — I2510 Atherosclerotic heart disease of native coronary artery without angina pectoris: Secondary | ICD-10-CM | POA: Diagnosis not present

## 2024-06-18 DIAGNOSIS — Y846 Urinary catheterization as the cause of abnormal reaction of the patient, or of later complication, without mention of misadventure at the time of the procedure: Secondary | ICD-10-CM | POA: Diagnosis present

## 2024-06-18 DIAGNOSIS — Z7189 Other specified counseling: Secondary | ICD-10-CM | POA: Diagnosis not present

## 2024-06-18 DIAGNOSIS — N179 Acute kidney failure, unspecified: Secondary | ICD-10-CM | POA: Diagnosis not present

## 2024-06-18 DIAGNOSIS — Z7982 Long term (current) use of aspirin: Secondary | ICD-10-CM | POA: Diagnosis not present

## 2024-06-18 DIAGNOSIS — Z87440 Personal history of urinary (tract) infections: Secondary | ICD-10-CM | POA: Diagnosis not present

## 2024-06-18 DIAGNOSIS — Z8249 Family history of ischemic heart disease and other diseases of the circulatory system: Secondary | ICD-10-CM | POA: Diagnosis not present

## 2024-06-18 DIAGNOSIS — Z66 Do not resuscitate: Secondary | ICD-10-CM | POA: Diagnosis present

## 2024-06-18 DIAGNOSIS — T83518A Infection and inflammatory reaction due to other urinary catheter, initial encounter: Secondary | ICD-10-CM | POA: Diagnosis present

## 2024-06-18 DIAGNOSIS — N289 Disorder of kidney and ureter, unspecified: Secondary | ICD-10-CM | POA: Diagnosis not present

## 2024-06-18 DIAGNOSIS — I110 Hypertensive heart disease with heart failure: Secondary | ICD-10-CM | POA: Diagnosis not present

## 2024-06-18 DIAGNOSIS — N1831 Chronic kidney disease, stage 3a: Secondary | ICD-10-CM | POA: Diagnosis present

## 2024-06-18 DIAGNOSIS — D62 Acute posthemorrhagic anemia: Secondary | ICD-10-CM | POA: Diagnosis present

## 2024-06-18 DIAGNOSIS — R338 Other retention of urine: Secondary | ICD-10-CM | POA: Diagnosis present

## 2024-06-18 DIAGNOSIS — Z515 Encounter for palliative care: Secondary | ICD-10-CM | POA: Diagnosis not present

## 2024-06-18 DIAGNOSIS — Z79899 Other long term (current) drug therapy: Secondary | ICD-10-CM | POA: Diagnosis not present

## 2024-06-18 DIAGNOSIS — D6959 Other secondary thrombocytopenia: Secondary | ICD-10-CM | POA: Diagnosis present

## 2024-06-18 DIAGNOSIS — Z23 Encounter for immunization: Secondary | ICD-10-CM | POA: Diagnosis present

## 2024-06-18 DIAGNOSIS — E782 Mixed hyperlipidemia: Secondary | ICD-10-CM | POA: Diagnosis present

## 2024-06-18 DIAGNOSIS — Z952 Presence of prosthetic heart valve: Secondary | ICD-10-CM | POA: Diagnosis not present

## 2024-06-18 DIAGNOSIS — D72829 Elevated white blood cell count, unspecified: Secondary | ICD-10-CM | POA: Diagnosis not present

## 2024-06-18 DIAGNOSIS — I951 Orthostatic hypotension: Secondary | ICD-10-CM | POA: Diagnosis present

## 2024-06-18 DIAGNOSIS — R296 Repeated falls: Secondary | ICD-10-CM | POA: Diagnosis present

## 2024-06-18 DIAGNOSIS — E8809 Other disorders of plasma-protein metabolism, not elsewhere classified: Secondary | ICD-10-CM | POA: Diagnosis present

## 2024-06-18 DIAGNOSIS — N401 Enlarged prostate with lower urinary tract symptoms: Secondary | ICD-10-CM | POA: Diagnosis not present

## 2024-06-18 DIAGNOSIS — E876 Hypokalemia: Secondary | ICD-10-CM | POA: Diagnosis present

## 2024-06-18 LAB — CBC
HCT: 20.5 % — ABNORMAL LOW (ref 39.0–52.0)
Hemoglobin: 6.6 g/dL — CL (ref 13.0–17.0)
MCH: 30.6 pg (ref 26.0–34.0)
MCHC: 32.2 g/dL (ref 30.0–36.0)
MCV: 94.9 fL (ref 80.0–100.0)
Platelets: 131 K/uL — ABNORMAL LOW (ref 150–400)
RBC: 2.16 MIL/uL — ABNORMAL LOW (ref 4.22–5.81)
RDW: 15.8 % — ABNORMAL HIGH (ref 11.5–15.5)
WBC: 21.7 K/uL — ABNORMAL HIGH (ref 4.0–10.5)
nRBC: 0 % (ref 0.0–0.2)

## 2024-06-18 LAB — BASIC METABOLIC PANEL WITH GFR
Anion gap: 13 (ref 5–15)
BUN: 40 mg/dL — ABNORMAL HIGH (ref 8–23)
CO2: 15 mmol/L — ABNORMAL LOW (ref 22–32)
Calcium: 7.8 mg/dL — ABNORMAL LOW (ref 8.9–10.3)
Chloride: 112 mmol/L — ABNORMAL HIGH (ref 98–111)
Creatinine, Ser: 2.42 mg/dL — ABNORMAL HIGH (ref 0.61–1.24)
GFR, Estimated: 26 mL/min — ABNORMAL LOW (ref >60)
Glucose, Bld: 110 mg/dL — ABNORMAL HIGH (ref 70–99)
Potassium: 3.8 mmol/L (ref 3.5–5.1)
Sodium: 140 mmol/L (ref 135–145)

## 2024-06-18 LAB — TYPE AND SCREEN
ABO/RH(D): A POS
Antibody Screen: NEGATIVE
Unit division: 0

## 2024-06-18 LAB — BPAM RBC
Blood Product Expiration Date: 202512122359
Unit Type and Rh: 6200

## 2024-06-18 LAB — HEMOGLOBIN AND HEMATOCRIT, BLOOD
HCT: 19 % — ABNORMAL LOW (ref 39.0–52.0)
HCT: 24 % — ABNORMAL LOW (ref 39.0–52.0)
Hemoglobin: 6.1 g/dL — CL (ref 13.0–17.0)
Hemoglobin: 7.8 g/dL — ABNORMAL LOW (ref 13.0–17.0)

## 2024-06-18 LAB — PREPARE RBC (CROSSMATCH)

## 2024-06-18 MED ORDER — HYDROCODONE-ACETAMINOPHEN 5-325 MG PO TABS
1.0000 | ORAL_TABLET | ORAL | Status: DC | PRN
  Administered 2024-06-18 – 2024-06-24 (×8): 1 via ORAL
  Filled 2024-06-18 (×8): qty 1

## 2024-06-18 MED ORDER — ATROPINE SULFATE 1 MG/10ML IJ SOSY
PREFILLED_SYRINGE | INTRAMUSCULAR | Status: AC
  Filled 2024-06-18: qty 10

## 2024-06-18 MED ORDER — ATROPINE SULFATE 1 MG/10ML IJ SOSY
PREFILLED_SYRINGE | INTRAMUSCULAR | Status: AC
Start: 2024-06-18 — End: 2024-06-18
  Filled 2024-06-18: qty 10

## 2024-06-18 MED ORDER — SODIUM CHLORIDE 0.9% IV SOLUTION: Freq: Once | INTRAVENOUS | Status: AC

## 2024-06-18 MED ORDER — SODIUM CHLORIDE 0.9 % IV BOLUS
500.0000 mL | Freq: Once | INTRAVENOUS | Status: AC
  Administered 2024-06-18: 500 mL via INTRAVENOUS

## 2024-06-18 MED ORDER — BUTALBITAL-APAP-CAFFEINE 50-325-40 MG PO TABS
1.0000 | ORAL_TABLET | Freq: Once | ORAL | Status: AC | PRN
  Administered 2024-06-18: 1 via ORAL
  Filled 2024-06-18: qty 1

## 2024-06-18 NOTE — Consult Note (Signed)
 Urology Consult   Physician requesting consult: Elgin Lam, MD   Reason for consult: Gross hematuria   History of Present Illness: Drew Adams is a 83 y.o. male with PMH BPH and recent admission for urinary retention (11/16-11/20) who was re-admitted 11/22 after a fall resulting in a scalp laceration. Hgb on arrival was 10.1 and frank hematuria was noted. Foley catheter was exchanged with worsening of hematuria.   Hgb this morning 6.1. Urology was consulted given hematuria requiring transfusion. On exam, 16 Fr 2-way catheter was unable to be irrigated and was found to be inflated in the prostatic urethra. This was exchanged for a 22 Fr 3-way hematuria catheter. 1.5 L of soft clot was hand irrigated from patient's bladder. CBI was started and urine was clear pink on moderate drip.   Past Medical History:  Diagnosis Date   Aortic stenosis    s/p TAVR on 05/30/24   Aortic valve disease    BPH (benign prostatic hyperplasia)    Diverticulosis of colon without hemorrhage 11/28/2012   ED (erectile dysfunction)    Elevated PSA    Gout    uses prednisone  1-2x/week   Hypertension    Iron deficiency anemia 11/28/2012   Mitral valve disease    Mixed hyperlipidemia    Ocular migraine    Paraesophageal hiatal hernia 11/28/2012   Radiculopathy    Thoracic aortic aneurysm (TAA)     Past Surgical History:  Procedure Laterality Date   CATARACT EXTRACTION Right 2012   CATARACT EXTRACTION Left Jan 2014   CORONARY ANGIOGRAPHY N/A 05/03/2024   Procedure: CORONARY ANGIOGRAPHY;  Surgeon: Verlin Lonni BIRCH, MD;  Location: [facility];  Service: Cardiovascular;  Laterality: N/A;   HERNIA REPAIR Right 1970s   open RIH repair   INTRAOPERATIVE TRANSTHORACIC ECHOCARDIOGRAM N/A 05/30/2024   Procedure: ECHOCARDIOGRAM, TRANSTHORACIC;  Surgeon: Wendel Lurena POUR, MD;  Location: [facility];  Service: Cardiovascular;  Laterality: N/A;   LAPAROSCOPIC PARTIAL COLECTOMY N/A 12/27/2012    Procedure: LAPAROSCOPIC  PARTIAL COLECTOMY RIGID PROCTOSCOPY ;  Surgeon: Elspeth KYM Schultze, MD;  Location: [facility];  Service: General;  Laterality: N/A;   LAPAROSCOPIC SIGMOID COLECTOMY  12/27/2012   TONSILLECTOMY AND ADENOIDECTOMY      Current Hospital Medications:  Home Meds:  No current facility-administered medications on file prior to encounter.   Current Outpatient Medications on File Prior to Encounter  Medication Sig Dispense Refill   cephALEXin  (KEFLEX ) 500 MG capsule Take 1 capsule (500 mg total) by mouth 2 (two) times daily for 3 days. 6 capsule 0   allopurinol  (ZYLOPRIM ) 300 MG tablet Take 300 mg by mouth daily.     Ascorbic Acid  (VITAMIN C ) 1000 MG tablet Take 1,000 mg by mouth daily.     aspirin  81 MG chewable tablet Chew 1 tablet (81 mg total) by mouth daily.     calcium  citrate (CALCITRATE - DOSED IN MG ELEMENTAL CALCIUM ) 950 MG tablet Take 600 mg by mouth daily.     cyanocobalamin  (VITAMIN B12) 1000 MCG tablet Take 1,000 mcg by mouth daily.     finasteride  (PROSCAR ) 5 MG tablet Take 5 mg by mouth daily.     hydrocortisone cream 1 % Apply 1 Application topically daily.     MAGNESIUM  GLYCINATE PO Take 400 mg by mouth daily.     meclizine  (ANTIVERT ) 25 MG tablet Take 1 tablet (25 mg total) by mouth 3 (three) times daily as needed for dizziness. 30 tablet 0   Multiple Vitamin (MULTIVITAMIN WITH  MINERALS) TABS Take 1 tablet by mouth daily. Mature     OVER THE COUNTER MEDICATION Take 1 tablet by mouth daily. prostacor     oxymetazoline  (AFRIN) 0.05 % nasal spray Place 1 spray into both nostrils 2 (two) times daily. 30 mL 0   pantoprazole  (PROTONIX ) 40 MG tablet Take 1 tablet (40 mg total) by mouth daily. 30 tablet 0   predniSONE  (DELTASONE ) 5 MG tablet Take 5 mg by mouth daily as needed (Gout).     psyllium (METAMUCIL) 58.6 % packet Take 1 packet by mouth daily. 10 ml     PURE L-ARGININE HCL PO Take 1,800 mg by mouth daily.     sodium chloride  (OCEAN) 0.65 % SOLN nasal spray Place  1 spray into both nostrils 4 (four) times daily. 50 mL 0   tamsulosin  (FLOMAX ) 0.4 MG CAPS capsule Take 1 capsule (0.4 mg total) by mouth daily. 30 capsule 0   Zinc  Gluconate 15 MG TABS Take 50 mg by mouth daily. Plus copper 2 mg       Scheduled Meds:  sodium chloride    Intravenous Once   allopurinol   300 mg Oral Daily   atropine        Chlorhexidine  Gluconate Cloth  6 each Topical Daily   finasteride   5 mg Oral Daily   pantoprazole   40 mg Oral Daily   sodium chloride  flush  3 mL Intravenous Q12H   tamsulosin   0.4 mg Oral Daily   Continuous Infusions:  sodium chloride  50 mL/hr at 06/18/24 0543   ceFEPime  (MAXIPIME ) IV 2 g (06/18/24 0545)   [START ON 06/19/2024] vancomycin      PRN Meds:.acetaminophen  **OR** acetaminophen , albuterol , atropine , HYDROcodone -acetaminophen , meclizine , ondansetron  **OR** ondansetron  (ZOFRAN ) IV  Allergies:  Allergies  Allergen Reactions   Crestor  [Rosuvastatin ] Other (See Comments)    Leg pain and cramping   Exforge [Amlodipine Besylate-Valsartan] Other (See Comments)    Myalgia    Simvastatin Other (See Comments)    Family History  Problem Relation Age of Onset   Heart attack Mother    CAD Mother    Parkinson's disease Father    Diabetes Father    Cancer Sister    Diabetes Sister     Social History:  reports that he has never smoked. He has never used smokeless tobacco. He reports that he does not drink alcohol and does not use drugs.  ROS: A complete review of systems was performed.  All systems are negative except for pertinent findings as noted.  Physical Exam:  Vital signs in last 24 hours: Temp:  [96.4 F (35.8 C)-98.8 F (37.1 C)] 98.1 F (36.7 C) (11/23 0750) Pulse Rate:  [33-95] 85 (11/23 0750) Resp:  [15-19] 16 (11/23 0750) BP: (75-135)/(37-71) 111/54 (11/23 0750) SpO2:  [86 %-100 %] 98 % (11/23 0750) Constitutional:  Alert and oriented, No acute distress Head: Scalp laceration repaired Cardiovascular: Regular rate and  rhythm, No JVD Respiratory: Normal respiratory effort, Lungs clear bilaterally GI: Abdomen is soft, nontender, nondistended, no abdominal masses GU: No CVA tenderness. After irrigation, foley draining clear pink urine on moderate drip CBI  Lymphatic: No lymphadenopathy Neurologic: Grossly intact, no focal deficits Psychiatric: Normal mood and affect  Laboratory Data:  Recent Labs    06/16/24 0229 06/17/24 0413 06/18/24 0546 06/18/24 0740  WBC 10.7* 27.2* 21.7*  --   HGB 8.6* 10.1* 6.6* 6.1*  HCT 26.4* 32.3* 20.5* 19.0*  PLT 165 218 131*  --     Recent Labs    06/16/24  9770 06/17/24 0413 06/18/24 0546  NA 139 140 140  K 4.0 4.8 3.8  CL 107 106 112*  GLUCOSE 98 132* 110*  BUN 37* 38* 40*  CALCIUM  8.1* 8.7* 7.8*  CREATININE 2.09* 2.26* 2.42*     Results for orders placed or performed during the hospital encounter of 06/17/24 (from the past 24 hours)  Lactic acid, plasma     Status: None   Collection Time: 06/17/24  2:23 PM  Result Value Ref Range   Lactic Acid, Venous 0.7 0.5 - 1.9 mmol/L  C-reactive protein     Status: Abnormal   Collection Time: 06/17/24  4:31 PM  Result Value Ref Range   CRP 5.7 (H) <1.0 mg/dL  CBC     Status: Abnormal   Collection Time: 06/18/24  5:46 AM  Result Value Ref Range   WBC 21.7 (H) 4.0 - 10.5 K/uL   RBC 2.16 (L) 4.22 - 5.81 MIL/uL   Hemoglobin 6.6 (LL) 13.0 - 17.0 g/dL   HCT 79.4 (L) 60.9 - 47.9 %   MCV 94.9 80.0 - 100.0 fL   MCH 30.6 26.0 - 34.0 pg   MCHC 32.2 30.0 - 36.0 g/dL   RDW 84.1 (H) 88.4 - 84.4 %   Platelets 131 (L) 150 - 400 K/uL   nRBC 0.0 0.0 - 0.2 %  Basic metabolic panel     Status: Abnormal   Collection Time: 06/18/24  5:46 AM  Result Value Ref Range   Sodium 140 135 - 145 mmol/L   Potassium 3.8 3.5 - 5.1 mmol/L   Chloride 112 (H) 98 - 111 mmol/L   CO2 15 (L) 22 - 32 mmol/L   Glucose, Bld 110 (H) 70 - 99 mg/dL   BUN 40 (H) 8 - 23 mg/dL   Creatinine, Ser 7.57 (H) 0.61 - 1.24 mg/dL   Calcium  7.8 (L) 8.9 -  10.3 mg/dL   GFR, Estimated 26 (L) >60 mL/min   Anion gap 13 5 - 15  Prepare RBC (crossmatch)     Status: None   Collection Time: 06/18/24  7:30 AM  Result Value Ref Range   Order Confirmation      ORDER PROCESSED BY BLOOD BANK Performed at [facility], [address] [place], [place] [address]   Hemoglobin and hematocrit, blood     Status: Abnormal   Collection Time: 06/18/24  7:40 AM  Result Value Ref Range   Hemoglobin 6.1 (LL) 13.0 - 17.0 g/dL   HCT 80.9 (L) 60.9 - 47.9 %  Type and screen  [facility]     Status: None (Preliminary result)   Collection Time: 06/18/24 12:18 PM  Result Value Ref Range   ABO/RH(D) PENDING    Antibody Screen PENDING    Sample Expiration      06/21/2024,2359 Performed at [facility], [address], [place], [place] [address]    Recent Results (from the past 240 hours)  Urine Culture     Status: None   Collection Time: 06/11/24  5:23 PM   Specimen: Urine, Clean Catch  Result Value Ref Range Status   Specimen Description URINE, CLEAN CATCH  Final   Special Requests NONE  Final   Culture   Final    NO GROWTH Performed at [facility], [address] [place], [place] [address]    Report Status 06/12/2024 FINAL  Final  Blood Culture (routine x 2)     Status: None   Collection  Time: 06/11/24  8:22 PM   Specimen: BLOOD  Result Value Ref Range Status   Specimen Description BLOOD BLOOD RIGHT HAND  Final   Special Requests   Final    BOTTLES DRAWN AEROBIC AND ANAEROBIC Blood Culture adequate volume   Culture   Final    NO GROWTH 5 DAYS Performed at [facility], [address] [place], [place] [address]    Report Status 06/16/2024 FINAL  Final  Urine Culture     Status: None   Collection Time: 06/11/24  8:23 PM   Specimen: Urine, Clean Catch  Result Value Ref Range Status   Specimen Description URINE, CLEAN CATCH  Final   Special Requests NONE  Final   Culture   Final    NO GROWTH Performed at  [facility], [address], [place], [place] [address]    Report Status 06/13/2024 FINAL  Final  Blood Culture (routine x 2)     Status: None   Collection Time: 06/11/24  8:27 PM   Specimen: BLOOD  Result Value Ref Range Status   Specimen Description BLOOD BLOOD RIGHT HAND  Final   Special Requests   Final    BOTTLES DRAWN AEROBIC AND ANAEROBIC Blood Culture results may not be optimal due to an inadequate volume of blood received in culture bottles   Culture   Final    NO GROWTH 5 DAYS Performed at [facility], [address] [place], [place] [address]    Report Status 06/16/2024 FINAL  Final  Resp panel by RT-PCR (RSV, Flu A&B, Covid) Anterior Nasal Swab     Status: None   Collection Time: 06/12/24  8:38 AM   Specimen: Anterior Nasal Swab  Result Value Ref Range Status   SARS Coronavirus 2 by RT PCR NEGATIVE NEGATIVE Final   Influenza A by PCR NEGATIVE NEGATIVE Final   Influenza B by PCR NEGATIVE NEGATIVE Final    Comment: (NOTE) The Xpert Xpress SARS-CoV-2/FLU/RSV plus assay is intended as an aid in the diagnosis of influenza from Nasopharyngeal swab specimens and should not be used as a sole basis for treatment. Nasal washings and aspirates are unacceptable for Xpert Xpress SARS-CoV-2/FLU/RSV testing.  Fact Sheet for Patients: bloggercourse.com  Fact Sheet for Healthcare Providers: seriousbroker.it  This test is not yet approved or cleared by the [place]  FDA and has been authorized for detection and/or diagnosis of SARS-CoV-2 by FDA under an Emergency Use Authorization (EUA). This EUA will remain in effect (meaning this test can be used) for the duration of the COVID-19 declaration under Section 564(b)(1) of the Act, 21 U.S.C. section 360bbb-3(b)(1), unless the authorization is terminated or revoked.     Resp Syncytial Virus by PCR NEGATIVE NEGATIVE Final    Comment: (NOTE) Fact Sheet for  Patients: bloggercourse.com  Fact Sheet for Healthcare Providers: seriousbroker.it  This test is not yet approved or cleared by the [place]  FDA and has been authorized for detection and/or diagnosis of SARS-CoV-2 by FDA under an Emergency Use Authorization (EUA). This EUA will remain in effect (meaning this test can be used) for the duration of the COVID-19 declaration under Section 564(b)(1) of the Act, 21 U.S.C. section 360bbb-3(b)(1), unless the authorization is terminated or revoked.  Performed at [facility], [address] [place], [place] [address]   Blood culture (routine x 2)     Status: None (Preliminary result)   Collection Time: 06/17/24  4:53 AM   Specimen: BLOOD  RIGHT HAND  Result Value Ref Range Status   Specimen Description BLOOD RIGHT HAND  Final   Special Requests   Final    BOTTLES DRAWN AEROBIC ONLY Blood Culture results may not be optimal due to an inadequate volume of blood received in culture bottles   Culture   Final    NO GROWTH < 12 HOURS Performed at [facility], [address], [place], [place] [address]    Report Status PENDING  Incomplete  Blood culture (routine x 2)     Status: None (Preliminary result)   Collection Time: 06/17/24  6:15 AM   Specimen: BLOOD LEFT ARM  Result Value Ref Range Status   Specimen Description BLOOD LEFT ARM  Final   Special Requests   Final    BOTTLES DRAWN AEROBIC AND ANAEROBIC Blood Culture results may not be optimal due to an inadequate volume of blood received in culture bottles   Culture   Final    NO GROWTH <12 HOURS Performed at [facility], [address], [place], [place] [address]    Report Status PENDING  Incomplete    Renal Function: Recent Labs    06/12/24 0636 06/13/24 1022 06/14/24 0250 06/15/24 0236 06/16/24 0229 06/17/24 0413 06/18/24 0546  CREATININE 8.87* 3.66* 2.72* 1.99* 2.09* 2.26* 2.42*   Estimated  Creatinine Clearance: 24.7 mL/min (A) (by C-G formula based on SCr of 2.42 mg/dL (H)).  Radiologic Imaging: CT Cervical Spine Wo Contrast Result Date: 06/17/2024 EXAM: CT CERVICAL SPINE WITHOUT CONTRAST 06/17/2024 04:04:20 AM TECHNIQUE: CT of the cervical spine was performed without the administration of intravenous contrast. Multiplanar reformatted images are provided for review. Automated exposure control, iterative reconstruction, and/or weight based adjustment of the mA/kV was utilized to reduce the radiation dose to as low as reasonably achievable. COMPARISON: Head CT 06/17/2024, reported separately. CLINICAL HISTORY: 83 year old male with multiple recent falls. FINDINGS: CERVICAL SPINE: BONES AND ALIGNMENT: Straightening of cervical lordosis. No acute fracture or traumatic malalignment. Mineralization is heterogeneous but within normal limits for age. Previously intact visible upper thoracic levels. DEGENERATIVE CHANGES: Degenerative cervical ankylosis at the C4-C5 and C6-C7 levels. Bulky chronic craniocervical junction and anterior C1-C2 degeneration with osteophytosis and sclerosis there. Widespread chronic cervical facet arthropathy. SOFT TISSUES: No prevertebral soft tissue swelling. Mild for age calcified atherosclerosis of the cervical carotid arteries. VISUALIZED LUNG APICES: Trace layering pleural fluid with otherwise negative visible lung apices. IMPRESSION: 1. No acute traumatic injury identified in the cervical spine. 2. Cervical spine degeneration superimposed on Degenerative ankylosis of C4-C5 and C6-C7. 3. Trace layering pleural effusions. Electronically signed by: Helayne Hurst MD 06/17/2024 04:16 AM EST RP Workstation: [id number]   CT Head Wo Contrast Result Date: 06/17/2024 EXAM: CT HEAD WITHOUT CONTRAST 06/17/2024 04:04:20 AM TECHNIQUE: CT of the head was performed without the administration of intravenous contrast. Automated exposure control, iterative reconstruction, and/or weight  based adjustment of the mA/kV was utilized to reduce the radiation dose to as low as reasonably achievable. COMPARISON: Brain MRI 10/14/2018. Head CT 09/02/2018. CLINICAL HISTORY: 83 year old male with ataxia, head trauma, recent falls, and scalp lacerations. FINDINGS: BRAIN AND VENTRICLES: No acute hemorrhage. No evidence of acute infarct. No hydrocephalus. No extra-axial collection. No mass effect or midline shift. Brain volume and chronic subarachnoid CSF in the setting of the volume loss have not significantly changed. Chronic calcified left MCA branch atherosclerosis. Small area of chronic appearing cortical encephalomalacia at the posterior left frontal operculum is new since 2020. Small superior  left perirolandic cortex encephalomalacia on previous MRI redemonstrated on series 3 image 9. Elsewhere gray white differentiation is within normal limits for age. No cerebral edema identified. No suspicious intracranial vascular hyperdensity. Progressed chronic small to medium sized vessel ischemia in the left MCA territory since 2020. ORBITS: No acute abnormality. SINUSES: Paranasal sinuses, middle ears and mastoids remain well aerated. SOFT TISSUES AND SKULL: Scalp lacerations. No skull fracture. Chronic craniocervical junction degeneration. Calcified atherosclerosis at the skull base. IMPRESSION: 1. No acute traumatic injury identified. 2. No acute intracranial abnormality. Progressed chronic small to medium sized vessel ischemia in the left MCA territory since 2020. Electronically signed by: Helayne Hurst MD 06/17/2024 04:13 AM EST RP Workstation: [id number]   DG Pelvis Portable Result Date: 06/17/2024 EXAM: 1 VIEW(S) XRAY OF THE PELVIS 06/17/2024 03:54:21 AM COMPARISON: None available. CLINICAL HISTORY: fall x2 FINDINGS: BONES AND JOINTS: No acute fracture. No joint dislocation. Mild bilateral hip DJD. Degenerative changes in the lumbar spine. SOFT TISSUES: Right pelvic phleboliths. Femoral arterial  calcifications. IMPRESSION: 1. No acute osseous abnormality related to recent falls. Electronically signed by: Oneil Devonshire MD 06/17/2024 04:00 AM EST RP Workstation: [id number]   DG Chest Port 1 View Result Date: 06/17/2024 EXAM: 1 VIEW(S) XRAY OF THE CHEST 06/17/2024 03:52:22 AM COMPARISON: 05/26/2024 CLINICAL HISTORY: fall x2 FINDINGS: LUNGS AND PLEURA: No focal pulmonary opacity. No pleural effusion. No pneumothorax. HEART AND MEDIASTINUM: Tortuous thoracic aorta. Aneurysmal ascending thoracic aorta. TAVR noted. BONES AND SOFT TISSUES: Thoracic dextroscoliosis. IMPRESSION: 1. No acute cardiopulmonary process. Electronically signed by: Oneil Devonshire MD 06/17/2024 03:59 AM EST RP Workstation: [id number]    I independently reviewed the above imaging studies.  Impression/Recommendation Pt is a 83 yo male with PMH BPH and recent admission with urinary retention and AKI (Cr 13.7 from baseline 1.5) who re-presented 11/22 after a fall resulting in a scalp laceration. Pt had hematuria on admission, which has worsened in the past 24 hours (Hgb 10.1 --> 6.1).   He now has a 22 Fr 3-way hematuria catheter in place and is s/p irrigation of 1.5L soft clot from his bladder. He is not on CBI, with clear pink urine. Patient's initial hematuria was likely related to UTI (UA with large blood so not fully reliable but this may have been the cause of his fall) vs prostatic bleeding, which was worsened by catheter placement in his prostatic urethra. Would continue supportive measures and wean CBI at this time with plan for close outpatient follow-up.  - Continue CBI, titrate to light pink urine   - If catheter stops draining, clamp CBI inflow, detach catheter from bag, and flush catheter with 60 cc NS with catheter-tip syringe  - [facility] if unable to restore patency - Agree with empiric treatment for UTI/antimicrobial coverage after extensive irrigation - Continue Flomax  and Finasteride  - Urology will continue to  follow  Drew Adams 06/18/2024, 12:43 PM

## 2024-06-18 NOTE — Evaluation (Signed)
 Physical Therapy Evaluation Patient Details Name: Drew Adams MRN: [id number] DOB: [birth year] Today's Date: 06/18/2024  History of Present Illness  83 y.o. male admitted 06/17/24 after two falls at home. First fall was controlled by family and lowered to the floor without injury.  Had subsequent fall early this morning with head injury. Noted to be orthostatic with EMS. Pt sustained an S-shaped 4cm laceration to top of scalp, no active bleeding, secondary wounds to left parietal scalp. Foley in place, dried blood present around meatus and along foley tubing, some blood collected in foley bag. Rapid Response called 11/23 d/t hypotension (78/38), fluid bolus ordered. PMHx: aortic stenosis s/p TAVR (05/30/2024), CAD, CKD 3B, dementia, urinary retention, BPH, and prior TIA. Of note, recent admission 11/16-11/20 for AKI and urinary retention.   Clinical Impression  Pt admitted with above diagnosis. PTA, pt was independent with functional mobility without AD and modI with ADLs. He lives alone in a multi-level home with 8 STE. Pt currently with functional limitations due to the deficits listed below (see PT Problem List). He required CGA-minA x2 for bed mobility and CGA x2 for sit<>stand using RW. Pt is currently limited by generalized weakness, impaired balance, and decreased activity tolerance. Pt denied dizziness and lightheadedness despite being positive for orthostatic hypotension. Deferred further mobility when MAP <60. Pt will benefit from acute skilled PT to increase their independence and safety with mobility to allow discharge. Pt is currently functioning below baseline and would benefit from ongoing acute PT services to progress towards safe discharge and to facilitate return to PLOF. Anticipate patient to progress to home with HHPT services pending improvement of BP and orthostatics.   06/18/24 1500  Vital Signs  Patient Position (if appropriate) Orthostatic Vitals  Orthostatic Lying   BP- Lying  109/44  Pulse- Lying 84  Orthostatic Sitting  BP- Sitting 106/56  Pulse- Sitting 113  Orthostatic Standing at 0 minutes  BP- Standing at 0 minutes (!) 82/48  Pulse- Standing at 0 minutes 116  Orthostatic Standing at 3 minutes  BP- Standing at 3 minutes (!) 75/52  Pulse- Standing at 3 minutes 111    06/18/24 1617  Vital Signs  Patient Position (if appropriate) Orthostatic Vitals  Orthostatic Sitting  BP- Sitting 115/58  Pulse- Sitting 102  Orthostatic Standing at 0 minutes  BP- Standing at 0 minutes (!) 64/48  Pulse- Standing at 0 minutes 114        If plan is discharge home, recommend the following: A lot of help with walking and/or transfers;A lot of help with bathing/dressing/bathroom;Assistance with cooking/housework;Assist for transportation;Help with stairs or ramp for entrance   Can travel by private vehicle        Equipment Recommendations Rolling walker (2 wheels)  Recommendations for Other Services       Functional Status Assessment Patient has had a recent decline in their functional status and demonstrates the ability to make significant improvements in function in a reasonable and predictable amount of time.     Precautions / Restrictions Precautions Precautions: Fall Recall of Precautions/Restrictions: Impaired Precaution/Restrictions Comments: Watch BP Restrictions Weight Bearing Restrictions Per Provider Order: No      Mobility  Bed Mobility Overal bed mobility: Needs Assistance Bed Mobility: Supine to Sit, Sit to Supine     Supine to sit: Contact guard, Min assist, HOB elevated, Used rails, +2 for safety/equipment Sit to supine: Contact guard assist, Min assist, HOB elevated, +2 for safety/equipment   General bed mobility comments: Pt sat up on R  side of bed with increased time. The bed's knees appear to remain bent, despite bed beeping as if it is at the lowest position. MinA to help navigate the hump. Pt scooted fwd with BUE support. Assist  to manage lines/leads.    Transfers Overall transfer level: Needs assistance Equipment used: Rolling walker (2 wheels) Transfers: Sit to/from Stand Sit to Stand: Contact guard assist, +2 safety/equipment, From elevated surface, +2 physical assistance           General transfer comment: Pt stood from lowest bed height and slightly elevated bed height. Cued proper hand placement using RW. Powered up with CGA x2. Good eccentric control.    Ambulation/Gait               General Gait Details: Deferred d/t orthostatic hypotension with MAP <60.  Stairs            Wheelchair Mobility     Tilt Bed    Modified Rankin (Stroke Patients Only)       Balance Overall balance assessment: Needs assistance Sitting-balance support: Bilateral upper extremity supported, Feet supported Sitting balance-Leahy Scale: Fair Sitting balance - Comments: Pt sat EOB with close supervision. Postural control: Posterior lean Standing balance support: Bilateral upper extremity supported, During functional activity, Reliant on assistive device for balance Standing balance-Leahy Scale: Poor Standing balance comment: Pt dependent on RW and external support of therapist. He developed a posterior lean and would brace back of legs on hospital bed for support as he fatigued.                             Pertinent Vitals/Pain Pain Assessment Pain Assessment: 0-10 Pain Score: 2  Pain Location: B ankles Pain Descriptors / Indicators: Discomfort Pain Intervention(s): Monitored during session, Repositioned    Home Living Family/patient expects to be discharged to:: Private residence Living Arrangements: Alone Available Help at Discharge: Family;Available PRN/intermittently (Daughter is from [place], but able to coordinate staying with him.) Type of Home: House Home Access: Stairs to enter Entrance Stairs-Rails: Right;Left;Can reach both Entrance Stairs-Number of Steps: 3+5 Alternate  Level Stairs-Number of Steps: 6 Home Layout: Multi-level (tri-level) Home Equipment: Rollator (4 wheels) Additional Comments: Daughter reports HH services were set-up after recent hospital admission, but they hadn't made it out to complete initial assessment yet.    Prior Function Prior Level of Function : Driving;Independent/Modified Independent             Mobility Comments: Ambulates without AD. 2 falls leading to current admission. ADLs Comments: ModI with ADLs, takes increased time.     Extremity/Trunk Assessment   Upper Extremity Assessment Upper Extremity Assessment: Defer to OT evaluation    Lower Extremity Assessment Lower Extremity Assessment: Generalized weakness    Cervical / Trunk Assessment Cervical / Trunk Assessment: Kyphotic  Communication   Communication Communication: No apparent difficulties    Cognition Arousal: Alert Behavior During Therapy: WFL for tasks assessed/performed   PT - Cognitive impairments: History of cognitive impairments (Dementia)                       PT - Cognition Comments: Pt A,Ox4 Following commands: Intact       Cueing Cueing Techniques: Verbal cues     General Comments General comments (skin integrity, edema, etc.): Assessed Orthostatic Vitals, (+). Pt denied symptoms. Pt with scalp lac covered by dressing.    Exercises     Assessment/Plan  PT Assessment Patient needs continued PT services  PT Problem List Decreased strength;Decreased activity tolerance;Decreased balance;Decreased mobility;Cardiopulmonary status limiting activity       PT Treatment Interventions DME instruction;Gait training;Stair training;Functional mobility training;Therapeutic activities;Therapeutic exercise;Balance training;Neuromuscular re-education;Patient/family education    PT Goals (Current goals can be found in the Care Plan section)  Acute Rehab PT Goals Patient Stated Goal: Return Home stronger PT Goal Formulation: With  patient/family Time For Goal Achievement: 07/02/24 Potential to Achieve Goals: Good    Frequency Min 2X/week     Co-evaluation PT/OT/SLP Co-Evaluation/Treatment: Yes Reason for Co-Treatment: For patient/therapist safety;To address functional/ADL transfers PT goals addressed during session: Mobility/safety with mobility;Balance;Proper use of DME         AM-PAC PT 6 Clicks Mobility  Outcome Measure Help needed turning from your back to your side while in a flat bed without using bedrails?: A Little Help needed moving from lying on your back to sitting on the side of a flat bed without using bedrails?: A Little Help needed moving to and from a bed to a chair (including a wheelchair)?: A Lot Help needed standing up from a chair using your arms (e.g., wheelchair or bedside chair)?: A Lot Help needed to walk in hospital room?: Total Help needed climbing 3-5 steps with a railing? : Total 6 Click Score: 12    End of Session Equipment Utilized During Treatment: Gait belt Activity Tolerance: Treatment limited secondary to medical complications (Comment) (Orthostatic Hypotension) Patient left: in bed;with call bell/phone within reach;with bed alarm set;with family/visitor present Nurse Communication: Mobility status;Other (comment) (BP response,(+) OH, pt denied symptoms. Consideration for [facility], Abdominal Binder, and SCDs.) PT Visit Diagnosis: Unsteadiness on feet (R26.81);Other abnormalities of gait and mobility (R26.89);Muscle weakness (generalized) (M62.81)    Time: 8464-8383 PT Time Calculation (min) (ACUTE ONLY): 41 min   Charges:   PT Evaluation $PT Eval Moderate Complexity: 1 Mod PT Treatments $Therapeutic Activity: 8-22 mins PT General Charges $$ ACUTE PT VISIT: 1 Visit         Randall SAUNDERS, PT, DPT Acute Rehabilitation Services Office: [phone] Secure Chat Preferred  [name] 06/18/2024, 4:55 PM

## 2024-06-18 NOTE — Hospital Course (Addendum)
 Drew Adams is a 83 y.o. male with a history of aortic stenosis s/p TAVR, hyperlipidemia, CAD, CKD, BPH, urinary retention.  Patient presented secondary to a fall, suffering a scalp laceration with evidence of UTI and gross hematuria with resultant anemia requiring blood transfusion. Antibiotics started. Urology consulted. Hematuria catheter placed and CBI started however patient developed continued hematuria and recurrent obstruction from clots. Patient went for cystoscopy with clot evacuation on 11/24. CBI held on 11/26. Foley removed 11/27 however patient started retaining again. Initially declined reinsertion of foley  Patient was finally agreeable for Foley reinsertion and Foley was inserted on 11/29 and had 1800 mL out and had some bloody urine.  Hemoglobin was 6.5 this morning so we will type and screen and transfuse 2 units of PRBCs.  The urology team recommends to continue the Foley catheter drainage and recommending nursing irrigation and feels that he does not need CBI at this time.  His numbers are improved and he has been stabilized.  Urology has signed off the case and he is medically stable for discharge at this time with outpatient follow-up and will need palliative care continued goals of care discussion outpatient setting.  Given his stability he will be discharged with a Foley with outpatient trial of void based on urology recommendations.  Assessment and Plan:  Fall: Unclear etiology but could possibly related to orthostatic hypotension. Patient with evidence of hypotension overnight while admitted. Likely contributed to by acute anemia from gross hematuria. PT/OT initially recommending home health therapy on discharge but have now changed recommendations to SNF and agreeable for SNF and medically stable   Head Laceration: Secondary to fall. CT head/cervical spine unremarkable for acute process. Laceration sutured by EDP; 4 sutures placed on 11/22. Patient picked some of his sutures  off on 11/29. Will need to Remove sutures in 7-10 days from placement.   Leukocytosis: Patient did not meet SIRS criteria on admission. Associated lactic acidosis. Likely related to underlying urinary tract infection and possible associated dehydration. WBC improved and leukocytosis resolved as current WBC Trend:  Recent Labs  Lab 06/19/24 0811 06/20/24 0338 06/21/24 0307 06/22/24 0259 06/23/24 0439 06/25/24 0858 06/26/24 0601  WBC 12.7* 10.7* 13.1* 9.4 9.4 6.7 6.8   Hypotension: Likely related to acute anemia, rather than infectious etiology. Resolved with IV fluids however patient still has orthostatic hypotension. Repeat Orthostatic VS in the AM. CTM BP per Protocol. Last BP reading is now normalized to 122/61   Orthostatic Hypotension: Asymptomatic. Possibly related to anemia. Patient preferred to avoid further blood transfusions but given his Hgb <7 he was agreeable. -C/w TED Hose and Abdominal binder -Repeat Orthostatic VS closely and continue to monitor at SNF   Acute Urinary Retention BPH Patient with recent foley catheter placement for management. Catheter replaced this admission again. Urology initially okay with voiding trial and Foley removed on 11/27 but patient has recurrent urinary retention and was refusing catheter replacement.  Patient continues to hold greater than 1 L of urine. Multiple attempts from Dr. Briana and urology had failed to convince him but finally agreeable and replaced 11/29. Continue analgesics -Continue Tamsulosin  but increased from 0.4 mg po Daily to 0.8 mg p.o. daily per urology recommendations.  Continue Finasteride  5 mg po Daily -Palliative Care Consulted and see below   Gross Hematuria: Unclear etiology. Possibly related to cystitis versus trauma in setting of underlying BPH and foley placement. Complicated by home Aspirin , in addition to subcutaneous heparin  started this admission for VTE prophylaxis. Patient with associated  anemia requiring blood  transfusions (2 today) Foley catheter in place on admission. Urology consulted and converted patient to a hematuria catheter, starting CBI. Urine continued to show evidence of hematuria. Urology took patient to the OR for cystoscopy and clot evacuation on 11/24. Hematuria had resolved resolved and CBI discontinued. Foley catehter removed on 11/27 given Urology recommendations but patient continue to Retain so replaced 11/29.  Outpatient follow-up with urology in 1 to 2 weeks   Acute blood loss anemia: Secondary to gross hematuria. Hemoglobin of 10.1 on admission, although more recent baseline appears to be between 8-9. Patient has required 2 units of PRBC via transfusion to date and will receive another 2 units today. Hgb/Hct Trend:  Recent Labs  Lab 06/20/24 1832 06/21/24 0307 06/22/24 0259 06/23/24 0439 06/25/24 0858 06/25/24 2332 06/26/24 0601  HGB 8.6* 7.2* 7.0* 7.7* 6.5* 8.1* 8.7*  HCT 26.3* 22.1* 21.4* 23.8* 20.1* 24.7* 26.2*  MCV  --  92.9 93.0 94.1 95.3  --  92.3  -CTM for S/Sx of Bleeding; Hematuria is improving and Urine Color Lightening. Repeat CBC in the AM    History of Cystitis UTI Presumed hemorrhagic cystitis. Patient treated with antibiotics during prior hospitalization. Cefepime  started this admission. Urine culture obtained and is no growth. Antibiotics discontinued as he is afebrile now and because Leukocytosis has resolved.    AKI on CKD stage 3a / Metabolic Acidosis: Baseline creatinine appear to be around 1.3 from earlier this year, prior to recent urologic issues. Creatinine of 2.26 on admission and has trended up to a peak of 2.98. BUN/Cr Trend: Recent Labs  Lab 06/19/24 0811 06/20/24 0338 06/21/24 0307 06/22/24 0259 06/23/24 1849 06/25/24 0207 06/26/24 0601  BUN 44* 40* 32* 29* 34* 42* 38*  CREATININE 2.33* 1.83* 1.93* 1.80* 2.29* 2.98* 2.16*  -Concerned this is related to obstructive cause, as he had this issue on his most recent admission. Renal ultrasound  significant for evidence of no hydronephrosis (post-foley exchange). Urine output is complicated by CBI documentation. Creatinine had improved to 1.80 before recurrent urinary retention. Foley reinserted 11/29.  Intake/Output Summary (Last 24 hours) at 06/26/2024 1305 Last data filed at 06/26/2024 0900 Gross per 24 hour  Intake 1315.83 ml  Output 1550 ml  Net -234.17 ml  -Patient had a slight metabolic acidosis with a CO2 of 19, anion gap of 10, chloride level of 111 but this is now improved as CO2 is 23, anion gap is 7 and chloride level is 111 -Avoid Nephrotoxic Medications, Contrast Dyes, Hypotension and Dehydration to Ensure Adequate Renal Perfusion and will need to Renally Adjust Meds.  -Continue to Monitor and Trend Renal Function carefully and repeat CMP in the AM    Hypokalemia: Improved. K+ is now 3.6. CTM and Replete as Necessary. Repeat CMP in the AM   Cognitive communication deficit: Speech therapy evaluated and recommend outpatient speech therapy on discharge.   Thrombocytopenia: Likely related to acute blood loss. Improved. Plt Count Trend:  Recent Labs  Lab 06/19/24 [id number] 06/20/24 0338 06/21/24 0307 06/22/24 0259 06/23/24 0439 06/25/24 0858 06/26/24 0601  PLT 126* 114* 117* 119* 137* 133* 139*  -CTM for S/Sx of Bleeding; Repeat CBC within 1 week   Left renal cyst: Noted on imaging. Recommendation for follow-up ultrasound in 6 months.   History of TAVR: Noted.   CAD: Noted. No chest pain. Hold aspirin  pending urology recommendations; will check final recommendations prior to patient discharge   Goals of care: Patient reports that after his wife died, he  is not worried about continuing his life as it is. He states that he does not want to be resuscitated if he were to die and does not want to be intubated, should he need it. It may be beneficial for patient to at least complete a MOST form prior to discharge. Chaplain services for advanced directives. Palliative care  consultation and currently he is wanting the DNR limited and his goal of care is medical stabilization and recovery to the extent that is possible and would like to proceed with SNF placement for rehabilitation at this time  GERD/GI Prophylaxis: C/w PPI Pantoprazole  40 mg po Daily    Pressure injury: Present on admission. Right buttocks.  Wound 06/17/24 1711 Pressure Injury Buttocks Right Stage 2 -  Partial thickness loss of dermis presenting as a shallow open injury with a red, pink wound bed without slough. (Active)   Hypoalbuminemia: Patient's Albumin  Lvl Trend: Recent Labs  Lab 06/11/24 1720 06/12/24 0636 06/13/24 1022 06/17/24 0413 06/25/24 1044 06/26/24 0601  ALBUMIN  2.8* 2.4* 2.5* 2.9* 2.3* 2.2*  -CTM & Trend & repeat CMP in the AM

## 2024-06-18 NOTE — Evaluation (Signed)
 Occupational Therapy Evaluation Patient Details Name: Drew Adams MRN: [id number] DOB: 09/17/1940 Today's Date: 06/18/2024   History of Present Illness   83 y.o. male admitted 06/17/24 after two falls at home. First fall was controlled by family and lowered to the floor without injury.  Had subsequent fall early this morning with head injury. Noted to be orthostatic with EMS. Pt sustained an S-shaped 4cm laceration to top of scalp, no active bleeding, secondary wounds to left parietal scalp. Foley in place, dried blood present around meatus and along foley tubing, some blood collected in foley bag. Rapid Response called 11/23 d/t hypotension (78/38), fluid bolus ordered. PMHx: aortic stenosis s/p TAVR (05/30/2024), CAD, CKD 3B, dementia, urinary retention, BPH, and prior TIA. Of note, recent admission 11/16-11/20 for AKI and urinary retention.     Clinical Impressions Pt with recent hospitalization and was supposed to start home health evaluations for therapy today. His daughter is with him and providing short term supervision and assist from [place]. Today Pt presents positive for orthostatic vitals limiting pt in mobility and safety with transfers for ADL. He was transfusing blood during the session - and therapy team encouraged eating and drinking fluids. Pt is currently mod A for LB ADL, set up for seated UB ADL, and min A +2 for transfers due to BP. While he reports no symptoms Pt with clinical changes in balance, strength in standing. When he was last admitted pt scored 13/28 on the Short Blessed Test, indicating cognitive impairment, especially working and [employer]. OT will continue to follow acutely and suspect that once BP is better controlled he will be able to return home and re-initiate [facility] services. A concern is cognition with executive function/medicine management for the long term. OT will follow acutely. Next session plan to do OOB activity in addition to pill box cognitive assessment        06/18/24 1500  Vital Signs  Patient Position (if appropriate) Orthostatic Vitals  Orthostatic Lying   BP- Lying 109/44  Pulse- Lying 84  Orthostatic Sitting  BP- Sitting 106/56  Pulse- Sitting 113  Orthostatic Standing at 0 minutes  BP- Standing at 0 minutes (!) 82/48  Pulse- Standing at 0 minutes 116  Orthostatic Standing at 3 minutes  BP- Standing at 3 minutes (!) 75/52  Pulse- Standing at 3 minutes 111      06/18/24 1617  Vital Signs  Patient Position (if appropriate) Orthostatic Vitals  Orthostatic Sitting  BP- Sitting 115/58  Pulse- Sitting 102  Orthostatic Standing at 0 minutes  BP- Standing at 0 minutes (!) 64/48  Pulse- Standing at 0 minutes 114        If plan is discharge home, recommend the following:   A little help with walking and/or transfers;A little help with bathing/dressing/bathroom;Assistance with cooking/housework;Assist for transportation;Supervision due to cognitive status     Functional Status Assessment   Patient has had a recent decline in their functional status and demonstrates the ability to make significant improvements in function in a reasonable and predictable amount of time.     Equipment Recommendations   Other (comment);Tub/shower seat (RW)     Recommendations for Other Services   PT consult;Speech consult     Precautions/Restrictions   Precautions Precautions: Fall Recall of Precautions/Restrictions: Impaired Precaution/Restrictions Comments: Watch BP Restrictions Weight Bearing Restrictions Per Provider Order: No     Mobility Bed Mobility Overal bed mobility: Needs Assistance Bed Mobility: Supine to Sit, Sit to Supine     Supine to sit: Contact  guard, Min assist, HOB elevated, Used rails, +2 for safety/equipment Sit to supine: Contact guard assist, Min assist, HOB elevated, +2 for safety/equipment   General bed mobility comments: Pt sat up on R side of bed with increased time. The bed's knees appear to  remain bent, despite bed beeping as if it is at the lowest position. MinA to help navigate the hump. Pt scooted fwd with BUE support. Assist to manage lines/leads.    Transfers Overall transfer level: Needs assistance Equipment used: Rolling walker (2 wheels) Transfers: Sit to/from Stand Sit to Stand: Contact guard assist, +2 safety/equipment, From elevated surface, +2 physical assistance           General transfer comment: Pt stood from lowest bed height and slightly elevated bed height. Cued proper hand placement using RW. Powered up with CGA x2. Good eccentric control.      Balance Overall balance assessment: Needs assistance Sitting-balance support: Bilateral upper extremity supported, Feet supported Sitting balance-Leahy Scale: Fair Sitting balance - Comments: Pt sat EOB with close supervision. Postural control: Posterior lean Standing balance support: Bilateral upper extremity supported, During functional activity, Reliant on assistive device for balance Standing balance-Leahy Scale: Poor Standing balance comment: Pt dependent on RW and external support of therapist. He developed a posterior lean and would brace back of legs on hospital bed for support as he fatigued.                           ADL either performed or assessed with clinical judgement   ADL Overall ADL's : Needs assistance/impaired Eating/Feeding: Modified independent   Grooming: Set up;Sitting   Upper Body Bathing: Set up;Sitting   Lower Body Bathing: Contact guard assist;Sit to/from stand   Upper Body Dressing : Set up;Sitting   Lower Body Dressing: Contact guard assist;Sit to/from stand   Toilet Transfer: Ambulation;+2 for safety/equipment;Minimal assistance Toilet Transfer Details (indicate cue type and reason): simulated - but limited by BP (OH+) Toileting- Clothing Manipulation and Hygiene: Minimal assistance Toileting - Clothing Manipulation Details (indicate cue type and reason):  foley     Functional mobility during ADLs: Rolling walker (2 wheels);Minimal assistance;+2 for safety/equipment General ADL Comments: decreased safety awareness, orthostatic BP,     Vision Baseline Vision/History: 0 No visual deficits Vision Assessment?: No apparent visual deficits     Perception         Praxis         Pertinent Vitals/Pain Pain Assessment Pain Assessment: 0-10 Pain Score: 2  Pain Location: B ankles Pain Descriptors / Indicators: Discomfort Pain Intervention(s): Monitored during session, Repositioned     Extremity/Trunk Assessment Upper Extremity Assessment Upper Extremity Assessment: Generalized weakness (multiple abraisons)   Lower Extremity Assessment Lower Extremity Assessment: Defer to PT evaluation   Cervical / Trunk Assessment Cervical / Trunk Assessment: Kyphotic   Communication Communication Communication: No apparent difficulties   Cognition Arousal: Alert Behavior During Therapy: WFL for tasks assessed/performed Cognition: History of cognitive impairments             OT - Cognition Comments: during recent hospitalization he scored 13/28 on the Short Blessed Test, indicating cognitive impairment, especially working and [employer]                 Following commands: Intact       Cueing  General Comments   Cueing Techniques: Verbal cues  Assessed Orthostatic Vitals, (+). Pt denied symptoms. Pt with scalp lac covered by dressing.   Exercises  Shoulder Instructions      Home Living Family/patient expects to be discharged to:: Private residence Living Arrangements: Alone Available Help at Discharge: Family;Available PRN/intermittently (Daughter is from [place], but able to coordinate staying with him.) Type of Home: House Home Access: Stairs to enter [employer] of Steps: 3+5 Entrance Stairs-Rails: Right;Left;Can reach both Home Layout: Multi-level (tri-level) Alternate Level Stairs-Number of Steps:  6 Alternate Level Stairs-Rails: Right Bathroom Shower/Tub: Tub/shower unit;Door   [employer] Toilet: Handicapped height Bathroom Accessibility: Yes How Accessible: Accessible via walker Home Equipment: Rollator (4 wheels)   Additional Comments: Daughter reports HH services were set-up after recent hospital admission, but they hadn't made it out to complete initial assessment yet.      Prior Functioning/Environment Prior Level of Function : Driving;Independent/Modified Independent             Mobility Comments: Ambulates without AD. 2 falls leading to current admission. ADLs Comments: ModI with ADLs, takes increased time.    OT Problem List: Decreased strength;Decreased activity tolerance   OT Treatment/Interventions: Self-care/ADL training;Therapeutic exercise;Energy conservation;DME and/or AE instruction;Therapeutic activities;Patient/family education      OT Goals(Current goals can be found in the care plan section)   Acute Rehab OT Goals Patient Stated Goal: get stronger OT Goal Formulation: With patient/family Time For Goal Achievement: 07/02/24 Potential to Achieve Goals: Good   OT Frequency:  Min 2X/week    Co-evaluation   Reason for Co-Treatment: For patient/therapist safety;To address functional/ADL transfers PT goals addressed during session: Mobility/safety with mobility;Balance;Proper use of DME        AM-PAC OT 6 Clicks Daily Activity     Outcome Measure Help from another person eating meals?: None Help from another person taking care of personal grooming?: A Little Help from another person toileting, which includes using toliet, bedpan, or urinal?: A Little Help from another person bathing (including washing, rinsing, drying)?: A Little Help from another person to put on and taking off regular upper body clothing?: A Little Help from another person to put on and taking off regular lower body clothing?: A Little 6 Click Score: 19   End of Session  Equipment Utilized During Treatment: Gait belt;Rolling walker (2 wheels) Nurse Communication: Mobility status;Other (comment) (+Orthostatics)  Activity Tolerance: Patient tolerated treatment well;Treatment limited secondary to medical complications (Comment) (BP) Patient left: with call bell/phone within reach;with family/visitor present;in bed  OT Visit Diagnosis: Unsteadiness on feet (R26.81);Muscle weakness (generalized) (M62.81);Other symptoms and signs involving cognitive function                Time: 8463-8382 OT Time Calculation (min): 41 min Charges:  OT General Charges $OT Visit: 1 Visit OT Evaluation $OT Eval Moderate Complexity: 1 Mod  Leita DEL OTR/L Acute Rehabilitation Services Office: [phone]  Leita JINNY Odea 06/18/2024, 6:55 PM

## 2024-06-18 NOTE — Progress Notes (Addendum)
 PROGRESS NOTE    Drew Adams  FMW:[id number] DOB: 03/02/1941 DOA: 06/17/2024 PCP: Stacia Millman, PA   Brief Narrative: Drew Adams is a 83 y.o. male with a history of aortic stenosis s/p TAVR, hyperlipidemia, CAD, CKD, BPH, urinary retention.  Patient presented secondary to a fall, suffering a scalp laceration with evidence of UTI and gross hematuria with resultant anemia requiring blood transfusion. Antibiotics started. Urology consulted.    Assessment and Plan:  Fall Unclear etiology but could possibly related to orthostatic hypotension. Patient with evidence of hypotension overnight while admitted. Likely contributed to by acute anemia from gross hematuria.  -PT/OT eval  Head laceration Secondary to fall. CT head/cervical spine unremarkable for acute process. Laceration sutured by EDP; 4 sutures placed on 11/22. -Remove sutures in 7-10 days from placement.  Leukocytosis Patient did not meet SIRS criteria on admission. Associated lactic acidosis. Likely related to underlying urinary tract infection and possible associated dehydration. WBC trended down slightly today. -CBC daily  Hypotension Likely related to acute anemia, rather than infectious etiology. Resolved with IV fluids.  Acute urinary retention BPH Patient with recent foley catheter placement for management. Catheter replaced this admission.  -Continue foley catheter -Continue tamsulosin   Gross hematuria Unclear etiology. Possibly related to cystitis versus trauma in setting of underlying BPH and foley placement. Complicated by home Aspirin , in addition to subcutaneous heparin  started this admission for VTE prophylaxis. Patient with associated anemia requiring blood transfusion. Foley catheter in place. -Urology consultation  Acute blood loss anemia Secondary to gross hematuria. Hemoglobin of 10.1 on admission, although more recent baseline appears to be between 8-9. Hemoglobin down to 6.6, and then to 6.1  on repeat H&H. 1 unit of PRBC ordered. -Continue to transfuse 1 unit of PRBC -Post-transfusion H&H, but may need an extra 1-2 units of PRBC  History of cystitis UTI Presumed hemorrhagic cystitis. Patient treated with antibiotics during prior hospitalization. Cefepime  started this admission. Urine culture obtained. -Continue Cefepime  -Follow-up urine culture  AKI on CKD stage 3a Baseline creatinine appear to be around 1.3 from earlier this year, prior to recent urologic issues. Creatinine of 2.26 on admission and has trended up to 2.42. Concern this is related to obstructive cause, as he had this issue on his most recent admission. -Renal ultrasound -Trend BMP  History of TAVR Noted.  CAD Noted. No chest pain. -Hold aspirin  pending urology recommendations  Pressure injury Present on admission. Right buttocks.   DVT prophylaxis: SCDs Code Status:   Code Status: Full Code Family Communication: Son-in-law on telephone Disposition Plan: Discharge pending stable hemoglobin, urology recommendations, stable renal function   Consultants:  Urology  Procedures:  None  Antimicrobials: Cefepime     Subjective: Patient reports some lower abdominal discomfort. No other concerns.  Objective: BP (!) 135/41   Pulse 84   Temp (!) 96.4 F (35.8 C)   Resp 18   Ht 5' 10 (1.778 m)   Wt 81.6 kg   SpO2 100%   BMI 25.83 kg/m   Examination:  General exam: Appears calm and comfortable. Respiratory system: Clear to auscultation. Respiratory effort normal. Cardiovascular system: S1 & S2 heard, RRR. Gastrointestinal system: Lower abdomen is distended, soft and nontender. Normal bowel sounds heard. Central nervous system: Alert and oriented. Musculoskeletal: No edema. No calf tenderness Psychiatry: Judgement and insight appear normal. Mood & affect appropriate.    Data Reviewed: I have personally reviewed following labs and imaging studies  CBC Lab Results  Component Value  Date   WBC 21.7 (H)  06/18/2024   RBC 2.16 (L) 06/18/2024   HGB 6.6 (LL) 06/18/2024   HCT 20.5 (L) 06/18/2024   MCV 94.9 06/18/2024   MCH 30.6 06/18/2024   PLT 131 (L) 06/18/2024   MCHC 32.2 06/18/2024   RDW 15.8 (H) 06/18/2024   LYMPHSABS 1.1 06/17/2024   MONOABS 1.6 (H) 06/17/2024   EOSABS 0.1 06/17/2024   BASOSABS 0.1 06/17/2024     Last metabolic panel Lab Results  Component Value Date   NA 140 06/18/2024   K 3.8 06/18/2024   CL 112 (H) 06/18/2024   CO2 15 (L) 06/18/2024   BUN 40 (H) 06/18/2024   CREATININE 2.42 (H) 06/18/2024   GLUCOSE 110 (H) 06/18/2024   GFRNONAA 26 (L) 06/18/2024   GFRAA 65 (L) 12/30/2012   CALCIUM  7.8 (L) 06/18/2024   PHOS 3.6 06/14/2024   PROT 5.5 (L) 06/17/2024   ALBUMIN  2.9 (L) 06/17/2024   BILITOT 1.9 (H) 06/17/2024   ALKPHOS 57 06/17/2024   AST 18 06/17/2024   ALT 7 06/17/2024   ANIONGAP 13 06/18/2024    GFR: Estimated Creatinine Clearance: 24.7 mL/min (A) (by C-G formula based on SCr of 2.42 mg/dL (H)).  Recent Results (from the past 240 hours)  Urine Culture     Status: None   Collection Time: 06/11/24  5:23 PM   Specimen: Urine, Clean Catch  Result Value Ref Range Status   Specimen Description URINE, CLEAN CATCH  Final   Special Requests NONE  Final   Culture   Final    NO GROWTH Performed at [facility], [address], [place], [place] [address]    Report Status 06/12/2024 FINAL  Final  Blood Culture (routine x 2)     Status: None   Collection Time: 06/11/24  8:22 PM   Specimen: BLOOD  Result Value Ref Range Status   Specimen Description BLOOD BLOOD RIGHT HAND  Final   Special Requests   Final    BOTTLES DRAWN AEROBIC AND ANAEROBIC Blood Culture adequate volume   Culture   Final    NO GROWTH 5 DAYS Performed at [facility], [address] [place], [place] [address]    Report Status 06/16/2024 FINAL  Final  Urine Culture     Status: None   Collection Time: 06/11/24  8:23 PM   Specimen: Urine, Clean  Catch  Result Value Ref Range Status   Specimen Description URINE, CLEAN CATCH  Final   Special Requests NONE  Final   Culture   Final    NO GROWTH Performed at [facility], [address] [place], [place] [address]    Report Status 06/13/2024 FINAL  Final  Blood Culture (routine x 2)     Status: None   Collection Time: 06/11/24  8:27 PM   Specimen: BLOOD  Result Value Ref Range Status   Specimen Description BLOOD BLOOD RIGHT HAND  Final   Special Requests   Final    BOTTLES DRAWN AEROBIC AND ANAEROBIC Blood Culture results may not be optimal due to an inadequate volume of blood received in culture bottles   Culture   Final    NO GROWTH 5 DAYS Performed at [facility], [address] [place], [place] [address]    Report Status 06/16/2024 FINAL  Final  Resp panel by RT-PCR (RSV, Flu A&B, Covid) Anterior Nasal Swab     Status: None   Collection Time: 06/12/24  8:38 AM   Specimen: Anterior Nasal Swab  Result Value Ref Range Status   SARS Coronavirus 2 by RT PCR NEGATIVE NEGATIVE Final   Influenza A by PCR NEGATIVE NEGATIVE Final   Influenza B by PCR NEGATIVE NEGATIVE Final    Comment: (NOTE) The Xpert Xpress SARS-CoV-2/FLU/RSV plus assay is intended as an aid in the diagnosis of influenza from Nasopharyngeal swab specimens and should not be used as a sole basis for treatment. Nasal washings and aspirates are unacceptable for Xpert Xpress SARS-CoV-2/FLU/RSV testing.  Fact Sheet for Patients: bloggercourse.com  Fact Sheet for Healthcare Providers: seriousbroker.it  This test is not yet approved or cleared by the [place]  FDA and has been authorized for detection and/or diagnosis of SARS-CoV-2 by FDA under an Emergency Use Authorization (EUA). This EUA will remain in effect (meaning this test can be used) for the duration of the COVID-19 declaration under Section 564(b)(1) of the Act, 21 U.S.C. section  360bbb-3(b)(1), unless the authorization is terminated or revoked.     Resp Syncytial Virus by PCR NEGATIVE NEGATIVE Final    Comment: (NOTE) Fact Sheet for Patients: bloggercourse.com  Fact Sheet for Healthcare Providers: seriousbroker.it  This test is not yet approved or cleared by the [place]  FDA and has been authorized for detection and/or diagnosis of SARS-CoV-2 by FDA under an Emergency Use Authorization (EUA). This EUA will remain in effect (meaning this test can be used) for the duration of the COVID-19 declaration under Section 564(b)(1) of the Act, 21 U.S.C. section 360bbb-3(b)(1), unless the authorization is terminated or revoked.  Performed at [facility], [address] [place], [place] [address]   Blood culture (routine x 2)     Status: None (Preliminary result)   Collection Time: 06/17/24  4:53 AM   Specimen: BLOOD RIGHT HAND  Result Value Ref Range Status   Specimen Description BLOOD RIGHT HAND  Final   Special Requests   Final    BOTTLES DRAWN AEROBIC ONLY Blood Culture results may not be optimal due to an inadequate volume of blood received in culture bottles   Culture   Final    NO GROWTH < 12 HOURS Performed at [facility], [address], [place], [place] [address]    Report Status PENDING  Incomplete  Blood culture (routine x 2)     Status: None (Preliminary result)   Collection Time: 06/17/24  6:15 AM   Specimen: BLOOD LEFT ARM  Result Value Ref Range Status   Specimen Description BLOOD LEFT ARM  Final   Special Requests   Final    BOTTLES DRAWN AEROBIC AND ANAEROBIC Blood Culture results may not be optimal due to an inadequate volume of blood received in culture bottles   Culture   Final    NO GROWTH <12 HOURS Performed at [facility], [address], [place], [place] [address]    Report Status PENDING  Incomplete      Radiology Studies: CT Cervical Spine Wo  Contrast Result Date: 06/17/2024 EXAM: CT CERVICAL SPINE WITHOUT CONTRAST 06/17/2024 04:04:20 AM TECHNIQUE: CT of the cervical spine was performed without the administration of intravenous contrast. Multiplanar reformatted images are provided for review. Automated exposure control, iterative reconstruction, and/or weight based adjustment of the mA/kV was utilized to reduce the radiation dose to as low as reasonably achievable. COMPARISON: Head CT 06/17/2024, reported separately. CLINICAL HISTORY: 83 year old male with multiple recent falls. FINDINGS: CERVICAL SPINE: BONES AND ALIGNMENT: Straightening of cervical lordosis. No acute fracture or traumatic malalignment. Mineralization is  heterogeneous but within normal limits for age. Previously intact visible upper thoracic levels. DEGENERATIVE CHANGES: Degenerative cervical ankylosis at the C4-C5 and C6-C7 levels. Bulky chronic craniocervical junction and anterior C1-C2 degeneration with osteophytosis and sclerosis there. Widespread chronic cervical facet arthropathy. SOFT TISSUES: No prevertebral soft tissue swelling. Mild for age calcified atherosclerosis of the cervical carotid arteries. VISUALIZED LUNG APICES: Trace layering pleural fluid with otherwise negative visible lung apices. IMPRESSION: 1. No acute traumatic injury identified in the cervical spine. 2. Cervical spine degeneration superimposed on Degenerative ankylosis of C4-C5 and C6-C7. 3. Trace layering pleural effusions. Electronically signed by: Helayne Hurst MD 06/17/2024 04:16 AM EST RP Workstation: [id number]   CT Head Wo Contrast Result Date: 06/17/2024 EXAM: CT HEAD WITHOUT CONTRAST 06/17/2024 04:04:20 AM TECHNIQUE: CT of the head was performed without the administration of intravenous contrast. Automated exposure control, iterative reconstruction, and/or weight based adjustment of the mA/kV was utilized to reduce the radiation dose to as low as reasonably achievable. COMPARISON: Brain MRI  10/14/2018. Head CT 09/02/2018. CLINICAL HISTORY: 83 year old male with ataxia, head trauma, recent falls, and scalp lacerations. FINDINGS: BRAIN AND VENTRICLES: No acute hemorrhage. No evidence of acute infarct. No hydrocephalus. No extra-axial collection. No mass effect or midline shift. Brain volume and chronic subarachnoid CSF in the setting of the volume loss have not significantly changed. Chronic calcified left MCA branch atherosclerosis. Small area of chronic appearing cortical encephalomalacia at the posterior left frontal operculum is new since 2020. Small superior left perirolandic cortex encephalomalacia on previous MRI redemonstrated on series 3 image 9. Elsewhere gray white differentiation is within normal limits for age. No cerebral edema identified. No suspicious intracranial vascular hyperdensity. Progressed chronic small to medium sized vessel ischemia in the left MCA territory since 2020. ORBITS: No acute abnormality. SINUSES: Paranasal sinuses, middle ears and mastoids remain well aerated. SOFT TISSUES AND SKULL: Scalp lacerations. No skull fracture. Chronic craniocervical junction degeneration. Calcified atherosclerosis at the skull base. IMPRESSION: 1. No acute traumatic injury identified. 2. No acute intracranial abnormality. Progressed chronic small to medium sized vessel ischemia in the left MCA territory since 2020. Electronically signed by: Helayne Hurst MD 06/17/2024 04:13 AM EST RP Workstation: [id number]   DG Pelvis Portable Result Date: 06/17/2024 EXAM: 1 VIEW(S) XRAY OF THE PELVIS 06/17/2024 03:54:21 AM COMPARISON: None available. CLINICAL HISTORY: fall x2 FINDINGS: BONES AND JOINTS: No acute fracture. No joint dislocation. Mild bilateral hip DJD. Degenerative changes in the lumbar spine. SOFT TISSUES: Right pelvic phleboliths. Femoral arterial calcifications. IMPRESSION: 1. No acute osseous abnormality related to recent falls. Electronically signed by: Oneil Devonshire MD 06/17/2024  04:00 AM EST RP Workstation: [id number]   DG Chest Port 1 View Result Date: 06/17/2024 EXAM: 1 VIEW(S) XRAY OF THE CHEST 06/17/2024 03:52:22 AM COMPARISON: 05/26/2024 CLINICAL HISTORY: fall x2 FINDINGS: LUNGS AND PLEURA: No focal pulmonary opacity. No pleural effusion. No pneumothorax. HEART AND MEDIASTINUM: Tortuous thoracic aorta. Aneurysmal ascending thoracic aorta. TAVR noted. BONES AND SOFT TISSUES: Thoracic dextroscoliosis. IMPRESSION: 1. No acute cardiopulmonary process. Electronically signed by: Oneil Devonshire MD 06/17/2024 03:59 AM EST RP Workstation: [id number]      LOS: 0 days    Elgin Lam, MD [facility] 06/18/2024, 7:20 AM   If 7PM-7AM, please contact night-coverage [email or web address]

## 2024-06-18 NOTE — Progress Notes (Signed)
 OT Cancellation Note  Patient Details Name: Drew Adams MRN: [id number] DOB: 1940/09/15   Cancelled Treatment:    Reason Eval/Treat Not Completed: Patient not medically ready. Pt with critical HgB of 6.6, which is a 3.5 drop from yesterday. Pt pending 1 unit PRBCs. Will follow-up for OT evaluation after blood transfusion and as schedule permits.   Leita PARAS Mariela Rex 06/18/2024, 8:38 AM  Leita DEL OTR/L Acute Rehabilitation Services Office: [phone]

## 2024-06-18 NOTE — Plan of Care (Signed)

## 2024-06-18 NOTE — Progress Notes (Signed)
   06/18/24 1041  Urine Measurement/Characteristics  Bladder Scan Volume (mL) 344 mL

## 2024-06-18 NOTE — Significant Event (Signed)
 Rapid Response Event Note   Reason for Call :  Hypotension-70s, HA/generalized pain  Initial Focused Assessment:  Pt lying in bed with eyes open. He is alert and oriented, yelling that he is hurting. Pupils 4 and equal. Lungs clear t/o. ABD soft/ND. Skin warm/dry.   T-96.4, HR-70, BP-78/38, RR-20, SpO2-100% on 2L Portal.  Interventions:  500cc NS bolus Plan of Care:  BP better after bolus. Continue ot monitor pt closely. Please call RRT if further assistance needed.   Event Summary:   MD Notified: Daniels and Donati-Garmon, [facility] NPs notified. Dr. Sundil to bedside to assess.  Call Upfz:[phone] Arrival Time:0440 End Time:0510  Tish Graeme Piety, RN

## 2024-06-18 NOTE — Progress Notes (Signed)
       Overnight   NAME: Drew Adams MRN: [id number] DOB : 1940-11-12  Notification to remote coverage   Date of Service   06/18/2024   HPI/Events of Note    Notified by RN for concern of pain   Notified RR RN for assessment. Found Hypotension  Bolus ordered  Notified Onsite Physician at request of RR RN     Interventions/ Plan   Fluid bolus  On site Physician alerted  Blood transfusion ordered with lab result        Update 0654 hrs BP improved  Blood transfusion pending    Lynwood Kipper BSN MSNA MSN ACNPC-AG Acute Care Nurse Practitioner [facility]

## 2024-06-18 NOTE — Progress Notes (Signed)
 PT Cancellation Note  Patient Details Name: Drew Adams MRN: [id number] DOB: 29-Sep-1940   Cancelled Treatment:    Reason Eval/Treat Not Completed: Medical issues which prohibited therapy (PT consult appreciated and chart reviewed. Pt with critical HgB of 6.6, which is a 3.5 drop from yesterday. Pt pending 1 unit PRBCs. Will follow-up for PT evaluation after blood transfusion and as schedule permits.)  Randall SAUNDERS, PT, DPT Acute Rehabilitation Services Office: [phone] Secure Chat Preferred  [name] 06/18/2024, 7:05 AM

## 2024-06-19 ENCOUNTER — Inpatient Hospital Stay (HOSPITAL_COMMUNITY)

## 2024-06-19 ENCOUNTER — Inpatient Hospital Stay (HOSPITAL_COMMUNITY): Admitting: Anesthesiology

## 2024-06-19 ENCOUNTER — Encounter (HOSPITAL_COMMUNITY)
Admission: EM | Disposition: A | Discharge status: Skilled Nursing Facility | Payer: Self-pay | Source: Home / Self Care | Attending: Family Medicine

## 2024-06-19 ENCOUNTER — Encounter (HOSPITAL_COMMUNITY): Payer: Self-pay | Admitting: Internal Medicine

## 2024-06-19 DIAGNOSIS — R296 Repeated falls: Secondary | ICD-10-CM | POA: Diagnosis not present

## 2024-06-19 DIAGNOSIS — I5032 Chronic diastolic (congestive) heart failure: Secondary | ICD-10-CM | POA: Diagnosis not present

## 2024-06-19 DIAGNOSIS — I2510 Atherosclerotic heart disease of native coronary artery without angina pectoris: Secondary | ICD-10-CM

## 2024-06-19 DIAGNOSIS — I110 Hypertensive heart disease with heart failure: Secondary | ICD-10-CM | POA: Diagnosis not present

## 2024-06-19 DIAGNOSIS — R319 Hematuria, unspecified: Secondary | ICD-10-CM | POA: Diagnosis not present

## 2024-06-19 DIAGNOSIS — N179 Acute kidney failure, unspecified: Secondary | ICD-10-CM

## 2024-06-19 DIAGNOSIS — R310 Gross hematuria: Secondary | ICD-10-CM | POA: Diagnosis not present

## 2024-06-19 HISTORY — PX: CYSTOSCOPY WITH FULGERATION: SHX6638

## 2024-06-19 LAB — CBC
HCT: 20.7 % — ABNORMAL LOW (ref 39.0–52.0)
Hemoglobin: 6.9 g/dL — CL (ref 13.0–17.0)
MCH: 30.9 pg (ref 26.0–34.0)
MCHC: 33.3 g/dL (ref 30.0–36.0)
MCV: 92.8 fL (ref 80.0–100.0)
Platelets: 126 K/uL — ABNORMAL LOW (ref 150–400)
RBC: 2.23 MIL/uL — ABNORMAL LOW (ref 4.22–5.81)
RDW: 15.7 % — ABNORMAL HIGH (ref 11.5–15.5)
WBC: 12.7 K/uL — ABNORMAL HIGH (ref 4.0–10.5)
nRBC: 0 % (ref 0.0–0.2)

## 2024-06-19 LAB — BASIC METABOLIC PANEL WITH GFR
Anion gap: 11 (ref 5–15)
BUN: 44 mg/dL — ABNORMAL HIGH (ref 8–23)
CO2: 17 mmol/L — ABNORMAL LOW (ref 22–32)
Calcium: 8 mg/dL — ABNORMAL LOW (ref 8.9–10.3)
Chloride: 114 mmol/L — ABNORMAL HIGH (ref 98–111)
Creatinine, Ser: 2.33 mg/dL — ABNORMAL HIGH (ref 0.61–1.24)
GFR, Estimated: 27 mL/min — ABNORMAL LOW (ref >60)
Glucose, Bld: 75 mg/dL (ref 70–99)
Potassium: 4.3 mmol/L (ref 3.5–5.1)
Sodium: 142 mmol/L (ref 135–145)

## 2024-06-19 LAB — URINE CULTURE: Culture: NO GROWTH

## 2024-06-19 LAB — PREPARE RBC (CROSSMATCH)

## 2024-06-19 MED ORDER — IOHEXOL 350 MG/ML SOLN: 40.0000 mL | Freq: Once | INTRAVENOUS | Status: DC | PRN

## 2024-06-19 MED ORDER — LIDOCAINE HCL (CARDIAC) PF 100 MG/5ML IV SOSY
PREFILLED_SYRINGE | INTRAVENOUS | Status: DC | PRN
  Administered 2024-06-19: 60 mg via INTRAVENOUS

## 2024-06-19 MED ORDER — ACETAMINOPHEN 10 MG/ML IV SOLN: 1000.0000 mg | Freq: Once | INTRAVENOUS | Status: DC | PRN

## 2024-06-19 MED ORDER — LACTATED RINGERS IV SOLN
INTRAVENOUS | Status: DC | PRN
Start: 2024-06-19 — End: 2024-06-19

## 2024-06-19 MED ORDER — EPHEDRINE SULFATE (PRESSORS) 25 MG/5ML IV SOSY
PREFILLED_SYRINGE | INTRAVENOUS | Status: DC | PRN
Start: 2024-06-19 — End: 2024-06-19
  Administered 2024-06-19: 5 mg via INTRAVENOUS

## 2024-06-19 MED ORDER — FENTANYL CITRATE (PF) 100 MCG/2ML IJ SOLN
INTRAMUSCULAR | Status: AC
  Filled 2024-06-19: qty 2

## 2024-06-19 MED ORDER — ONDANSETRON HCL 4 MG/2ML IJ SOLN: 4.0000 mg | Freq: Once | INTRAMUSCULAR | Status: DC | PRN

## 2024-06-19 MED ORDER — PROPOFOL 10 MG/ML IV BOLUS
INTRAVENOUS | Status: DC | PRN
  Administered 2024-06-19: 40 mg via INTRAVENOUS
  Administered 2024-06-19: 20 mg via INTRAVENOUS

## 2024-06-19 MED ORDER — FENTANYL CITRATE (PF) 100 MCG/2ML IJ SOLN: 25.0000 ug | INTRAMUSCULAR | Status: DC | PRN

## 2024-06-19 MED ORDER — PROPOFOL 500 MG/50ML IV EMUL
INTRAVENOUS | Status: DC | PRN
Start: 2024-06-19 — End: 2024-06-19
  Administered 2024-06-19: 40 ug/kg/min via INTRAVENOUS

## 2024-06-19 MED ORDER — OXYCODONE HCL 5 MG PO TABS
5.0000 mg | ORAL_TABLET | Freq: Once | ORAL | Status: AC | PRN
  Administered 2024-06-19: 5 mg via ORAL

## 2024-06-19 MED ORDER — SODIUM CHLORIDE 0.9% IV SOLUTION: Freq: Once | INTRAVENOUS | Status: AC

## 2024-06-19 MED ORDER — OXYCODONE HCL 5 MG/5ML PO SOLN: 5.0000 mg | Freq: Once | ORAL | Status: AC | PRN

## 2024-06-19 MED ORDER — FENTANYL CITRATE (PF) 100 MCG/2ML IJ SOLN
INTRAMUSCULAR | Status: DC | PRN
  Administered 2024-06-19 (×4): 25 ug via INTRAVENOUS

## 2024-06-19 MED ORDER — ORAL CARE MOUTH RINSE: 15.0000 mL | Freq: Once | OROMUCOSAL | Status: DC

## 2024-06-19 MED ORDER — OXYCODONE HCL 5 MG PO TABS
ORAL_TABLET | ORAL | Status: AC
  Filled 2024-06-19: qty 1

## 2024-06-19 MED ORDER — PROPOFOL 10 MG/ML IV BOLUS
INTRAVENOUS | Status: AC
  Filled 2024-06-19: qty 20

## 2024-06-19 MED ORDER — PHENYLEPHRINE HCL-NACL 20-0.9 MG/250ML-% IV SOLN
INTRAVENOUS | Status: DC | PRN
  Administered 2024-06-19: 40 ug/min via INTRAVENOUS

## 2024-06-19 MED ORDER — SODIUM CHLORIDE 0.9 % IV SOLN: INTRAVENOUS | Status: DC

## 2024-06-19 MED ORDER — SODIUM CHLORIDE 0.9 % IR SOLN
Status: DC | PRN
  Administered 2024-06-19 (×3): 3000 mL via INTRAVESICAL
  Administered 2024-06-19: 6000 mL via INTRAVESICAL
  Administered 2024-06-19: 3000 mL via INTRAVESICAL

## 2024-06-19 MED ORDER — SODIUM CHLORIDE 0.9 % IR SOLN
Status: DC | PRN
  Administered 2024-06-19: 1000 mL

## 2024-06-19 MED ORDER — ALBUMIN HUMAN 5 % IV SOLN: INTRAVENOUS | Status: DC | PRN

## 2024-06-19 MED ORDER — CHLORHEXIDINE GLUCONATE 0.12 % MT SOLN: 15.0000 mL | Freq: Once | OROMUCOSAL | Status: DC

## 2024-06-19 NOTE — Progress Notes (Signed)
 Due to continued need to irrigate catheter and continue catheter obstruction we made the decision to take the patient to the OR for cystoscopy clot evacuation.  We discussed risk benefits alternatives of the procedure including bleeding infection demonstrating structures possible perforation the bladder needing long-term catheter possible needing open procedure close the bladder possible injury to the ureteral orifices.  Patient voiced understanding consent was obtained.

## 2024-06-19 NOTE — Progress Notes (Signed)
 Report given to nurse Latoya. Per Dr Newell traction to be removed at 3 am and  Titrate CBI until output pale pink in color.  06/19/24 2209  PACU CARE HANDOFF  Report given to: Given to Floor  Comments: Latoya

## 2024-06-19 NOTE — Progress Notes (Addendum)
 PROGRESS NOTE    Drew Adams  FMW:[id number] DOB: 01-Jan-1941 DOA: 06/17/2024 PCP: Stacia Millman, PA   Brief Narrative: Drew Adams is a 83 y.o. male with a history of aortic stenosis s/p TAVR, hyperlipidemia, CAD, CKD, BPH, urinary retention.  Patient presented secondary to a fall, suffering a scalp laceration with evidence of UTI and gross hematuria with resultant anemia requiring blood transfusion. Antibiotics started. Urology consulted. Hematuria catheter placed and CBI started.   Assessment and Plan:  Fall Unclear etiology but could possibly related to orthostatic hypotension. Patient with evidence of hypotension overnight while admitted. Likely contributed to by acute anemia from gross hematuria. PT/OT recommending home health therapy on discharge. -SLP consultation for cognitive evaluation  Head laceration Secondary to fall. CT head/cervical spine unremarkable for acute process. Laceration sutured by EDP; 4 sutures placed on 11/22. -Remove sutures in 7-10 days from placement.  Leukocytosis Patient did not meet SIRS criteria on admission. Associated lactic acidosis. Likely related to underlying urinary tract infection and possible associated dehydration. WBC trended down significantly. -CBC daily  Hypotension Likely related to acute anemia, rather than infectious etiology. Resolved with IV fluids.  Acute urinary retention BPH Patient with recent foley catheter placement for management. Catheter replaced this admission.  -Continue foley catheter -Continue tamsulosin   Gross hematuria Unclear etiology. Possibly related to cystitis versus trauma in setting of underlying BPH and foley placement. Complicated by home Aspirin , in addition to subcutaneous heparin  started this admission for VTE prophylaxis. Patient with associated anemia requiring blood transfusion. Foley catheter in place on admission. Urology consulted and converted patient to a hematuria catheter, starting  CBI. Urine continues to show evidence of hematuria -Urology recommendations: pending today  Acute blood loss anemia Secondary to gross hematuria. Hemoglobin of 10.1 on admission, although more recent baseline appears to be between 8-9. Patient has required 1 unit of PRBC via transfusion to date. Hemoglobin low again today. -Transfuse 1 unit PRBC -Post-transfusion H&H  History of cystitis UTI Presumed hemorrhagic cystitis. Patient treated with antibiotics during prior hospitalization. Cefepime  started this admission. Urine culture obtained. -Continue Cefepime  -Follow-up urine culture  AKI on CKD stage 3a Baseline creatinine appear to be around 1.3 from earlier this year, prior to recent urologic issues. Creatinine of 2.26 on admission and has trended up to 2.42. Concern this is related to obstructive cause, as he had this issue on his most recent admission. Renal ultrasound significant for evidence of no hydronephrosis (post-foley exchange). Creatinine trended down slightly. Urine output is complicated by CBI documentation -Trend BMP  Thrombocytopenia Likely related to acute blood loss. -Trend with CBC  Left renal cyst Noted on imaging. Recommendation for follow-up ultrasound in 6 months.  History of TAVR Noted.  CAD Noted. No chest pain. -Hold aspirin  pending urology recommendations  Pressure injury Present on admission. Right buttocks.   DVT prophylaxis: SCDs Code Status:   Code Status: Full Code Family Communication: None at bedside. Patient declined for me to update family today. Disposition Plan: Discharge pending stable hemoglobin, urology recommendations, stable renal function   Consultants:  Urology  Procedures:  None  Antimicrobials: Cefepime     Subjective: No specific concerns this morning. Abdominal discomfort is improved from yesterday.  Objective: BP 114/63 (BP Location: Left Arm)   Pulse 86   Temp 97.7 F (36.5 C) (Oral)   Resp 17   Ht 5' 10  (1.778 m)   Wt 81.6 kg   SpO2 97%   BMI 25.83 kg/m   Examination:  General exam: Appears calm  and comfortable. Respiratory system: Clear to auscultation. Respiratory effort normal. Cardiovascular system: S1 & S2 heard, RRR. Gastrointestinal system: Lower abdomen is nondistended, soft and nontender. Normal bowel sounds heard. Central nervous system: Alert and oriented. Musculoskeletal: No edema. No calf tenderness Psychiatry: Judgement and insight appear normal. Mood & affect appropriate.    Data Reviewed: I have personally reviewed following labs and imaging studies  CBC Lab Results  Component Value Date   WBC 12.7 (H) 06/19/2024   RBC 2.23 (L) 06/19/2024   HGB 6.9 (LL) 06/19/2024   HCT 20.7 (L) 06/19/2024   MCV 92.8 06/19/2024   MCH 30.9 06/19/2024   PLT 126 (L) 06/19/2024   MCHC 33.3 06/19/2024   RDW 15.7 (H) 06/19/2024   LYMPHSABS 1.1 06/17/2024   MONOABS 1.6 (H) 06/17/2024   EOSABS 0.1 06/17/2024   BASOSABS 0.1 06/17/2024     Last metabolic panel Lab Results  Component Value Date   NA 142 06/19/2024   K 4.3 06/19/2024   CL 114 (H) 06/19/2024   CO2 17 (L) 06/19/2024   BUN 44 (H) 06/19/2024   CREATININE 2.33 (H) 06/19/2024   GLUCOSE 75 06/19/2024   GFRNONAA 27 (L) 06/19/2024   GFRAA 65 (L) 12/30/2012   CALCIUM  8.0 (L) 06/19/2024   PHOS 3.6 06/14/2024   PROT 5.5 (L) 06/17/2024   ALBUMIN  2.9 (L) 06/17/2024   BILITOT 1.9 (H) 06/17/2024   ALKPHOS 57 06/17/2024   AST 18 06/17/2024   ALT 7 06/17/2024   ANIONGAP 11 06/19/2024    GFR: Estimated Creatinine Clearance: 25.7 mL/min (A) (by C-G formula based on SCr of 2.33 mg/dL (H)).  Recent Results (from the past 240 hours)  Urine Culture     Status: None   Collection Time: 06/11/24  5:23 PM   Specimen: Urine, Clean Catch  Result Value Ref Range Status   Specimen Description URINE, CLEAN CATCH  Final   Special Requests NONE  Final   Culture   Final    NO GROWTH Performed at [facility], [address], [place], [place] [address]    Report Status 06/12/2024 FINAL  Final  Blood Culture (routine x 2)     Status: None   Collection Time: 06/11/24  8:22 PM   Specimen: BLOOD  Result Value Ref Range Status   Specimen Description BLOOD BLOOD RIGHT HAND  Final   Special Requests   Final    BOTTLES DRAWN AEROBIC AND ANAEROBIC Blood Culture adequate volume   Culture   Final    NO GROWTH 5 DAYS Performed at [facility], [address] [place], [place] [address]    Report Status 06/16/2024 FINAL  Final  Urine Culture     Status: None   Collection Time: 06/11/24  8:23 PM   Specimen: Urine, Clean Catch  Result Value Ref Range Status   Specimen Description URINE, CLEAN CATCH  Final   Special Requests NONE  Final   Culture   Final    NO GROWTH Performed at [facility], [address], [place], [place] [address]    Report Status 06/13/2024 FINAL  Final  Blood Culture (routine x 2)     Status: None   Collection Time: 06/11/24  8:27 PM   Specimen: BLOOD  Result Value Ref Range Status   Specimen Description BLOOD BLOOD RIGHT HAND  Final   Special Requests   Final    BOTTLES DRAWN AEROBIC AND ANAEROBIC Blood Culture results may not be optimal  due to an inadequate volume of blood received in culture bottles   Culture   Final    NO GROWTH 5 DAYS Performed at [facility], [address] [place], [place] [address]    Report Status 06/16/2024 FINAL  Final  Resp panel by RT-PCR (RSV, Flu A&B, Covid) Anterior Nasal Swab     Status: None   Collection Time: 06/12/24  8:38 AM   Specimen: Anterior Nasal Swab  Result Value Ref Range Status   SARS Coronavirus 2 by RT PCR NEGATIVE NEGATIVE Final   Influenza A by PCR NEGATIVE NEGATIVE Final   Influenza B by PCR NEGATIVE NEGATIVE Final    Comment: (NOTE) The Xpert Xpress SARS-CoV-2/FLU/RSV plus assay is intended as an aid in the diagnosis of influenza from Nasopharyngeal swab specimens and should not be used as a sole basis  for treatment. Nasal washings and aspirates are unacceptable for Xpert Xpress SARS-CoV-2/FLU/RSV testing.  Fact Sheet for Patients: bloggercourse.com  Fact Sheet for Healthcare Providers: seriousbroker.it  This test is not yet approved or cleared by the [place]  FDA and has been authorized for detection and/or diagnosis of SARS-CoV-2 by FDA under an Emergency Use Authorization (EUA). This EUA will remain in effect (meaning this test can be used) for the duration of the COVID-19 declaration under Section 564(b)(1) of the Act, 21 U.S.C. section 360bbb-3(b)(1), unless the authorization is terminated or revoked.     Resp Syncytial Virus by PCR NEGATIVE NEGATIVE Final    Comment: (NOTE) Fact Sheet for Patients: bloggercourse.com  Fact Sheet for Healthcare Providers: seriousbroker.it  This test is not yet approved or cleared by the [place]  FDA and has been authorized for detection and/or diagnosis of SARS-CoV-2 by FDA under an Emergency Use Authorization (EUA). This EUA will remain in effect (meaning this test can be used) for the duration of the COVID-19 declaration under Section 564(b)(1) of the Act, 21 U.S.C. section 360bbb-3(b)(1), unless the authorization is terminated or revoked.  Performed at [facility], [address], [place], [place] [address]   Blood culture (routine x 2)     Status: None (Preliminary result)   Collection Time: 06/17/24  4:53 AM   Specimen: BLOOD RIGHT HAND  Result Value Ref Range Status   Specimen Description BLOOD RIGHT HAND  Final   Special Requests   Final    BOTTLES DRAWN AEROBIC ONLY Blood Culture results may not be optimal due to an inadequate volume of blood received in culture bottles   Culture   Final    NO GROWTH 2 DAYS Performed at [facility], [address], [place], [place] [address]    Report Status PENDING   Incomplete  Blood culture (routine x 2)     Status: None (Preliminary result)   Collection Time: 06/17/24  6:15 AM   Specimen: BLOOD LEFT ARM  Result Value Ref Range Status   Specimen Description BLOOD LEFT ARM  Final   Special Requests   Final    BOTTLES DRAWN AEROBIC AND ANAEROBIC Blood Culture results may not be optimal due to an inadequate volume of blood received in culture bottles   Culture   Final    NO GROWTH 2 DAYS Performed at [facility], [address], [place], [place] [address]    Report Status PENDING  Incomplete      Radiology Studies: US  RENAL Result Date: 06/18/2024 EXAM: US  Retroperitoneum Complete, Renal. 06/18/2024 11:26:22 PM TECHNIQUE: Real-time ultrasonography of the retroperitoneum  renal was performed. COMPARISON: None available CLINICAL HISTORY: Acute kidney injury. FINDINGS: FINDINGS: RIGHT KIDNEY/URETER: Right kidney measures 8.6 x 6.1 x 6.6 cm. Normal cortical echogenicity. Lower pole poorly visualized due to overlying bowel gas. LEFT KIDNEY/URETER: Left kidney measures 10.3 x 5.9 x 5.7 cm. Normal cortical echogenicity. A somewhat lobulated cystic lesion is noted in the lower pole of the left kidney, measuring 3.6x3.0x3.1 cm. It is difficult to assess whether this represents a mildly complex cyst or two adjacent simple cysts. BLADDER: Unremarkable appearance of the bladder. IMPRESSION: 1. No hydronephrosis is noted. 2. Cystic lesion as described in the lower pole of the left kidney. It is difficult to assess whether this represents two adjacent cysts or one larger somewhat lobulated cystic lesion. The former is favored given the appearance on prior CT from 04/11/2024. A follow up ultrasound in 6 months would be helpful to assess for stability. Electronically signed by: Oneil Devonshire MD 06/18/2024 11:39 PM EST RP Workstation: [id number]      LOS: 1 day    Elgin Lam, MD [facility] 06/19/2024, 9:23 AM   If 7PM-7AM, please contact  night-coverage [email or web address]

## 2024-06-19 NOTE — Anesthesia Preprocedure Evaluation (Signed)
 Anesthesia Evaluation  Patient identified by MRN, date of birth, ID band Patient awake    Reviewed: Allergy & Precautions, NPO status , Patient's Chart, lab work & pertinent test results, reviewed documented beta blocker date and time   History of Anesthesia Complications Negative for: history of anesthetic complications  Airway Mallampati: II  TM Distance: >3 FB     Dental  (+) Poor Dentition   Pulmonary neg shortness of breath, neg COPD   breath sounds clear to auscultation       Cardiovascular hypertension, + CAD  (-) Past MI, (-) Cardiac Stents and (-) CABG  Rhythm:Regular Rate:Normal  IMPRESSIONS     1. Left ventricular ejection fraction, by estimation, is 55 to 60%. The  left ventricle has normal function. The left ventricle has no regional  wall motion abnormalities. There is moderate concentric left ventricular  hypertrophy. Left ventricular  diastolic parameters are consistent with Grade I diastolic dysfunction  (impaired relaxation). The average left ventricular global longitudinal  strain is -15.1 %. The global longitudinal strain is abnormal.   2. Right ventricular systolic function is normal. The right ventricular  size is normal.   3. Left atrial size was severely dilated.   4. Posterior leaflet severely restricted. The mitral valve is  degenerative. Mild mitral valve regurgitation. No evidence of mitral  stenosis. The mean mitral valve gradient is 4.0 mmHg with average heart  rate of 81 bpm. Severe mitral annular  calcification.   5. Mild perivalvular regurgitation. The aortic valve has been  repaired/replaced. Aortic valve regurgitation is mild. No aortic stenosis  is present. There is a 29 mm Sapien prosthetic (TAVR) valve present in the  aortic position. Aortic valve area, by VTI   measures 2.12 cm. Aortic valve mean gradient measures 10.0 mmHg. Aortic  valve Vmax measures 2.44 m/s.   6. Aortic  dilatation noted. There is mild dilatation of the ascending  aorta, measuring 43 mm.   7. The inferior vena cava is normal in size with greater than 50%  respiratory variability, suggesting right atrial pressure of 3 mmHg.      Neuro/Psych  Headaches TIA Neuromuscular disease    GI/Hepatic hiatal hernia,,,(+) neg Cirrhosis        Endo/Other    Renal/GU Renal disease     Musculoskeletal   Abdominal   Peds  Hematology  (+) Blood dyscrasia, anemia   Anesthesia Other Findings   Reproductive/Obstetrics                              Anesthesia Physical Anesthesia Plan  ASA: 3  Anesthesia Plan: MAC   Post-op Pain Management:    Induction: Intravenous  PONV Risk Score and Plan: 1 and Ondansetron  and Propofol  infusion  Airway Management Planned: Natural Airway and Simple Face Mask  Additional Equipment:   Intra-op Plan:   Post-operative Plan:   Informed Consent: I have reviewed the patients History and Physical, chart, labs and discussed the procedure including the risks, benefits and alternatives for the proposed anesthesia with the patient or authorized representative who has indicated his/her understanding and acceptance.     Dental advisory given  Plan Discussed with: CRNA  Anesthesia Plan Comments:          Anesthesia Quick Evaluation

## 2024-06-19 NOTE — Consult Note (Signed)
 Urology Consult   Physician requesting consult: Elgin Lam, MD   Reason for consult: Gross hematuria   History of Present Illness: Drew Adams is a 83 y.o. male with PMH BPH and recent admission for urinary retention (11/16-11/20) who was re-admitted 11/22 after a fall resulting in a scalp laceration. Hgb on arrival was 10.1 and frank hematuria was noted. Foley catheter was exchanged with worsening of hematuria.   Hgb this morning 6.1. Urology was consulted given hematuria requiring transfusion. On exam, 16 Fr 2-way catheter was unable to be irrigated and was found to be inflated in the prostatic urethra. This was exchanged for a 22 Fr 3-way hematuria catheter. 1.5 L of soft clot was hand irrigated from patient's bladder. CBI was started and urine was clear pink on moderate drip.   Interval 11/24: Catheter intermittently clotting off requiring manual irrigation. Cr 2.33 from 2.42 yesterday. Hgb 6.9 from 7.8. CT cystogram ordered by Dr. Shane with some residual clot burden. Catheter irrigated with return of small amount of old-appearing clot. Urine draining thin pink urine on moderate drip CBI   Past Medical History:  Diagnosis Date   Aortic stenosis    s/p TAVR on 05/30/24   Aortic valve disease    BPH (benign prostatic hyperplasia)    Diverticulosis of colon without hemorrhage 11/28/2012   ED (erectile dysfunction)    Elevated PSA    Gout    uses prednisone  1-2x/week   Hypertension    Iron deficiency anemia 11/28/2012   Mitral valve disease    Mixed hyperlipidemia    Ocular migraine    Paraesophageal hiatal hernia 11/28/2012   Radiculopathy    Thoracic aortic aneurysm (TAA)     Past Surgical History:  Procedure Laterality Date   CATARACT EXTRACTION Right 2012   CATARACT EXTRACTION Left Jan 2014   CORONARY ANGIOGRAPHY N/A 05/03/2024   Procedure: CORONARY ANGIOGRAPHY;  Surgeon: Verlin Lonni BIRCH, MD;  Location: [facility];  Service: Cardiovascular;  Laterality:  N/A;   HERNIA REPAIR Right 1970s   open RIH repair   INTRAOPERATIVE TRANSTHORACIC ECHOCARDIOGRAM N/A 05/30/2024   Procedure: ECHOCARDIOGRAM, TRANSTHORACIC;  Surgeon: Wendel Lurena POUR, MD;  Location: [facility];  Service: Cardiovascular;  Laterality: N/A;   LAPAROSCOPIC PARTIAL COLECTOMY N/A 12/27/2012   Procedure: LAPAROSCOPIC  PARTIAL COLECTOMY RIGID PROCTOSCOPY ;  Surgeon: Elspeth KYM Schultze, MD;  Location: [facility];  Service: General;  Laterality: N/A;   LAPAROSCOPIC SIGMOID COLECTOMY  12/27/2012   TONSILLECTOMY AND ADENOIDECTOMY      Current Hospital Medications:  Home Meds:  No current facility-administered medications on file prior to encounter.   Current Outpatient Medications on File Prior to Encounter  Medication Sig Dispense Refill   cephALEXin  (KEFLEX ) 500 MG capsule Take 1 capsule (500 mg total) by mouth 2 (two) times daily for 3 days. 6 capsule 0   allopurinol  (ZYLOPRIM ) 300 MG tablet Take 300 mg by mouth daily.     Ascorbic Acid  (VITAMIN C ) 1000 MG tablet Take 1,000 mg by mouth daily.     aspirin  81 MG chewable tablet Chew 1 tablet (81 mg total) by mouth daily.     calcium  citrate (CALCITRATE - DOSED IN MG ELEMENTAL CALCIUM ) 950 MG tablet Take 600 mg by mouth daily.     cyanocobalamin  (VITAMIN B12) 1000 MCG tablet Take 1,000 mcg by mouth daily.     finasteride  (PROSCAR ) 5 MG tablet Take 5 mg by mouth daily.     hydrocortisone cream 1 % Apply 1  Application topically daily.     MAGNESIUM  GLYCINATE PO Take 400 mg by mouth daily.     meclizine  (ANTIVERT ) 25 MG tablet Take 1 tablet (25 mg total) by mouth 3 (three) times daily as needed for dizziness. 30 tablet 0   Multiple Vitamin (MULTIVITAMIN WITH MINERALS) TABS Take 1 tablet by mouth daily. Mature     OVER THE COUNTER MEDICATION Take 1 tablet by mouth daily. prostacor     oxymetazoline  (AFRIN) 0.05 % nasal spray Place 1 spray into both nostrils 2 (two) times daily. 30 mL 0   pantoprazole  (PROTONIX ) 40 MG tablet Take 1 tablet  (40 mg total) by mouth daily. 30 tablet 0   predniSONE  (DELTASONE ) 5 MG tablet Take 5 mg by mouth daily as needed (Gout).     psyllium (METAMUCIL) 58.6 % packet Take 1 packet by mouth daily. 10 ml     PURE L-ARGININE HCL PO Take 1,800 mg by mouth daily.     sodium chloride  (OCEAN) 0.65 % SOLN nasal spray Place 1 spray into both nostrils 4 (four) times daily. 50 mL 0   tamsulosin  (FLOMAX ) 0.4 MG CAPS capsule Take 1 capsule (0.4 mg total) by mouth daily. 30 capsule 0   Zinc  Gluconate 15 MG TABS Take 50 mg by mouth daily. Plus copper 2 mg       Scheduled Meds:  sodium chloride    Intravenous Once   allopurinol   300 mg Oral Daily   Chlorhexidine  Gluconate Cloth  6 each Topical Daily   finasteride   5 mg Oral Daily   pantoprazole   40 mg Oral Daily   sodium chloride  flush  3 mL Intravenous Q12H   tamsulosin   0.4 mg Oral Daily   Continuous Infusions:  sodium chloride  50 mL/hr at 06/18/24 0543   ceFEPime  (MAXIPIME ) IV 2 g (06/19/24 0529)   vancomycin  1,500 mg (06/19/24 0910)   PRN Meds:.acetaminophen  **OR** acetaminophen , albuterol , HYDROcodone -acetaminophen , iohexol , meclizine , ondansetron  **OR** ondansetron  (ZOFRAN ) IV  Allergies:  Allergies  Allergen Reactions   Crestor  [Rosuvastatin ] Other (See Comments)    Leg pain and cramping   Exforge [Amlodipine Besylate-Valsartan] Other (See Comments)    Myalgia    Simvastatin Other (See Comments)    Family History  Problem Relation Age of Onset   Heart attack Mother    CAD Mother    Parkinson's disease Father    Diabetes Father    Cancer Sister    Diabetes Sister     Social History:  reports that he has never smoked. He has never used smokeless tobacco. He reports that he does not drink alcohol and does not use drugs.  ROS: A complete review of systems was performed.  All systems are negative except for pertinent findings as noted.  Physical Exam:  Vital signs in last 24 hours: Temp:  [97.7 F (36.5 C)-97.9 F (36.6 C)] 97.7 F  (36.5 C) (11/24 0801) Pulse Rate:  [80-86] 86 (11/24 0801) Resp:  [16-19] 17 (11/24 0801) BP: (93-114)/(48-63) 114/63 (11/24 0801) SpO2:  [92 %-98 %] 97 % (11/24 0801) Constitutional:  Alert and oriented, No acute distress Head: Scalp laceration repaired Cardiovascular: Regular rate and rhythm, No JVD Respiratory: Normal respiratory effort, Lungs clear bilaterally GI: Abdomen is soft, nontender, nondistended, no abdominal masses GU: No CVA tenderness. After irrigation, foley draining clear pink urine on moderate drip CBI  Lymphatic: No lymphadenopathy Neurologic: Grossly intact, no focal deficits Psychiatric: Normal mood and affect  Laboratory Data:  Recent Labs    06/17/24 0413 06/18/24  9453 06/18/24 0740 06/18/24 1938 06/19/24 0811  WBC 27.2* 21.7*  --   --  12.7*  HGB 10.1* 6.6* 6.1* 7.8* 6.9*  HCT 32.3* 20.5* 19.0* 24.0* 20.7*  PLT 218 131*  --   --  126*    Recent Labs    06/17/24 0413 06/18/24 0546 06/19/24 0811  NA 140 140 142  K 4.8 3.8 4.3  CL 106 112* 114*  GLUCOSE 132* 110* 75  BUN 38* 40* 44*  CALCIUM  8.7* 7.8* 8.0*  CREATININE 2.26* 2.42* 2.33*     Results for orders placed or performed during the hospital encounter of 06/17/24 (from the past 24 hours)  Hemoglobin and hematocrit, blood     Status: Abnormal   Collection Time: 06/18/24  7:38 PM  Result Value Ref Range   Hemoglobin 7.8 (L) 13.0 - 17.0 g/dL   HCT 75.9 (L) 60.9 - 47.9 %  Basic metabolic panel with GFR     Status: Abnormal   Collection Time: 06/19/24  8:11 AM  Result Value Ref Range   Sodium 142 135 - 145 mmol/L   Potassium 4.3 3.5 - 5.1 mmol/L   Chloride 114 (H) 98 - 111 mmol/L   CO2 17 (L) 22 - 32 mmol/L   Glucose, Bld 75 70 - 99 mg/dL   BUN 44 (H) 8 - 23 mg/dL   Creatinine, Ser 7.66 (H) 0.61 - 1.24 mg/dL   Calcium  8.0 (L) 8.9 - 10.3 mg/dL   GFR, Estimated 27 (L) >60 mL/min   Anion gap 11 5 - 15  CBC     Status: Abnormal   Collection Time: 06/19/24  8:11 AM  Result Value Ref  Range   WBC 12.7 (H) 4.0 - 10.5 K/uL   RBC 2.23 (L) 4.22 - 5.81 MIL/uL   Hemoglobin 6.9 (LL) 13.0 - 17.0 g/dL   HCT 79.2 (L) 60.9 - 47.9 %   MCV 92.8 80.0 - 100.0 fL   MCH 30.9 26.0 - 34.0 pg   MCHC 33.3 30.0 - 36.0 g/dL   RDW 84.2 (H) 88.4 - 84.4 %   Platelets 126 (L) 150 - 400 K/uL   nRBC 0.0 0.0 - 0.2 %  Prepare RBC (crossmatch)     Status: None   Collection Time: 06/19/24  8:48 AM  Result Value Ref Range   Order Confirmation      ORDER PROCESSED BY BLOOD BANK Performed at [facility], [address], [place], [place] [address]    Recent Results (from the past 240 hours)  Urine Culture     Status: None   Collection Time: 06/11/24  5:23 PM   Specimen: Urine, Clean Catch  Result Value Ref Range Status   Specimen Description URINE, CLEAN CATCH  Final   Special Requests NONE  Final   Culture   Final    NO GROWTH Performed at [facility], [address] [place], [place] [address]    Report Status 06/12/2024 FINAL  Final  Blood Culture (routine x 2)     Status: None   Collection Time: 06/11/24  8:22 PM   Specimen: BLOOD  Result Value Ref Range Status   Specimen Description BLOOD BLOOD RIGHT HAND  Final   Special Requests   Final    BOTTLES DRAWN AEROBIC AND ANAEROBIC Blood Culture adequate volume   Culture   Final    NO GROWTH 5 DAYS Performed at [facility], [address] [place], [place] [address]  Report Status 06/16/2024 FINAL  Final  Urine Culture     Status: None   Collection Time: 06/11/24  8:23 PM   Specimen: Urine, Clean Catch  Result Value Ref Range Status   Specimen Description URINE, CLEAN CATCH  Final   Special Requests NONE  Final   Culture   Final    NO GROWTH Performed at [facility], [address], [place], [place] [address]    Report Status 06/13/2024 FINAL  Final  Blood Culture (routine x 2)     Status: None   Collection Time: 06/11/24  8:27 PM   Specimen: BLOOD  Result Value Ref Range Status   Specimen  Description BLOOD BLOOD RIGHT HAND  Final   Special Requests   Final    BOTTLES DRAWN AEROBIC AND ANAEROBIC Blood Culture results may not be optimal due to an inadequate volume of blood received in culture bottles   Culture   Final    NO GROWTH 5 DAYS Performed at [facility], [address], [place], [place] [address]    Report Status 06/16/2024 FINAL  Final  Resp panel by RT-PCR (RSV, Flu A&B, Covid) Anterior Nasal Swab     Status: None   Collection Time: 06/12/24  8:38 AM   Specimen: Anterior Nasal Swab  Result Value Ref Range Status   SARS Coronavirus 2 by RT PCR NEGATIVE NEGATIVE Final   Influenza A by PCR NEGATIVE NEGATIVE Final   Influenza B by PCR NEGATIVE NEGATIVE Final    Comment: (NOTE) The Xpert Xpress SARS-CoV-2/FLU/RSV plus assay is intended as an aid in the diagnosis of influenza from Nasopharyngeal swab specimens and should not be used as a sole basis for treatment. Nasal washings and aspirates are unacceptable for Xpert Xpress SARS-CoV-2/FLU/RSV testing.  Fact Sheet for Patients: bloggercourse.com  Fact Sheet for Healthcare Providers: seriousbroker.it  This test is not yet approved or cleared by the [place]  FDA and has been authorized for detection and/or diagnosis of SARS-CoV-2 by FDA under an Emergency Use Authorization (EUA). This EUA will remain in effect (meaning this test can be used) for the duration of the COVID-19 declaration under Section 564(b)(1) of the Act, 21 U.S.C. section 360bbb-3(b)(1), unless the authorization is terminated or revoked.     Resp Syncytial Virus by PCR NEGATIVE NEGATIVE Final    Comment: (NOTE) Fact Sheet for Patients: bloggercourse.com  Fact Sheet for Healthcare Providers: seriousbroker.it  This test is not yet approved or cleared by the [place]  FDA and has been authorized for detection and/or diagnosis of  SARS-CoV-2 by FDA under an Emergency Use Authorization (EUA). This EUA will remain in effect (meaning this test can be used) for the duration of the COVID-19 declaration under Section 564(b)(1) of the Act, 21 U.S.C. section 360bbb-3(b)(1), unless the authorization is terminated or revoked.  Performed at [facility], [address], [place], [place] [address]   Blood culture (routine x 2)     Status: None (Preliminary result)   Collection Time: 06/17/24  4:53 AM   Specimen: BLOOD RIGHT HAND  Result Value Ref Range Status   Specimen Description BLOOD RIGHT HAND  Final   Special Requests   Final    BOTTLES DRAWN AEROBIC ONLY Blood Culture results may not be optimal due to an inadequate volume of blood received in culture bottles   Culture   Final    NO GROWTH 2 DAYS Performed at [facility], [address] [place],  [place] [address]    Report Status PENDING  Incomplete  Blood culture (routine x 2)     Status: None (Preliminary result)   Collection Time: 06/17/24  6:15 AM   Specimen: BLOOD LEFT ARM  Result Value Ref Range Status   Specimen Description BLOOD LEFT ARM  Final   Special Requests   Final    BOTTLES DRAWN AEROBIC AND ANAEROBIC Blood Culture results may not be optimal due to an inadequate volume of blood received in culture bottles   Culture   Final    NO GROWTH 2 DAYS Performed at [facility], [address], [place], [place] [address]    Report Status PENDING  Incomplete    Renal Function: Recent Labs    06/13/24 1022 06/14/24 0250 06/15/24 0236 06/16/24 0229 06/17/24 0413 06/18/24 0546 06/19/24 0811  CREATININE 3.66* 2.72* 1.99* 2.09* 2.26* 2.42* 2.33*   Estimated Creatinine Clearance: 25.7 mL/min (A) (by C-G formula based on SCr of 2.33 mg/dL (H)).  Radiologic Imaging: US  RENAL Result Date: 06/18/2024 EXAM: US  Retroperitoneum Complete, Renal. 06/18/2024 11:26:22 PM TECHNIQUE: Real-time ultrasonography of the retroperitoneum renal  was performed. COMPARISON: None available CLINICAL HISTORY: Acute kidney injury. FINDINGS: FINDINGS: RIGHT KIDNEY/URETER: Right kidney measures 8.6 x 6.1 x 6.6 cm. Normal cortical echogenicity. Lower pole poorly visualized due to overlying bowel gas. LEFT KIDNEY/URETER: Left kidney measures 10.3 x 5.9 x 5.7 cm. Normal cortical echogenicity. A somewhat lobulated cystic lesion is noted in the lower pole of the left kidney, measuring 3.6x3.0x3.1 cm. It is difficult to assess whether this represents a mildly complex cyst or two adjacent simple cysts. BLADDER: Unremarkable appearance of the bladder. IMPRESSION: 1. No hydronephrosis is noted. 2. Cystic lesion as described in the lower pole of the left kidney. It is difficult to assess whether this represents two adjacent cysts or one larger somewhat lobulated cystic lesion. The former is favored given the appearance on prior CT from 04/11/2024. A follow up ultrasound in 6 months would be helpful to assess for stability. Electronically signed by: Oneil Devonshire MD 06/18/2024 11:39 PM EST RP Workstation: [id number]    I independently reviewed the above imaging studies.  Impression/Recommendation Pt is a 83 yo male with PMH BPH and recent admission with urinary retention and AKI (Cr 13.7 from baseline 1.5) who re-presented 11/22 after a fall resulting in a scalp laceration. Pt had hematuria on admission, which has worsened in the past 24 hours (Hgb 10.1 --> 6.1).   He now has a 22 Fr 3-way hematuria catheter in place and is s/p irrigation of 1.5L soft clot from his bladder. He is not on CBI, with clear pink urine. Patient's initial hematuria was likely related to UTI (UA with large blood so not fully reliable but this may have been the cause of his fall) vs prostatic bleeding, which was worsened by catheter placement in his prostatic urethra. Would continue supportive measures and wean CBI at this time with plan for close outpatient follow-up.  - Continue CBI, titrate  to light pink urine   - If catheter stops draining, clamp CBI inflow, detach catheter from bag, and flush catheter with 60 cc NS with catheter-tip syringe  - [facility] if unable to restore patency - Agree with empiric treatment for UTI/antimicrobial coverage after extensive irrigation - Continue Flomax  and Finasteride  - Urology will continue to follow  Maurilio Agar 06/19/2024, 1:19 PM

## 2024-06-19 NOTE — Progress Notes (Signed)
 Occupational Therapy Treatment Patient Details Name: Drew Adams MRN: [id number] DOB: 09-07-1940 Today's Date: 06/19/2024   History of present illness 83 y.o. male admitted 06/17/24 after two falls at home. First fall was controlled by family and lowered to the floor without injury.  Had subsequent fall early this morning with head injury. Noted to be orthostatic with EMS. Pt sustained an S-shaped 4cm laceration to top of scalp, no active bleeding, secondary wounds to left parietal scalp. Foley in place, dried blood present around meatus and along foley tubing, some blood collected in foley bag. Rapid Response called 11/23 d/t hypotension (78/38), fluid bolus ordered. PMHx: aortic stenosis s/p TAVR (05/30/2024), CAD, CKD 3B, dementia, urinary retention, BPH, and prior TIA. Of note, recent admission 11/16-11/20 for AKI and urinary retention.   OT comments  Pt limited by orthostatic pressure this session:  Position BP MAP HR  Supine 99/55 70 84  Seated EOB 84/44 56 97  Seated EOB after 3 min 65/46 54 88  Supine 88/52 62 86  Supine HOB elevated 78/50 58 86  Supine HOB elevated after 5 min and BLE exercises 85/61 69 83   Pt remains motivated, min A +2 to come EOB, symptomatic today (yesterday was A symptomatic). Pt required increased level of care and increased assist with ADL due to symptoms and also increased pain in his penis from the foley flush system. Up to mod/max A for ADL today. OT will continue to recommend [facility] post-acute but if no improvement next session might need to consider post-acute rehab. Due to [facility] did not assess cognition today.       If plan is discharge home, recommend the following:  A little help with walking and/or transfers;A little help with bathing/dressing/bathroom;Assistance with cooking/housework;Assist for transportation;Supervision due to cognitive status   Equipment Recommendations  Tub/shower seat    Recommendations for Other Services PT consult;Speech  consult    Precautions / Restrictions Precautions Precautions: Fall Recall of Precautions/Restrictions: Impaired Precaution/Restrictions Comments: Watch BP Restrictions Weight Bearing Restrictions Per Provider Order: No       Mobility Bed Mobility Overal bed mobility: Needs Assistance Bed Mobility: Supine to Sit, Sit to Supine     Supine to sit: Contact guard, Min assist, HOB elevated, Used rails, +2 for safety/equipment Sit to supine: Max assist, +2 for physical assistance, +2 for safety/equipment (helicopter technique)   General bed mobility comments: Pt symptomatic for orthostatics today, required helicopter technique to return supine quickly and safely    Transfers Overall transfer level: Needs assistance Equipment used: Rolling walker (2 wheels) Transfers: Sit to/from Stand Sit to Stand: Min assist, +2 safety/equipment, +2 physical assistance, From elevated surface           General transfer comment: Pt stood from elevated bed with min A +2 safety and was unable to weight shift for steps up the bed     Balance Overall balance assessment: Needs assistance Sitting-balance support: Bilateral upper extremity supported, Feet supported Sitting balance-Leahy Scale: Fair Sitting balance - Comments: Pt sat EOB with close supervision. Postural control: Posterior lean Standing balance support: Bilateral upper extremity supported, During functional activity, Reliant on assistive device for balance Standing balance-Leahy Scale: Poor Standing balance comment: Pt dependent on RW and external support of therapist. He developed a posterior lean and would brace back of legs on hospital bed for support as he fatigued.  ADL either performed or assessed with clinical judgement   ADL Overall ADL's : Needs assistance/impaired Eating/Feeding: Modified independent   Grooming: Wash/dry face;Wash/dry hands;Set up;Bed level (HOB elevated)                    Toilet Transfer: Moderate assistance;+2 for safety/equipment;BSC/3in1 Toilet Transfer Details (indicate cue type and reason): simulated - but limited by BP (OH+) Toileting- Clothing Manipulation and Hygiene: Maximal assistance Toileting - Clothing Manipulation Details (indicate cue type and reason): foley     Functional mobility during ADLs: Rolling walker (2 wheels);Minimal assistance;+2 for safety/equipment General ADL Comments: decreased safety awareness, orthostatic BP,    Extremity/Trunk Assessment Upper Extremity Assessment Upper Extremity Assessment: Generalized weakness            Vision       Perception     Praxis     Communication Communication Communication: No apparent difficulties   Cognition Arousal: Alert Behavior During Therapy: WFL for tasks assessed/performed Cognition: History of cognitive impairments                               Following commands: Intact        Cueing   Cueing Techniques: Verbal cues  Exercises Exercises: Other exercises Other Exercises Other Exercises: orthostatic hypertension quad flexion, ankle pumps    Shoulder Instructions       General Comments Assessed Orthostatic Vitals, (+). Pt symptomatic today    Pertinent Vitals/ Pain       Pain Assessment Pain Assessment: 0-10 Pain Score: 10-Worst pain ever Pain Location: penis Pain Descriptors / Indicators: Discomfort, Grimacing, Moaning, Sharp, Shooting Pain Intervention(s): Monitored during session, Repositioned  Home Living                                          Prior Functioning/Environment              Frequency  Min 2X/week        Progress Toward Goals  OT Goals(current goals can now be found in the care plan section)  Progress towards OT goals: Not progressing toward goals - comment (OH+)  Acute Rehab OT Goals Patient Stated Goal: relieve penis pain OT Goal Formulation: With patient Time For Goal  Achievement: 07/02/24 Potential to Achieve Goals: Good  Plan      Co-evaluation                 AM-PAC OT 6 Clicks Daily Activity     Outcome Measure   Help from another person eating meals?: None Help from another person taking care of personal grooming?: A Little Help from another person toileting, which includes using toliet, bedpan, or urinal?: A Little Help from another person bathing (including washing, rinsing, drying)?: A Little Help from another person to put on and taking off regular upper body clothing?: A Little Help from another person to put on and taking off regular lower body clothing?: A Little 6 Click Score: 19    End of Session Equipment Utilized During Treatment: Gait belt;Rolling walker (2 wheels)  OT Visit Diagnosis: Unsteadiness on feet (R26.81);Muscle weakness (generalized) (M62.81);Other symptoms and signs involving cognitive function   Activity Tolerance Treatment limited secondary to medical complications (Comment) (OH+)   Patient Left with call bell/phone within reach;with family/visitor present;in bed   Nurse Communication Mobility status;Other (  comment);Patient requests pain meds (OH+, 10/10 penis pain)        Time: 8982-8945 OT Time Calculation (min): 37 min  Charges: OT General Charges $OT Visit: 1 Visit OT Treatments $Self Care/Home Management : 8-22 mins $Therapeutic Activity: 8-22 mins  Leita DEL OTR/L Acute Rehabilitation Services Office: [phone]   Leita PARAS [facility] 06/19/2024, 11:31 AM

## 2024-06-19 NOTE — Progress Notes (Addendum)
 Patient brought to 4E from 5N. Telemetry box applied, CCMD notified. No new sign and held orders to release. Patient oriented to room and staff. Call bell in reach.   06/19/24 1548  Vitals  Temp 97.8 F (36.6 C)  Temp Source Oral  BP (!) 103/45  MAP (mmHg) (!) 61  BP Location Right Arm  BP Method Automatic  Patient Position (if appropriate) Lying  Pulse Rate 83  Pulse Rate Source Monitor  ECG Heart Rate 83  Resp 13  Level of Consciousness  Level of Consciousness Alert  MEWS COLOR  MEWS Score Color Green  Oxygen Therapy  SpO2 97 %  O2 Device Room Air  MEWS Score  MEWS Temp 0  MEWS Systolic 0  MEWS Pulse 0  MEWS RR 1  MEWS LOC 0  MEWS Score 1

## 2024-06-19 NOTE — Transfer of Care (Signed)
 Immediate Anesthesia Transfer of Care Note  Patient: Drew Adams  Procedure(s) Performed: CYSTOSCOPY, WITH BLADDER FULGURATION  Patient Location: PACU  Anesthesia Type:MAC  Level of Consciousness: awake, alert , and oriented  Airway & Oxygen Therapy: Patient Spontanous Breathing and Patient connected to face mask oxygen  Post-op Assessment: Report given to RN, Post -op Vital signs reviewed and stable, and Patient moving all extremities  Post vital signs: Reviewed and stable  Last Vitals:  Vitals Value Taken Time  BP 115/60 06/19/24 21:35  Temp 36.4 C 06/19/24 21:35  Pulse 89 06/19/24 21:41  Resp 18 06/19/24 21:41  SpO2 99 % 06/19/24 21:41  Vitals shown include unfiled device data.  Last Pain:  Vitals:   06/19/24 2135  TempSrc:   PainSc: 0-No pain         Complications: No notable events documented.

## 2024-06-19 NOTE — Progress Notes (Signed)
 Patient ID: Drew Adams, male   DOB: [birth year], 83 y.o.   MRN: [id number]  Patient's foley continues to clot off intermediately. Patient in 10/10 pain. Bladder scan 999+. Stopped CBI immediately, irrigated foley, Urology paged. Now draining pink and clear. Urology at bedside.Verdie JONETTA Collier, RN

## 2024-06-19 NOTE — Progress Notes (Signed)
 Patient ID: Drew Adams, male   DOB: [birth year], 83 y.o.   MRN: [id number] Patient being moved to [facility]. Daughter informed of transfer and OR procedure tonight. Report given to [facility].  Verdie JONETTA Collier, RN

## 2024-06-19 NOTE — Op Note (Signed)
 Preoperative diagnosis: Gross hematuria  Postoperative diagnosis: Gross hematuria meatal stenosis  Procedure performed: Cystoscopy clot EVAC, urethral dilation complex catheter, placement over wire, bladder calculus evacuation  Operative findings: Severe clot burden noted in the bladder all clot evacuated all bleeding was noted from the median lobe We were not able to cauterize because a wire was not able to produce a functioning resectoscope cord  Anesthesia: MAC  Antibiotics: Cefepime   Fluids: Per anesthesia  EBL: 30 cc  Specimens: None  Drains: 22 French three-way hematuria catheter 30 cc in the balloon  Indications procedure: Mr. [name] is an 83 year old male with a history of urinary retention resulting in acute kidney failure he had a catheter placed about a week ago he was discharged home and fell and started developing gross hematuria he has since been in clot retention they have been unable to keep his catheter from clotting on CBI so he was taken to the OR today for cystoscopy clot EVAC.  Procedure in detail: After informed consent patient is taken the operative suite after placed under MAC anesthesia the patient was prepped and draped in usual sterile fashion Performed verifying correct patient procedure we did not place the 26 French resectoscope this was unable to so using [employer] sounds we will to dilate the urethra to 30 French and the 26 were French resectoscope was advanced into the bladder we noted significant clot this was irrigated every now and the clot there was also multiple stones that were evacuated as well.  All the bleeding was noted from the median lobe of the prostate at this point we tried to fulgurate but the cord appeared to be broken we asked for a second cord to be brought up the OR could not produce a second resectoscope cord at this point we had no other option then to abort the case and placed the patient on CBI.   This point we attempted to  place a 22 French hematuria catheter into the bladder but this is unable to place so I placed the scope back and I noted that a false passage to be created near the prostatic urethra.  So the 1 scope was placed in the bladder all the blood was evacuated again and a sensor wires placed and the scope scope was removed leaving the wire in place the 22 French hematuria catheter was then placed over the wire and placed in the bladder 30 cc were inflated balloon placed was placed on CBI then placed on traction.  Disposition: Patient should be able to eat tonight will likely make n.p.o. at midnight again in case patient is to go back to the OR tomorrow.  Or be transferred to [facility].

## 2024-06-19 NOTE — Progress Notes (Addendum)
 Patient ID: Drew Adams, male   DOB: 1941-05-07, 83 y.o.   MRN: [id number] Upon morning assessment, CBI disconnected and draining into bed. Urology and Attending notified. Reconnected and irrigated for clots. Verdie JONETTA Collier, RN

## 2024-06-20 ENCOUNTER — Encounter (HOSPITAL_COMMUNITY): Payer: Self-pay | Admitting: Urology

## 2024-06-20 DIAGNOSIS — N179 Acute kidney failure, unspecified: Secondary | ICD-10-CM | POA: Diagnosis not present

## 2024-06-20 DIAGNOSIS — R296 Repeated falls: Secondary | ICD-10-CM | POA: Diagnosis not present

## 2024-06-20 DIAGNOSIS — R310 Gross hematuria: Secondary | ICD-10-CM | POA: Diagnosis not present

## 2024-06-20 LAB — BPAM RBC
Blood Product Expiration Date: 202512122359
Blood Product Expiration Date: 202512122359
ISSUE DATE / TIME: 202511231331
ISSUE DATE / TIME: 202511241401
Unit Type and Rh: 6200
Unit Type and Rh: 6200

## 2024-06-20 LAB — CBC
HCT: 23.1 % — ABNORMAL LOW (ref 39.0–52.0)
Hemoglobin: 7.4 g/dL — ABNORMAL LOW (ref 13.0–17.0)
MCH: 30.5 pg (ref 26.0–34.0)
MCHC: 32 g/dL (ref 30.0–36.0)
MCV: 95.1 fL (ref 80.0–100.0)
Platelets: 114 K/uL — ABNORMAL LOW (ref 150–400)
RBC: 2.43 MIL/uL — ABNORMAL LOW (ref 4.22–5.81)
RDW: 17 % — ABNORMAL HIGH (ref 11.5–15.5)
WBC: 10.7 K/uL — ABNORMAL HIGH (ref 4.0–10.5)
nRBC: 0 % (ref 0.0–0.2)

## 2024-06-20 LAB — TYPE AND SCREEN
ABO/RH(D): A POS
Antibody Screen: NEGATIVE
Unit division: 0
Unit division: 0

## 2024-06-20 LAB — HEMOGLOBIN AND HEMATOCRIT, BLOOD
HCT: 26.3 % — ABNORMAL LOW (ref 39.0–52.0)
Hemoglobin: 8.6 g/dL — ABNORMAL LOW (ref 13.0–17.0)

## 2024-06-20 LAB — BASIC METABOLIC PANEL WITH GFR
Anion gap: 11 (ref 5–15)
BUN: 40 mg/dL — ABNORMAL HIGH (ref 8–23)
CO2: 16 mmol/L — ABNORMAL LOW (ref 22–32)
Calcium: 8.3 mg/dL — ABNORMAL LOW (ref 8.9–10.3)
Chloride: 114 mmol/L — ABNORMAL HIGH (ref 98–111)
Creatinine, Ser: 1.83 mg/dL — ABNORMAL HIGH (ref 0.61–1.24)
GFR, Estimated: 37 mL/min — ABNORMAL LOW (ref >60)
Glucose, Bld: 126 mg/dL — ABNORMAL HIGH (ref 70–99)
Potassium: 3.6 mmol/L (ref 3.5–5.1)
Sodium: 141 mmol/L (ref 135–145)

## 2024-06-20 NOTE — TOC Progression Note (Signed)
 Transition of Care [facility]) - Progression Note    Patient Details  Name: Drew Adams MRN: [id number] Date of Birth: [birth year]  Transition of Care [facility]) CM/SW Contact  Montie LOISE Louder, [place] Phone Number: 06/20/2024, 3:57 PM  Clinical Narrative:     Patient's recommendations has been changed to SNF.  CM advised family is agreeable to SNF.   TOC will provide bed offers once available.   Montie Louder, MSW, LCSW Clinical Social Worker    Expected Discharge Plan: Skilled Nursing Facility Barriers to Discharge: Continued Medical Work up               Expected Discharge Plan and Services In-house Referral: Clinical Social Work Discharge Planning Services: CM Consult Post Acute Care Choice: Home Health, Resumption of Svcs/PTA Provider, Skilled Nursing Facility Living arrangements for the past 2 months: Single Family Home                 DME Arranged: N/A DME Agency: NA       HH Arranged: PT, OT HH Agency: [facility]         Social Drivers of Health (SDOH) Interventions SDOH Screenings   Food Insecurity: No Food Insecurity (06/17/2024)  Housing: Low Risk  (06/17/2024)  Transportation Needs: No Transportation Needs (06/17/2024)  Utilities: Not At Risk (06/17/2024)  Social Connections: Socially Isolated (06/17/2024)  Tobacco Use: Low Risk  (06/19/2024)    Readmission Risk Interventions     No data to display

## 2024-06-20 NOTE — Progress Notes (Signed)
 Physical Therapy Treatment Patient Details Name: Drew Adams MRN: [id number] DOB: [birth year] Today's Date: 06/20/2024   History of Present Illness 83 y.o. male admitted 06/17/24 after two falls at home. First fall was controlled by family and lowered to the floor without injury.  Had subsequent fall early this morning with head injury. Noted to be orthostatic with EMS. Pt sustained an S-shaped 4cm laceration to top of scalp, no active bleeding, secondary wounds to left parietal scalp. Foley in place, dried blood present around meatus and along foley tubing, some blood collected in foley bag. Rapid Response called 11/23 d/t hypotension (78/38), fluid bolus ordered. PMHx: aortic stenosis s/p TAVR (05/30/2024), CAD, CKD 3B, dementia, urinary retention, BPH, and prior TIA. Of note, recent admission 11/16-11/20 for AKI and urinary retention.    PT Comments  Pt received in supine, agreeable to therapy session with encouragement, daughter present also and supportive/encouraging. Pt needing up to minA +2 for transfers from EOB with HHA, sidesteps toward [facility] [place] and bed mobility when fatigued and due to lines/pain from bladder irrigation. Standing tolerance limited due to symptomatic drop in BP and pain from foley/irrigation. Worked on seated BLE exercises and discussion on benefits of reciprocal STS exercise for BLE strengthening when BP unstable in standing. Pt would benefit from BLE compression socks to see if this improves hemodynamic stability for further standing activities during PT sessions. Patient will benefit from continued inpatient follow up therapy, <3 hours/day, discussed with supervising PT Delon SAUNDERS, family in agreement with update. Pt reports at baseline he is independent and runs long distances within the past couple weeks was at this level, currently he is needing +2 for standing and simulated pivot transfers, and well below typical baseline.   If plan is discharge home, recommend the following: A  lot of help with walking and/or transfers;A lot of help with bathing/dressing/bathroom;Assistance with cooking/housework;Assist for transportation;Help with stairs or ramp for entrance   Can travel by private vehicle     Yes  Equipment Recommendations  Rolling walker (2 wheels) (may need WC depending on progress with BP and orthostatic hypotension)    Recommendations for Other Services       Precautions / Restrictions Precautions Precautions: Fall Recall of Precautions/Restrictions: Impaired Precaution/Restrictions Comments: Watch BP Restrictions Weight Bearing Restrictions Per Provider Order: No     Mobility  Bed Mobility Overal bed mobility: Needs Assistance Bed Mobility: Supine to Sit, Sit to Supine     Supine to sit: Min assist, Used rails Sit to supine: Min assist, +2 for physical assistance, +2 for safety/equipment, Used rails   General bed mobility comments: Cues for self-assist; from flat bed per home set-up, increased time/efffort to perform, pt c/o discomfort due to continuous bladder irrigation. BLE and trunk support returning to supine as pt c/o fatigue and increased pain from bladder irrigation set-up.    Transfers Overall transfer level: Needs assistance Equipment used: 2 person hand held assist Transfers: Sit to/from Stand, Bed to chair/wheelchair/BSC Sit to Stand: Min assist, +2 safety/equipment, +2 physical assistance   Step pivot transfers: Min assist, +2 physical assistance       General transfer comment: Pt stood from bed with +2 HHA and stayed standing from orthostatic BP assessment; pt c/o pain in penis from bladder irrigation and only tolerates side steps toward [facility] prior to returning to sit, then another STS from EOB with HHA prior to requesting to return to supine.    Ambulation/Gait  General Gait Details: Defer due to MAP (59) standing and continuous bladder irrigation/pt pain and fatigue limiting standing tolerance. Pt took  a  few sidesteps toward [facility] with BUE support only   Optometrist     Tilt Bed    Modified Rankin (Stroke Patients Only)       Balance Overall balance assessment: Needs assistance Sitting-balance support: Bilateral upper extremity supported, Feet supported Sitting balance-Leahy Scale: Fair Sitting balance - Comments: Pt sat EOB with close supervision.   Standing balance support: Bilateral upper extremity supported, During functional activity, Reliant on assistive device for balance Standing balance-Leahy Scale: Poor Standing balance comment: BUE support in standing, some bracing of limbs against bed frame PRN                            Communication Communication Communication: No apparent difficulties  Cognition Arousal: Alert Behavior During Therapy: WFL for tasks assessed/performed   PT - Cognitive impairments: History of cognitive impairments (dementia)                       PT - Cognition Comments: very active at baseline, pt needs min cues for attention to task/plan for session and pt frequently states what's next? Following commands: Intact      Cueing Cueing Techniques: Verbal cues, Gestural cues, Tactile cues  Exercises Other Exercises Other Exercises: seated BLE AROM: LAQ x15 reps ea Other Exercises: had planned to have him perform seated marches but due to bladder irrigation system, pt c/o pain and defers at this time. STS x 2 reps for BLE strengthening, wanted to have him perform 5 reps but pt too fatigued/too much pain to perform all 5. Other Exercises: ankle pumps x10 reps supine AROM    General Comments General comments (skin integrity, edema, etc.): PTA encouraged him to maintain bed in chair posture a few times a day if tolerated, to build tolerance for upright posture, RN notified pt has SCD sleeves but missing machine for SCD to function, RN brought to room during session. Pt heels floated, daughter  in room throughout and supportive.      Pertinent Vitals/Pain Pain Assessment Pain Assessment: Faces Faces Pain Scale: Hurts even more Pain Location: penis from irrigation Pain Descriptors / Indicators: Discomfort, Grimacing, Moaning, Sharp, Shooting, Guarding Pain Intervention(s): Limited activity within patient's tolerance, Monitored during session, Premedicated before session, Repositioned    Home Living                          Prior Function            PT Goals (current goals can now be found in the care plan section) Acute Rehab PT Goals Patient Stated Goal: To go to rehab and get stronger before home, I was running and independent just a couple weeks ago. PT Goal Formulation: With patient/family Time For Goal Achievement: 07/02/24 Progress towards PT goals: Progressing toward goals    Frequency    Min 2X/week      PT Plan      Co-evaluation              AM-PAC PT 6 Clicks Mobility   Outcome Measure  Help needed turning from your back to your side while in a flat bed without using bedrails?: A Little Help needed moving from lying on your  back to sitting on the side of a flat bed without using bedrails?: A Lot (w/o rail when fatigued) Help needed moving to and from a bed to a chair (including a wheelchair)?: A Lot Help needed standing up from a chair using your arms (e.g., wheelchair or bedside chair)?: A Lot Help needed to walk in hospital room?: Total Help needed climbing 3-5 steps with a railing? : Total 6 Click Score: 11    End of Session Equipment Utilized During Treatment: Gait belt Activity Tolerance: Patient tolerated treatment well;Patient limited by pain;Treatment limited secondary to medical complications (Comment);Other (comment) (symptomatic drop in BP and discomfort due to continuous bladder irrigation) Patient left: in bed;with call bell/phone within reach;with bed alarm set;Other (comment);with SCD's reapplied (heels  floated) Nurse Communication: Mobility status;Other (comment) (foley has ~966mL, needs SCD machine turned on (has sleeves), BP readings in flowsheet) PT Visit Diagnosis: Unsteadiness on feet (R26.81);Other abnormalities of gait and mobility (R26.89);Muscle weakness (generalized) (M62.81)     Time: 8394-8368 PT Time Calculation (min) (ACUTE ONLY): 26 min  Charges:    $Therapeutic Exercise: 8-22 mins $Therapeutic Activity: 8-22 mins PT General Charges $$ ACUTE PT VISIT: 1 Visit                     Tyyne Cliett P., PTA Acute Rehabilitation Services Secure Chat Preferred 9a-5:30pm Office: [phone]    Connell HERO [facility] 06/20/2024, 5:27 PM

## 2024-06-20 NOTE — Progress Notes (Signed)
 Patient has been NPO for 2 days for possible procedure. Paged on call Bell, MD at [phone] for an updated status.  Awad Gladd L Javin Nong, RN

## 2024-06-20 NOTE — Progress Notes (Signed)
   06/20/24 1001  SLP Visit Information  SLP Received On 06/20/24  General Information  HPI Drew Adams is a 83 y.o. male with a history of aortic stenosis s/p TAVR, hyperlipidemia, CAD, CKD, BPH, urinary retention.  Patient presented secondary to a fall, suffering a scalp laceration with evidence of UTI and gross hematuria with resultant anemia requiring blood transfusion.  Prior Functional Status  Cognitive/Linguistic Baseline Baseline deficits (memory per daughter)  Type of Home House  Available Help at Discharge Family;Available PRN/intermittently  Vocation Retired  Land Function  Overall Oral Motor/Sensory Function WFL  Cognition  Overall Cognitive Status Impaired/Different from baseline  Arousal/Alertness Awake/alert  Orientation Level Oriented to person;Oriented to place;Oriented to situation;Oriented to time  Attention Focused;Sustained;Selective;Alternating  Focused Attention Appears intact  Sustained Attention Appears intact  Selective Attention Appears intact  Alternating Attention Appears intact  Memory Impaired  Memory Impairment Storage deficit;Retrieval deficit;Decreased short term memory;Decreased recall of new information  Decreased Short Term Memory Verbal complex  Awareness Appears intact  Problem Solving Impaired  Problem Solving Impairment Verbal complex  Safety/Judgment Impaired (recent falls)  Auditory Comprehension  Overall Auditory Comprehension Appears within functional limits for tasks assessed  Verbal Expression  Overall Verbal Expression Appears within functional limits for tasks assessed  Motor Speech  Overall Motor Speech Appears within functional limits for tasks assessed  Assessment  Clinical Impression Statement (ACUTE ONLY) SLP briefly assessed pts cognition in the setting of acute illness after a fall. Pt suffered a scalp hematoma but no evidence of concussion. Pts speech, language and affect all appropriate, attentive throughout  session and able to complete SLUMS Exam. Pt scored a 16/30 indicative of significant cognitive decline. Pts primary impairments were related to working memory with auditory inputs. Pt did well with visual spatial tasks and was able to correct errors when cues were given. SLP reviewed orientation and safety awareness strategies and briefly introduced memory compensations for home. Pt would benefit from f/u with SLP at next level of care for further memory and safety awareness training. No further interventions needed in acute level of care.  SLP Recommendation/Assessment All further Speech Language Pathology needs can be addressed in the next venue of care  SLP Visit Diagnosis Cognitive communication deficit (R41.841)  SLP Recommendations  Follow Up Recommendations Outpatient SLP  Individuals Consulted  Consulted and Agree with Results and Recommendations Patient;Family member/caregiver  SLP Evaluations  $ SLP Speech Visit 1 Visit  SLP Evaluations  $ SLP EVAL LANGUAGE/SOUND PRODUCTION 1 Procedure

## 2024-06-20 NOTE — Progress Notes (Addendum)
 Patient ID: Drew Adams, male   DOB: Dec 07, 1940, 83 y.o.   MRN: [id number]  1 Day Post-Op Subjective: Pt resting comfortably this morning.  Events from last night noted and I spoke with Dr. Shane.  There was noted to be bleeding from the median lobe of the prostate.  Unfortunately, there was insufficient equipment available in the [facility] OR to allow optimal cauterization of this area.  All clot evacuated from bladder and catheter placed on traction.  Objective: Vital signs in last 24 hours: Temp:  [97.4 F (36.3 C)-98 F (36.7 C)] 97.6 F (36.4 C) (11/25 0316) Pulse Rate:  [77-91] 79 (11/25 0316) Resp:  [12-19] 14 (11/25 0316) BP: (102-134)/(45-62) 127/55 (11/25 0316) SpO2:  [97 %-100 %] 100 % (11/25 0316)  Intake/Output from previous day: 11/24 0701 - 11/25 0700 In: 21207.8 [P.O.:200; I.V.:1457.8; IV Piggyback:650] Out: 73549 [Urine:26450] Intake/Output this shift: No intake/output data recorded.  Physical Exam:  General: Alert and oriented GU: Urine mostly clear.  Adjusted CBI to moderate drip.  Lab Results: Recent Labs    06/18/24 1938 06/19/24 0811 06/20/24 0338  HGB 7.8* 6.9* 7.4*  HCT 24.0* 20.7* 23.1*   BMET Recent Labs    06/19/24 0811 06/20/24 0338  NA 142 141  K 4.3 3.6  CL 114* 114*  CO2 17* 16*  GLUCOSE 75 126*  BUN 44* 40*  CREATININE 2.33* 1.83*  CALCIUM  8.0* 8.3*     Studies/Results: CT CYSTOGRAM PELVIS Result Date: 06/19/2024 CLINICAL DATA:  Abdominal abscess.  Pain. EXAM: CT CYSTOGRAM (CT PELVIS WITH CONTRAST) TECHNIQUE: Multidetector CT imaging through the pelvis was performed after dilute contrast had been introduced into the bladder for the purposes of performing CT cystography. RADIATION DOSE REDUCTION: This exam was performed according to the departmental dose-optimization program which includes automated exposure control, adjustment of the mA and/or kV according to patient size and/or use of iterative reconstruction technique.  CONTRAST:  40 cc Omnipaque  300 COMPARISON:  CT abdomen pelvis dated 06/18/2024. FINDINGS: Urinary Tract: Large amount of high attenuating content noted within the urinary bladder most consistent with blood products/clot. Multiple calcifications within the bladder and associated with the blood clot measure up to 1 cm. There is a Foley catheter within the urinary bladder. Small amount of air within the bladder introduced via the catheter. There is mild trabeculated appearance of the bladder wall suggestive of chronic bladder outlet obstruction. No extraluminal contrast. Bowel:  Unremarkable visualized pelvic bowel loops. Vascular/Lymphatic: Moderate atherosclerotic calcification of the visualized aorta and iliac arteries. No pelvic adenopathy. Reproductive: Enlarged prostate gland measuring 6.2 cm in transverse axial diameter. Other:  None Musculoskeletal: Osteopenia with degenerative changes. No acute osseous pathology. IMPRESSION: 1. Large amount of blood products/clot within the urinary bladder. No extraluminal contrast. 2. Enlarged prostate gland with findings of chronic bladder outlet obstruction. 3.  Aortic Atherosclerosis (ICD10-I70.0). Electronically Signed   By: Vanetta Chou M.D.   On: 06/19/2024 13:45   US  RENAL Result Date: 06/18/2024 EXAM: US  Retroperitoneum Complete, Renal. 06/18/2024 11:26:22 PM TECHNIQUE: Real-time ultrasonography of the retroperitoneum renal was performed. COMPARISON: None available CLINICAL HISTORY: Acute kidney injury. FINDINGS: FINDINGS: RIGHT KIDNEY/URETER: Right kidney measures 8.6 x 6.1 x 6.6 cm. Normal cortical echogenicity. Lower pole poorly visualized due to overlying bowel gas. LEFT KIDNEY/URETER: Left kidney measures 10.3 x 5.9 x 5.7 cm. Normal cortical echogenicity. A somewhat lobulated cystic lesion is noted in the lower pole of the left kidney, measuring 3.6x3.0x3.1 cm. It is difficult to assess whether this represents a  mildly complex cyst or two adjacent simple  cysts. BLADDER: Unremarkable appearance of the bladder. IMPRESSION: 1. No hydronephrosis is noted. 2. Cystic lesion as described in the lower pole of the left kidney. It is difficult to assess whether this represents two adjacent cysts or one larger somewhat lobulated cystic lesion. The former is favored given the appearance on prior CT from 04/11/2024. A follow up ultrasound in 6 months would be helpful to assess for stability. Electronically signed by: Oneil Devonshire MD 06/18/2024 11:39 PM EST RP Workstation: [id number]    Assessment/Plan: 1) Hematuria: S/P cystoscopy and clot evacuation 11/24.  Now titrating CBI down.  Hope this will resolve.  If there is continued bleeding, patient will benefit from transfer to [facility] hospital for optimal urologic OR care.  Renal function improving again.  Continue to monitor serial H/H. 2) Urinary retention:  Continue catheter.  Will consider voiding trial in future once hematuria resolved and renal function stabilized.  Continue alpha blocker therapy.   LOS: 2 days   Noretta Ferrara 06/20/2024, 8:03 AM

## 2024-06-20 NOTE — Progress Notes (Signed)
 PROGRESS NOTE    Drew Adams  FMW:[id number] DOB: Nov 27, 1940 DOA: 06/17/2024 PCP: Stacia Millman, PA   Brief Narrative: Drew Adams is a 83 y.o. male with a history of aortic stenosis s/p TAVR, hyperlipidemia, CAD, CKD, BPH, urinary retention.  Patient presented secondary to a fall, suffering a scalp laceration with evidence of UTI and gross hematuria with resultant anemia requiring blood transfusion. Antibiotics started. Urology consulted. Hematuria catheter placed and CBI started however patient developed continued hematuria and recurrent obstruction from clots. Patient went for cystoscopy with clot evacuation on 11/24.   Assessment and Plan:  Fall Unclear etiology but could possibly related to orthostatic hypotension. Patient with evidence of hypotension overnight while admitted. Likely contributed to by acute anemia from gross hematuria. PT/OT recommending home health therapy on discharge. -SLP consultation for cognitive evaluation  Head laceration Secondary to fall. CT head/cervical spine unremarkable for acute process. Laceration sutured by EDP; 4 sutures placed on 11/22. -Remove sutures in 7-10 days from placement.  Leukocytosis Patient did not meet SIRS criteria on admission. Associated lactic acidosis. Likely related to underlying urinary tract infection and possible associated dehydration. WBC trended down significantly. -CBC daily  Hypotension Likely related to acute anemia, rather than infectious etiology. Resolved with IV fluids.  Acute urinary retention BPH Patient with recent foley catheter placement for management. Catheter replaced this admission.  -Continue foley catheter -Continue tamsulosin   Gross hematuria Unclear etiology. Possibly related to cystitis versus trauma in setting of underlying BPH and foley placement. Complicated by home Aspirin , in addition to subcutaneous heparin  started this admission for VTE prophylaxis. Patient with associated anemia  requiring blood transfusion. Foley catheter in place on admission. Urology consulted and converted patient to a hematuria catheter, starting CBI. Urine continued to show evidence of hematuria. Urology took patient to the OR for cystoscopy and clot evacuation on 11/24. -Urology recommendations: continue CBI  Acute blood loss anemia Secondary to gross hematuria. Hemoglobin of 10.1 on admission, although more recent baseline appears to be between 8-9. Patient has required 2 units of PRBC via transfusion to date. -CBC daily -H&H daily in the evening  History of cystitis UTI Presumed hemorrhagic cystitis. Patient treated with antibiotics during prior hospitalization. Cefepime  started this admission. Urine culture obtained. -Continue Cefepime  -Follow-up urine culture  AKI on CKD stage 3a Baseline creatinine appear to be around 1.3 from earlier this year, prior to recent urologic issues. Creatinine of 2.26 on admission and has trended up to a peak of 2.42. Concern this is related to obstructive cause, as he had this issue on his most recent admission. Renal ultrasound significant for evidence of no hydronephrosis (post-foley exchange). Creatinine trending down now. Urine output is complicated by CBI documentation. -Trend BMP  Thrombocytopenia Likely related to acute blood loss. -Trend with CBC  Left renal cyst Noted on imaging. Recommendation for follow-up ultrasound in 6 months.  History of TAVR Noted.  CAD Noted. No chest pain. -Hold aspirin  pending urology recommendations  Pressure injury Present on admission. Right buttocks.   DVT prophylaxis: SCDs Code Status:   Code Status: Full Code Family Communication: None at bedside. Disposition Plan: Discharge pending stable hemoglobin, urology recommendations, stable renal function   Consultants:  Urology  Procedures:  Cystoscopy clot EVAC, urethral dilation complex catheter, placement over wire, bladder calculus evacuation    Antimicrobials: Cefepime     Subjective: No concerns this morning. Urine bag with very light pink/clear urine  Objective: BP 115/60   Pulse 83   Temp (!) 97.5 F (36.4 C) (  Axillary)   Resp 15   Ht 5' 10 (1.778 m)   Wt 81.6 kg   SpO2 99%   BMI 25.83 kg/m   Examination:  General exam: Appears calm and comfortable. Respiratory system: Clear to auscultation. Respiratory effort normal. Cardiovascular system: S1 & S2 heard, RRR. Gastrointestinal system: Abdomen is nondistended, soft and nontender. Normal bowel sounds heard. Central nervous system: Alert and oriented. No focal neurological deficits. Musculoskeletal: No edema. No calf tenderness Psychiatry: Judgement and insight appear normal. Mood & affect appropriate.    Data Reviewed: I have personally reviewed following labs and imaging studies  CBC Lab Results  Component Value Date   WBC 10.7 (H) 06/20/2024   RBC 2.43 (L) 06/20/2024   HGB 7.4 (L) 06/20/2024   HCT 23.1 (L) 06/20/2024   MCV 95.1 06/20/2024   MCH 30.5 06/20/2024   PLT 114 (L) 06/20/2024   MCHC 32.0 06/20/2024   RDW 17.0 (H) 06/20/2024   LYMPHSABS 1.1 06/17/2024   MONOABS 1.6 (H) 06/17/2024   EOSABS 0.1 06/17/2024   BASOSABS 0.1 06/17/2024     Last metabolic panel Lab Results  Component Value Date   NA 141 06/20/2024   K 3.6 06/20/2024   CL 114 (H) 06/20/2024   CO2 16 (L) 06/20/2024   BUN 40 (H) 06/20/2024   CREATININE 1.83 (H) 06/20/2024   GLUCOSE 126 (H) 06/20/2024   GFRNONAA 37 (L) 06/20/2024   GFRAA 65 (L) 12/30/2012   CALCIUM  8.3 (L) 06/20/2024   PHOS 3.6 06/14/2024   PROT 5.5 (L) 06/17/2024   ALBUMIN  2.9 (L) 06/17/2024   BILITOT 1.9 (H) 06/17/2024   ALKPHOS 57 06/17/2024   AST 18 06/17/2024   ALT 7 06/17/2024   ANIONGAP 11 06/20/2024    GFR: Estimated Creatinine Clearance: 32.7 mL/min (A) (by C-G formula based on SCr of 1.83 mg/dL (H)).  Recent Results (from the past 240 hours)  Urine Culture     Status: None    Collection Time: 06/11/24  5:23 PM   Specimen: Urine, Clean Catch  Result Value Ref Range Status   Specimen Description URINE, CLEAN CATCH  Final   Special Requests NONE  Final   Culture   Final    NO GROWTH Performed at [facility], [address], [place], [place] [address]    Report Status 06/12/2024 FINAL  Final  Blood Culture (routine x 2)     Status: None   Collection Time: 06/11/24  8:22 PM   Specimen: BLOOD  Result Value Ref Range Status   Specimen Description BLOOD BLOOD RIGHT HAND  Final   Special Requests   Final    BOTTLES DRAWN AEROBIC AND ANAEROBIC Blood Culture adequate volume   Culture   Final    NO GROWTH 5 DAYS Performed at [facility], [address], [place], [place] [address]    Report Status 06/16/2024 FINAL  Final  Urine Culture     Status: None   Collection Time: 06/11/24  8:23 PM   Specimen: Urine, Clean Catch  Result Value Ref Range Status   Specimen Description URINE, CLEAN CATCH  Final   Special Requests NONE  Final   Culture   Final    NO GROWTH Performed at [facility], [address] [place], [place] [address]    Report Status 06/13/2024 FINAL  Final  Blood Culture (routine x 2)     Status: None   Collection Time: 06/11/24  8:27 PM   Specimen: BLOOD  Result Value Ref Range Status   Specimen Description BLOOD BLOOD RIGHT HAND  Final   Special Requests   Final    BOTTLES DRAWN AEROBIC AND ANAEROBIC Blood Culture results may not be optimal due to an inadequate volume of blood received in culture bottles   Culture   Final    NO GROWTH 5 DAYS Performed at [facility], [address], [place], [place] [address]    Report Status 06/16/2024 FINAL  Final  Resp panel by RT-PCR (RSV, Flu A&B, Covid) Anterior Nasal Swab     Status: None   Collection Time: 06/12/24  8:38 AM   Specimen: Anterior Nasal Swab  Result Value Ref Range Status   SARS Coronavirus 2 by RT PCR NEGATIVE NEGATIVE Final   Influenza A by PCR NEGATIVE  NEGATIVE Final   Influenza B by PCR NEGATIVE NEGATIVE Final    Comment: (NOTE) The Xpert Xpress SARS-CoV-2/FLU/RSV plus assay is intended as an aid in the diagnosis of influenza from Nasopharyngeal swab specimens and should not be used as a sole basis for treatment. Nasal washings and aspirates are unacceptable for Xpert Xpress SARS-CoV-2/FLU/RSV testing.  Fact Sheet for Patients: bloggercourse.com  Fact Sheet for Healthcare Providers: seriousbroker.it  This test is not yet approved or cleared by the [place]  FDA and has been authorized for detection and/or diagnosis of SARS-CoV-2 by FDA under an Emergency Use Authorization (EUA). This EUA will remain in effect (meaning this test can be used) for the duration of the COVID-19 declaration under Section 564(b)(1) of the Act, 21 U.S.C. section 360bbb-3(b)(1), unless the authorization is terminated or revoked.     Resp Syncytial Virus by PCR NEGATIVE NEGATIVE Final    Comment: (NOTE) Fact Sheet for Patients: bloggercourse.com  Fact Sheet for Healthcare Providers: seriousbroker.it  This test is not yet approved or cleared by the [place]  FDA and has been authorized for detection and/or diagnosis of SARS-CoV-2 by FDA under an Emergency Use Authorization (EUA). This EUA will remain in effect (meaning this test can be used) for the duration of the COVID-19 declaration under Section 564(b)(1) of the Act, 21 U.S.C. section 360bbb-3(b)(1), unless the authorization is terminated or revoked.  Performed at [facility], [address] [place], [place] [address]   Blood culture (routine x 2)     Status: None (Preliminary result)   Collection Time: 06/17/24  4:53 AM   Specimen: BLOOD RIGHT HAND  Result Value Ref Range Status   Specimen Description BLOOD RIGHT HAND  Final   Special Requests   Final    BOTTLES DRAWN AEROBIC  ONLY Blood Culture results may not be optimal due to an inadequate volume of blood received in culture bottles   Culture   Final    NO GROWTH 3 DAYS Performed at [facility], [address] [place], [place] [address]    Report Status PENDING  Incomplete  Blood culture (routine x 2)     Status: None (Preliminary result)   Collection Time: 06/17/24  6:15 AM   Specimen: BLOOD LEFT ARM  Result Value Ref Range Status   Specimen Description BLOOD LEFT ARM  Final   Special Requests   Final    BOTTLES DRAWN AEROBIC AND ANAEROBIC Blood Culture results may not be optimal due to an inadequate volume of blood received in culture bottles   Culture   Final    NO GROWTH 3 DAYS Performed at [facility], [address]  [place], [place] [address]    Report Status PENDING  Incomplete  Urine Culture     Status: None   Collection Time: 06/17/24  8:55 AM   Specimen: Urine, Clean Catch  Result Value Ref Range Status   Specimen Description URINE, CLEAN CATCH  Final   Special Requests NONE  Final   Culture   Final    NO GROWTH Performed at [facility], [address], [place], [place] [address]    Report Status 06/19/2024 FINAL  Final      Radiology Studies: CT CYSTOGRAM PELVIS Result Date: 06/19/2024 CLINICAL DATA:  Abdominal abscess.  Pain. EXAM: CT CYSTOGRAM (CT PELVIS WITH CONTRAST) TECHNIQUE: Multidetector CT imaging through the pelvis was performed after dilute contrast had been introduced into the bladder for the purposes of performing CT cystography. RADIATION DOSE REDUCTION: This exam was performed according to the departmental dose-optimization program which includes automated exposure control, adjustment of the mA and/or kV according to patient size and/or use of iterative reconstruction technique. CONTRAST:  40 cc Omnipaque  300 COMPARISON:  CT abdomen pelvis dated 06/18/2024. FINDINGS: Urinary Tract: Large amount of high attenuating content noted within the urinary  bladder most consistent with blood products/clot. Multiple calcifications within the bladder and associated with the blood clot measure up to 1 cm. There is a Foley catheter within the urinary bladder. Small amount of air within the bladder introduced via the catheter. There is mild trabeculated appearance of the bladder wall suggestive of chronic bladder outlet obstruction. No extraluminal contrast. Bowel:  Unremarkable visualized pelvic bowel loops. Vascular/Lymphatic: Moderate atherosclerotic calcification of the visualized aorta and iliac arteries. No pelvic adenopathy. Reproductive: Enlarged prostate gland measuring 6.2 cm in transverse axial diameter. Other:  None Musculoskeletal: Osteopenia with degenerative changes. No acute osseous pathology. IMPRESSION: 1. Large amount of blood products/clot within the urinary bladder. No extraluminal contrast. 2. Enlarged prostate gland with findings of chronic bladder outlet obstruction. 3.  Aortic Atherosclerosis (ICD10-I70.0). Electronically Signed   By: Vanetta Chou M.D.   On: 06/19/2024 13:45   US  RENAL Result Date: 06/18/2024 EXAM: US  Retroperitoneum Complete, Renal. 06/18/2024 11:26:22 PM TECHNIQUE: Real-time ultrasonography of the retroperitoneum renal was performed. COMPARISON: None available CLINICAL HISTORY: Acute kidney injury. FINDINGS: FINDINGS: RIGHT KIDNEY/URETER: Right kidney measures 8.6 x 6.1 x 6.6 cm. Normal cortical echogenicity. Lower pole poorly visualized due to overlying bowel gas. LEFT KIDNEY/URETER: Left kidney measures 10.3 x 5.9 x 5.7 cm. Normal cortical echogenicity. A somewhat lobulated cystic lesion is noted in the lower pole of the left kidney, measuring 3.6x3.0x3.1 cm. It is difficult to assess whether this represents a mildly complex cyst or two adjacent simple cysts. BLADDER: Unremarkable appearance of the bladder. IMPRESSION: 1. No hydronephrosis is noted. 2. Cystic lesion as described in the lower pole of the left kidney. It  is difficult to assess whether this represents two adjacent cysts or one larger somewhat lobulated cystic lesion. The former is favored given the appearance on prior CT from 04/11/2024. A follow up ultrasound in 6 months would be helpful to assess for stability. Electronically signed by: Oneil Devonshire MD 06/18/2024 11:39 PM EST RP Workstation: [id number]      LOS: 2 days    Elgin Lam, MD [facility] 06/20/2024, 9:09 AM   If 7PM-7AM, please contact night-coverage [email or web address]

## 2024-06-20 NOTE — Progress Notes (Addendum)
 Received report from and pt brought back to the unit by Geni Pauling RN. CBI continued as ordered.   06/19/24 2315  Vitals  Temp 97.8 F (36.6 C)  Temp Source Axillary  BP 120/62  MAP (mmHg) 79  BP Location Left Arm  BP Method Automatic  Patient Position (if appropriate) Lying  Pulse Rate 77  Pulse Rate Source Monitor  ECG Heart Rate 86  Resp 15  MEWS COLOR  MEWS Score Color Green  MEWS Score  MEWS Temp 0  MEWS Systolic 0  MEWS Pulse 0  MEWS RR 0  MEWS LOC 0  MEWS Score 0

## 2024-06-20 NOTE — TOC Initial Note (Signed)
 Transition of Care (TOC) - Initial/Assessment Note  Rayfield Gobble RN, BSN Inpatient Care Management Unit 4E- RN Case Manager See Treatment Team for direct phone #   Patient Details  Name: Drew Adams MRN: [id number] Date of Birth: 09-30-1940  Transition of [facility]) CM/SW Contact:    Gobble Rayfield Hurst, RN Phone Number: 06/20/2024, 3:29 PM  Clinical Narrative:                 Received msg from bedside RN that daughterGLENWOOD Sober at the bedside.   CM went to room to speak with pt and daughter, Sober also called sister who joined in on speaker phone.   Daughters expressed that they felt pt was sent home too early on last discharge and that he is not able to return home at currently level of function- they both feel pt needs rehab prior to return home to gain strength.  Pt lives in a split level home- that has about 10 steps to enter through garage, about 7 steps total in split via front entrance.  Pt also has 9 steps to go upstairs where the bathroom is- no bathroom on lower level.   On last discharge- pt was set up with [facility] for [facility]- however returned to hospital prior to [facility] initial visit. Family has also arranged for some private duty assistance with [facility]- they feel however at this time pt needs 24/7 care which they feel would be better provided in a SNF/rehab.   Per daughters- pt has RW and BSC that was provided on last discharge.   Daughters requesting that PT/OT re-eval for possible SNF- msg sent to treatment team.   Note that pt was orthostatic with therapy on initial evals.   Explained to daughters that updated recommendations would be needed for rehab before we could start that process- also explained that pt would need bed offers, insurance approval and confirmed bed prior to being able to transition to a rehab.   IP CM and CSW to follow up after PT/OT re-eval for safest transition plan.   Expected Discharge Plan: Skilled Nursing Facility Barriers to Discharge:  Continued Medical Work up   Patient Goals and CMS Choice Patient states their goals for this hospitalization and ongoing recovery are:: wants to get better and stronger CMS Medicare.gov Compare Post Acute Care list provided to:: Patient Choice offered to / list presented to : Adult Children, Patient      Expected Discharge Plan and Services In-house Referral: Clinical Social Work Discharge Planning Services: CM Consult Post Acute Care Choice: Home Health, Resumption of Svcs/PTA Provider, Skilled Nursing Facility Living arrangements for the past 2 months: Single Family Home                 DME Arranged: N/A DME Agency: NA       HH Arranged: PT, OT HH Agency: [facility]        Prior Living Arrangements/Services Living arrangements for the past 2 months: Single Family Home Lives with:: Self, Relatives Patient language and need for interpreter reviewed:: Yes Do you feel safe going back to the place where you live?: Yes      Need for Family Participation in Patient Care: Yes (Comment) Care giver support system in place?: Yes (comment) Current home services: DME (Rolling walker and BSC) Criminal Activity/Legal Involvement Pertinent to Current Situation/Hospitalization: No - Comment as needed  Activities of Daily Living   ADL Screening (condition at time of admission) Independently performs ADLs?: No Does the  patient have a NEW difficulty with bathing/dressing/toileting/self-feeding that is expected to last >3 days?: Yes (Initiates electronic notice to provider for possible OT consult) Does the patient have a NEW difficulty with getting in/out of bed, walking, or climbing stairs that is expected to last >3 days?: Yes (Initiates electronic notice to provider for possible PT consult) Does the patient have a NEW difficulty with communication that is expected to last >3 days?: No Is the patient deaf or have difficulty hearing?: No Does the patient have difficulty seeing, even  when wearing glasses/contacts?: No Does the patient have difficulty concentrating, remembering, or making decisions?: Yes  Permission Sought/Granted Permission sought to share information with : Facility Industrial/product Designer granted to share information with : Yes, Verbal Permission Granted  Share Information with NAME: Adrien     Permission granted to share info w Relationship: daughters     Emotional Assessment Appearance:: Appears stated age Attitude/Demeanor/Rapport: Engaged Affect (typically observed): Calm Orientation: : Oriented to Self, Oriented to Place, Oriented to  Time, Oriented to Situation Alcohol / Substance Use: Not Applicable Psych Involvement: No (comment)  Admission diagnosis:  Falls [R29.6] Recurrent falls [R29.6] Leukocytosis, unspecified type [D72.829] Patient Active Problem List   Diagnosis Date Noted   Falls 06/17/2024   Lactic acidosis 06/17/2024   Renal insufficiency 06/17/2024   Pressure injury of skin 06/17/2024   Hematuria 06/17/2024   History of cystitis 06/17/2024   Normocytic anemia 06/17/2024   Acute kidney injury superimposed on stage 3a chronic kidney disease (HCC) 06/12/2024   Urinary retention due to benign prostatic hyperplasia 06/12/2024   BPH (benign prostatic hyperplasia) 06/12/2024   Acute cystitis 06/12/2024   Leukocytosis 06/12/2024   Hyperkalemia 06/12/2024   High anion gap metabolic acidosis 06/12/2024   Anemia of chronic disease 06/12/2024   Coronary artery disease involving native coronary artery of native heart without angina pectoris 06/06/2024   Chronic heart failure with preserved ejection fraction (HFpEF) (HCC) 06/06/2024   Aortic stenosis, severe 06/06/2024   History of transcatheter aortic valve replacement (TAVR) 05/30/2024   Thoracic aortic aneurysm (TAA)    Hypertension    Mixed hyperlipidemia    Mitral valve disease    Aortic valve disease    TIA (transient ischemic attack) 02/14/2019   CKD  (chronic kidney disease) stage 2, GFR 60-89 ml/min 12/28/2012   Adenomatous polyp of sigmoid colon 11/28/2012   Iron deficiency anemia 11/28/2012   Paraesophageal hiatal hernia 11/28/2012   Diverticulosis of colon without hemorrhage 11/28/2012   Gout    PCP:  Stacia Millman, PA Pharmacy:   [facility] [address], [place] - [address] [place] [address] Phone: [phone] Fax: [phone]  [facility] [place]  - [place], [place] - [address] [place] [address] Phone: [phone] Fax: [phone]  [facility] [address] [place] [address] Phone: [phone] Fax: [phone]     Social Drivers of Health (SDOH) Social History: SDOH Screenings   Food Insecurity: No Food Insecurity (06/17/2024)  Housing: Low Risk  (06/17/2024)  Transportation Needs: No Transportation Needs (06/17/2024)  Utilities: Not At Risk (06/17/2024)  Social Connections: Socially Isolated (06/17/2024)  Tobacco Use: Low Risk  (06/19/2024)   SDOH Interventions:     Readmission Risk Interventions     No data to display

## 2024-06-20 NOTE — [place] FL2 (Signed)
 [place]  MEDICAID FL2 LEVEL OF CARE FORM     IDENTIFICATION  Patient Name: Drew Adams Birthdate: [birth year] Sex: male Admission Date (Current Location): 06/17/2024  [facility] and [place] Number:  Producer, Television/film/video and Address:  [facility] [place], [address], [place], [place] [address]      Provider Number: [id number]  Attending Physician Name and Address:  Briana Elgin LABOR, MD  Relative Name and Phone Number:       Current Level of Care: Hospital Recommended Level of Care: Skilled Nursing Facility Prior Approval Number:    Date Approved/Denied:   PASRR Number: [id number]  Discharge Plan: SNF    Current Diagnoses: Patient Active Problem List   Diagnosis Date Noted   Falls 06/17/2024   Lactic acidosis 06/17/2024   Renal insufficiency 06/17/2024   Pressure injury of skin 06/17/2024   Hematuria 06/17/2024   History of cystitis 06/17/2024   Normocytic anemia 06/17/2024   Acute kidney injury superimposed on stage 3a chronic kidney disease (HCC) 06/12/2024   Urinary retention due to benign prostatic hyperplasia 06/12/2024   BPH (benign prostatic hyperplasia) 06/12/2024   Acute cystitis 06/12/2024   Leukocytosis 06/12/2024   Hyperkalemia 06/12/2024   High anion gap metabolic acidosis 06/12/2024   Anemia of chronic disease 06/12/2024   Coronary artery disease involving native coronary artery of native heart without angina pectoris 06/06/2024   Chronic heart failure with preserved ejection fraction (HFpEF) (HCC) 06/06/2024   Aortic stenosis, severe 06/06/2024   History of transcatheter aortic valve replacement (TAVR) 05/30/2024   Thoracic aortic aneurysm (TAA)    Hypertension    Mixed hyperlipidemia    Mitral valve disease    Aortic valve disease    TIA (transient ischemic attack) 02/14/2019   CKD (chronic kidney disease) stage 2, GFR 60-89 ml/min 12/28/2012   Adenomatous polyp of sigmoid colon 11/28/2012   Iron deficiency anemia 11/28/2012    Paraesophageal hiatal hernia 11/28/2012   Diverticulosis of colon without hemorrhage 11/28/2012   Gout     Orientation RESPIRATION BLADDER Height & Weight     Self, Situation, Place  Normal External catheter, Incontinent Weight: 180 lb (81.6 kg) Height:  5' 10 (177.8 cm)  BEHAVIORAL SYMPTOMS/MOOD NEUROLOGICAL BOWEL NUTRITION STATUS      Continent Diet (please see discharge summary)  AMBULATORY STATUS COMMUNICATION OF NEEDS Skin   Limited Assist Verbally  (Head anterior, pressure injury stage II RT Buttocks)                       Personal Care Assistance Level of Assistance  Bathing, Feeding, Dressing Bathing Assistance: Limited assistance Feeding assistance: Independent Dressing Assistance: Limited assistance     Functional Limitations Info  Sight, Hearing, Speech Sight Info: Adequate Hearing Info: Adequate Speech Info: Adequate    SPECIAL CARE FACTORS FREQUENCY  PT (By licensed PT), OT (By licensed OT)     PT Frequency: 5x per week OT Frequency: 5x per week            Contractures Contractures Info: Not present    Additional Factors Info  Code Status, Allergies Code Status Info: FULL Allergies Info: Crestor  (rosuvastatin ) Low Intolerance Other (See Comments) Leg pain and cramping  Exforge (amlodipine Besylate-valsartan) Low Intolerance Other (See Comments) Myalgia  Simvastatin Low Intolerance Other (See Comments)           Current Medications (06/20/2024):  This is the current hospital active medication list Current Facility-Administered Medications  Medication Dose Route Frequency  Provider Last Rate Last Admin   0.9 %  sodium chloride  infusion   Intravenous Continuous Claudene Reeves A, MD 50 mL/hr at 06/20/24 0050 New Bag at 06/20/24 0050   acetaminophen  (TYLENOL ) tablet 650 mg  650 mg Oral Q6H PRN Smith, Rondell A, MD   650 mg at 06/18/24 0327   Or   acetaminophen  (TYLENOL ) suppository 650 mg  650 mg Rectal Q6H PRN Claudene Reeves LABOR, MD        albuterol  (PROVENTIL ) (2.5 MG/3ML) 0.083% nebulizer solution 2.5 mg  2.5 mg Nebulization Q6H PRN Smith, Rondell A, MD       allopurinol  (ZYLOPRIM ) tablet 300 mg  300 mg Oral Daily Claudene, Rondell A, MD   300 mg at 06/20/24 1124   ceFEPIme  (MAXIPIME ) 2 g in sodium chloride  0.9 % 100 mL IVPB  2 g Intravenous Q24H Smith, Rondell A, MD 200 mL/hr at 06/20/24 0544 2 g at 06/20/24 0544   Chlorhexidine  Gluconate Cloth 2 % PADS 6 each  6 each Topical Daily Claudene Reeves A, MD   6 each at 06/20/24 1125   finasteride  (PROSCAR ) tablet 5 mg  5 mg Oral Daily Smith, Rondell A, MD   5 mg at 06/20/24 1124   HYDROcodone -acetaminophen  (NORCO/VICODIN) 5-325 MG per tablet 1 tablet  1 tablet Oral Q4H PRN Briana Elgin LABOR, MD   1 tablet at 06/19/24 1055   iohexol  (OMNIPAQUE ) 350 MG/ML injection 40 mL  40 mL Per Tube Once PRN Showalter, Victor C, MD       meclizine  (ANTIVERT ) tablet 25 mg  25 mg Oral TID PRN Smith, Rondell A, MD       ondansetron  (ZOFRAN ) tablet 4 mg  4 mg Oral Q6H PRN Claudene Reeves LABOR, MD       Or   ondansetron  (ZOFRAN ) injection 4 mg  4 mg Intravenous Q6H PRN Claudene Reeves A, MD       pantoprazole  (PROTONIX ) EC tablet 40 mg  40 mg Oral Daily Smith, Rondell A, MD   40 mg at 06/20/24 1124   sodium chloride  flush (NS) 0.9 % injection 3 mL  3 mL Intravenous Q12H Smith, Rondell A, MD   3 mL at 06/20/24 1125   tamsulosin  (FLOMAX ) capsule 0.4 mg  0.4 mg Oral Daily Smith, Rondell A, MD   0.4 mg at 06/20/24 1124   vancomycin  (VANCOREADY) IVPB 1500 mg/300 mL  1,500 mg Intravenous Q48H Billy Rocky SAUNDERS, RPH 150 mL/hr at 06/19/24 0910 1,500 mg at 06/19/24 9089     Discharge Medications: Please see discharge summary for a list of discharge medications.  Relevant Imaging Results:  Relevant Lab Results:   Additional Information SSN [id number]  Montie LOISE Louder, LCSW

## 2024-06-21 DIAGNOSIS — R296 Repeated falls: Secondary | ICD-10-CM | POA: Diagnosis not present

## 2024-06-21 DIAGNOSIS — R310 Gross hematuria: Secondary | ICD-10-CM | POA: Diagnosis not present

## 2024-06-21 DIAGNOSIS — N179 Acute kidney failure, unspecified: Secondary | ICD-10-CM | POA: Diagnosis not present

## 2024-06-21 LAB — CBC
HCT: 22.1 % — ABNORMAL LOW (ref 39.0–52.0)
Hemoglobin: 7.2 g/dL — ABNORMAL LOW (ref 13.0–17.0)
MCH: 30.3 pg (ref 26.0–34.0)
MCHC: 32.6 g/dL (ref 30.0–36.0)
MCV: 92.9 fL (ref 80.0–100.0)
Platelets: 117 K/uL — ABNORMAL LOW (ref 150–400)
RBC: 2.38 MIL/uL — ABNORMAL LOW (ref 4.22–5.81)
RDW: 16.8 % — ABNORMAL HIGH (ref 11.5–15.5)
WBC: 13.1 K/uL — ABNORMAL HIGH (ref 4.0–10.5)
nRBC: 0 % (ref 0.0–0.2)

## 2024-06-21 LAB — BASIC METABOLIC PANEL WITH GFR
Anion gap: 9 (ref 5–15)
BUN: 32 mg/dL — ABNORMAL HIGH (ref 8–23)
CO2: 18 mmol/L — ABNORMAL LOW (ref 22–32)
Calcium: 8.2 mg/dL — ABNORMAL LOW (ref 8.9–10.3)
Chloride: 113 mmol/L — ABNORMAL HIGH (ref 98–111)
Creatinine, Ser: 1.93 mg/dL — ABNORMAL HIGH (ref 0.61–1.24)
GFR, Estimated: 34 mL/min — ABNORMAL LOW (ref >60)
Glucose, Bld: 90 mg/dL (ref 70–99)
Potassium: 3.8 mmol/L (ref 3.5–5.1)
Sodium: 140 mmol/L (ref 135–145)

## 2024-06-21 MED ORDER — SODIUM CHLORIDE 0.45 % IV SOLN
INTRAVENOUS | Status: AC
  Filled 2024-06-21: qty 75

## 2024-06-21 NOTE — Progress Notes (Signed)
 Occupational Therapy Treatment Patient Details Name: Drew Adams MRN: [id number] DOB: Oct 17, 1940 Today's Date: 06/21/2024   History of present illness 83 y.o. male admitted 06/17/24 after two falls at home. First fall was controlled by family and lowered to the floor without injury.  Had subsequent fall early this morning with head injury. Noted to be orthostatic with EMS. Pt sustained an S-shaped 4cm laceration to top of scalp, no active bleeding, secondary wounds to left parietal scalp. Foley in place, dried blood present around meatus and along foley tubing, some blood collected in foley bag. Rapid Response called 11/23 d/t hypotension (78/38), fluid bolus ordered. PMHx: aortic stenosis s/p TAVR (05/30/2024), CAD, CKD 3B, dementia, urinary retention, BPH, and prior TIA. Of note, recent admission 11/16-11/20 for AKI and urinary retention.   OT comments  Pt with limited progress towards goals d/t being positive for orthostatics (see additional note for further details). Noted progress with STS and transfer, progressed to min assist to stand and CGA for short step pivot with RW. Notified RN and MD of orthostatics.Updated d/c recs to <3 hours of skilled rehab daily to optimize independence levels. Will continue to follow acutely.       If plan is discharge home, recommend the following:  A little help with walking and/or transfers;A little help with bathing/dressing/bathroom;Assistance with cooking/housework;Assist for transportation;Supervision due to cognitive status   Equipment Recommendations  Other (comment) (Defer to next venue)       Precautions / Restrictions Precautions Precautions: Fall Recall of Precautions/Restrictions: Impaired Precaution/Restrictions Comments: Orthostatic Restrictions Weight Bearing Restrictions Per Provider Order: No       Mobility Bed Mobility Overal bed mobility: Needs Assistance Bed Mobility: Supine to Sit     Supine to sit: Min assist      General bed mobility comments: Min HH assist for trunk    Transfers Overall transfer level: Needs assistance Equipment used: Rolling walker (2 wheels) Transfers: Sit to/from Stand, Bed to chair/wheelchair/BSC Sit to Stand: Min assist     Step pivot transfers: Contact guard assist     General transfer comment: Min assist for stand, once in standing, CGA for short step pivot to recliner     Balance Overall balance assessment: Needs assistance Sitting-balance support: Bilateral upper extremity supported, Feet supported Sitting balance-Leahy Scale: Fair     Standing balance support: Bilateral upper extremity supported, During functional activity, Reliant on assistive device for balance Standing balance-Leahy Scale: Poor         ADL either performed or assessed with clinical judgement   ADL Overall ADL's : Needs assistance/impaired       Toilet Transfer: Minimal assistance;Rolling walker (2 wheels) Toilet Transfer Details (indicate cue type and reason): Min assist to come to stand, limited in distance d/t BP         Functional mobility during ADLs: Minimal assistance;Rolling walker (2 wheels) General ADL Comments: Limited d/t BP    Extremity/Trunk Assessment Upper Extremity Assessment Upper Extremity Assessment: Generalized weakness   Lower Extremity Assessment Lower Extremity Assessment: Defer to PT evaluation        Vision   Vision Assessment?: No apparent visual deficits         Communication Communication Communication: No apparent difficulties   Cognition Arousal: Alert Behavior During Therapy: WFL for tasks assessed/performed Cognition: History of cognitive impairments             OT - Cognition Comments: pmh of dementia. Difficulty with working and doctor, general practice, as well as problem solving  Following commands: Intact        Cueing   Cueing Techniques: Verbal cues, Gestural cues, Tactile cues        General  Comments Pt orthostatic,  but denies any symptoms. RN and MD notified    Pertinent Vitals/ Pain       Pain Assessment Pain Assessment: No/denies pain Pain Intervention(s): Monitored during session   Frequency  Min 2X/week        Progress Toward Goals  OT Goals(current goals can now be found in the care plan section)  Progress towards OT goals: OT to reassess next treatment  Acute Rehab OT Goals Patient Stated Goal: To go for walks OT Goal Formulation: With patient Time For Goal Achievement: 07/02/24 Potential to Achieve Goals: Good ADL Goals Pt Will Perform Grooming: with supervision;standing Pt Will Perform Lower Body Bathing: with modified independence;sit to/from stand Pt Will Perform Upper Body Dressing: with supervision;sitting Pt Will Perform Lower Body Dressing: with supervision;sit to/from stand Pt Will Transfer to Toilet: with supervision;ambulating Pt Will Perform Toileting - Clothing Manipulation and hygiene: with supervision;sitting/lateral leans Additional ADL Goal #1: Pt/family will implement cognitive strategies to pass cognitive assessment (similiar to pill box assessment) with no cues  Plan         AM-PAC OT 6 Clicks Daily Activity     Outcome Measure   Help from another person eating meals?: None Help from another person taking care of personal grooming?: A Little Help from another person toileting, which includes using toliet, bedpan, or urinal?: A Little Help from another person bathing (including washing, rinsing, drying)?: A Little Help from another person to put on and taking off regular upper body clothing?: A Little Help from another person to put on and taking off regular lower body clothing?: A Little 6 Click Score: 19    End of Session Equipment Utilized During Treatment: Gait belt;Rolling walker (2 wheels)  OT Visit Diagnosis: Unsteadiness on feet (R26.81);Muscle weakness (generalized) (M62.81);Other symptoms and signs involving  cognitive function   Activity Tolerance Treatment limited secondary to medical complications (Comment) (Orthostatic)   Patient Left in chair;with call bell/phone within reach;with chair alarm set   Nurse Communication Mobility status        Time: 8477-8449 OT Time Calculation (min): 28 min  Charges: OT General Charges $OT Visit: 1 Visit OT Treatments $Self Care/Home Management : 8-22 mins  Adrianne BROCKS, OT  Acute Rehabilitation Services Office [phone] Secure chat preferred   Adrianne GORMAN Savers 06/21/2024, 4:18 PM

## 2024-06-21 NOTE — Progress Notes (Signed)
 Patient ID: Drew Adams, male   DOB: Mar 31, 1941, 83 y.o.   MRN: [id number]  2 Days Post-Op Subjective: Pt has had CBI gradually titrated down over the past 24 hrs and now just on a very slow drip with clear urine in catheter tubing.  Pt adamantly wants his catheter out.  Objective: Vital signs in last 24 hours: Temp:  [97.5 F (36.4 C)-98.6 F (37 C)] 98.6 F (37 C) (11/25 2338) Pulse Rate:  [83-99] 83 (11/26 0354) Resp:  [15-18] 18 (11/26 0354) BP: (106-115)/(53-62) 106/56 (11/26 0354) SpO2:  [99 %-100 %] 100 % (11/26 0354)  Intake/Output from previous day: 11/25 0701 - 11/26 0700 In: 5943 [P.O.:240; I.V.:3] Out: 88874 [Urine:11125] Intake/Output this shift: Total I/O In: 3 [I.V.:3] Out: 3300 [Urine:3300]  Physical Exam:  General: Alert and oriented GU: Urine clear even with CBI held this morning.  Lab Results: Recent Labs    06/20/24 0338 06/20/24 1832 06/21/24 0307  HGB 7.4* 8.6* 7.2*  HCT 23.1* 26.3* 22.1*   BMET Recent Labs    06/20/24 0338 06/21/24 0307  NA 141 140  K 3.6 3.8  CL 114* 113*  CO2 16* 18*  GLUCOSE 126* 90  BUN 40* 32*  CREATININE 1.83* 1.93*  CALCIUM  8.3* 8.2*     Studies/Results:   Assessment/Plan: 1) Hematuria: Improved.  Although Hgb is down again this morning, do not suspect acute bleeding from urinary source based on appearance of urine this morning.  Plan to hold CBI today.  If not requiring CBI over the next 24 hrs, may consider a voiding trial with very close monitoring of renal function and PVRs considering recent retention and acute renal failure due to retention.   He should continue tamsulosin  and finasteride .  I have explained this to patient and he expresses understanding although remains adamant about getting the catheter out soon.  Balancing risks and benefits, it would probably be advisable to continue to hold ASA until catheter is out and he is voiding well without significant hematuria.   LOS: 3 days   Noretta Ferrara 06/21/2024, 6:50 AM

## 2024-06-21 NOTE — Anesthesia Postprocedure Evaluation (Signed)
 Anesthesia Post Note  Patient: Drew Adams  Procedure(s) Performed: CYSTOSCOPY, WITH BLADDER FULGURATION     Patient location during evaluation: PACU Anesthesia Type: MAC Level of consciousness: awake and alert Pain management: pain level controlled Vital Signs Assessment: post-procedure vital signs reviewed and stable Respiratory status: spontaneous breathing, nonlabored ventilation, respiratory function stable and patient connected to nasal cannula oxygen Cardiovascular status: stable and blood pressure returned to baseline Postop Assessment: no apparent nausea or vomiting Anesthetic complications: no   No notable events documented.  Last Vitals:  Vitals:   06/20/24 2338 06/21/24 0354  BP: (!) 106/53 (!) 106/56  Pulse: 94 83  Resp: 16 18  Temp: 37 C   SpO2: 100% 100%    Last Pain:  Vitals:   06/21/24 0354  TempSrc: Oral  PainSc:                  Lynwood MARLA Cornea

## 2024-06-21 NOTE — Progress Notes (Signed)
   06/21/24 1500  Orthostatic Lying   BP- Lying 123/56  Orthostatic Sitting  BP- Sitting 107/58  Orthostatic Standing at 0 minutes  BP- Standing at 0 minutes (!) 88/45   Pt orthostatic during OT treatment sessions, denied any symptoms. Returned to seated, and with 5 minute rest break, recovered to 110/54. See full OT note for further details.   Tyreka Henneke C, OT  Acute Rehabilitation Services Office [phone] Secure chat preferred

## 2024-06-21 NOTE — TOC Progression Note (Signed)
 Transition of Care [facility]) - Progression Note    Patient Details  Name: Drew Adams MRN: [id number] Date of Birth: 1941/02/12  Transition of [facility]) CM/SW Contact  Montie LOISE Louder, [place] Phone Number: 06/21/2024, 12:54 PM  Clinical Narrative:     Called patient's daughter, [name] to provide SNF bed offers- she requested to leave in the room with the patient.   Montie Louder, MSW, LCSW Clinical Social Worker    Expected Discharge Plan: Skilled Nursing Facility Barriers to Discharge: Continued Medical Work up               Expected Discharge Plan and Services In-house Referral: Clinical Social Work Discharge Planning Services: CM Consult Post Acute Care Choice: Home Health, Resumption of Svcs/PTA Provider, Skilled Nursing Facility Living arrangements for the past 2 months: Single Family Home                 DME Arranged: N/A DME Agency: NA       HH Arranged: PT, OT HH Agency: [facility]         Social Drivers of Health (SDOH) Interventions SDOH Screenings   Food Insecurity: No Food Insecurity (06/17/2024)  Housing: Low Risk  (06/17/2024)  Transportation Needs: No Transportation Needs (06/17/2024)  Utilities: Not At Risk (06/17/2024)  Social Connections: Socially Isolated (06/17/2024)  Tobacco Use: Low Risk  (06/19/2024)    Readmission Risk Interventions     No data to display

## 2024-06-21 NOTE — Progress Notes (Signed)
 PROGRESS NOTE    Drew Adams  FMW:[id number] DOB: 1941-03-13 DOA: 06/17/2024 PCP: Stacia Millman, PA   Brief Narrative: Drew Adams is a 83 y.o. male with a history of aortic stenosis s/p TAVR, hyperlipidemia, CAD, CKD, BPH, urinary retention.  Patient presented secondary to a fall, suffering a scalp laceration with evidence of UTI and gross hematuria with resultant anemia requiring blood transfusion. Antibiotics started. Urology consulted. Hematuria catheter placed and CBI started however patient developed continued hematuria and recurrent obstruction from clots. Patient went for cystoscopy with clot evacuation on 11/24. CBI held on 11/26.   Assessment and Plan:  Fall Unclear etiology but could possibly related to orthostatic hypotension. Patient with evidence of hypotension overnight while admitted. Likely contributed to by acute anemia from gross hematuria. PT/OT recommending home health therapy on discharge.  Head laceration Secondary to fall. CT head/cervical spine unremarkable for acute process. Laceration sutured by EDP; 4 sutures placed on 11/22. -Remove sutures in 7-10 days from placement.  Leukocytosis Patient did not meet SIRS criteria on admission. Associated lactic acidosis. Likely related to underlying urinary tract infection and possible associated dehydration. WBC has remained stable. -CBC daily  Hypotension Likely related to acute anemia, rather than infectious etiology. Resolved with IV fluids.  Acute urinary retention BPH Patient with recent foley catheter placement for management. Catheter replaced this admission.  -Continue foley catheter -Continue tamsulosin  and finasteride   Gross hematuria Unclear etiology. Possibly related to cystitis versus trauma in setting of underlying BPH and foley placement. Complicated by home Aspirin , in addition to subcutaneous heparin  started this admission for VTE prophylaxis. Patient with associated anemia requiring blood  transfusion. Foley catheter in place on admission. Urology consulted and converted patient to a hematuria catheter, starting CBI. Urine continued to show evidence of hematuria. Urology took patient to the OR for cystoscopy and clot evacuation on 11/24. -Urology recommendations: hold CBI  Acute blood loss anemia Secondary to gross hematuria. Hemoglobin of 10.1 on admission, although more recent baseline appears to be between 8-9. Patient has required 2 units of PRBC via transfusion to date. Hemoglobin down to 7.2 today -CBC daily  History of cystitis UTI Presumed hemorrhagic cystitis. Patient treated with antibiotics during prior hospitalization. Cefepime  started this admission. Urine culture obtained and is no growth. -Discontinue Cefepime  and Vancomycin   AKI on CKD stage 3a Baseline creatinine appear to be around 1.3 from earlier this year, prior to recent urologic issues. Creatinine of 2.26 on admission and has trended up to a peak of 2.42. Concern this is related to obstructive cause, as he had this issue on his most recent admission. Renal ultrasound significant for evidence of no hydronephrosis (post-foley exchange). Creatinine trending down now. Urine output is complicated by CBI documentation. -Trend BMP  Cognitive communication deficit Speech therapy evaluated and recommend outpatient speech therapy on discharge.  Thrombocytopenia Likely related to acute blood loss. -Trend with CBC  Left renal cyst Noted on imaging. Recommendation for follow-up ultrasound in 6 months.  History of TAVR Noted.  CAD Noted. No chest pain. -Hold aspirin  pending urology recommendations  Pressure injury Present on admission. Right buttocks.   DVT prophylaxis: SCDs Code Status:   Code Status: Full Code Family Communication: None at bedside. Disposition Plan: Discharge pending stable hemoglobin, urology recommendations, stable renal function   Consultants:  Urology  Procedures:   Cystoscopy clot EVAC, urethral dilation complex catheter, placement over wire, bladder calculus evacuation   Antimicrobials: Cefepime   Vancomycin    Subjective: Urine bag with slightly darker urine than yesterday.  Patient without issues overnight. Reports wanting his foley catheter removed.  Objective: BP (!) 108/54 (BP Location: Left Arm)   Pulse 81   Temp (!) 97.5 F (36.4 C) (Oral)   Resp 16   Ht 5' 10 (1.778 m)   Wt 81.6 kg   SpO2 100%   BMI 25.83 kg/m   Examination:  General exam: Appears calm and comfortable. Respiratory system: Clear to auscultation. Respiratory effort normal. Cardiovascular system: S1 & S2 heard, RRR. Gastrointestinal system: Abdomen is nondistended, soft and nontender. Normal bowel sounds heard. Central nervous system: Alert and oriented. Musculoskeletal: No edema. No calf tenderness Psychiatry: Judgement and insight appear normal. Mood & affect appropriate.    Data Reviewed: I have personally reviewed following labs and imaging studies  CBC Lab Results  Component Value Date   WBC 13.1 (H) 06/21/2024   RBC 2.38 (L) 06/21/2024   HGB 7.2 (L) 06/21/2024   HCT 22.1 (L) 06/21/2024   MCV 92.9 06/21/2024   MCH 30.3 06/21/2024   PLT 117 (L) 06/21/2024   MCHC 32.6 06/21/2024   RDW 16.8 (H) 06/21/2024   LYMPHSABS 1.1 06/17/2024   MONOABS 1.6 (H) 06/17/2024   EOSABS 0.1 06/17/2024   BASOSABS 0.1 06/17/2024     Last metabolic panel Lab Results  Component Value Date   NA 140 06/21/2024   K 3.8 06/21/2024   CL 113 (H) 06/21/2024   CO2 18 (L) 06/21/2024   BUN 32 (H) 06/21/2024   CREATININE 1.93 (H) 06/21/2024   GLUCOSE 90 06/21/2024   GFRNONAA 34 (L) 06/21/2024   GFRAA 65 (L) 12/30/2012   CALCIUM  8.2 (L) 06/21/2024   PHOS 3.6 06/14/2024   PROT 5.5 (L) 06/17/2024   ALBUMIN  2.9 (L) 06/17/2024   BILITOT 1.9 (H) 06/17/2024   ALKPHOS 57 06/17/2024   AST 18 06/17/2024   ALT 7 06/17/2024   ANIONGAP 9 06/21/2024    GFR: Estimated  Creatinine Clearance: 31 mL/min (A) (by C-G formula based on SCr of 1.93 mg/dL (H)).  Recent Results (from the past 240 hours)  Urine Culture     Status: None   Collection Time: 06/11/24  5:23 PM   Specimen: Urine, Clean Catch  Result Value Ref Range Status   Specimen Description URINE, CLEAN CATCH  Final   Special Requests NONE  Final   Culture   Final    NO GROWTH Performed at [facility], [address], [place], [place] [address]    Report Status 06/12/2024 FINAL  Final  Blood Culture (routine x 2)     Status: None   Collection Time: 06/11/24  8:22 PM   Specimen: BLOOD  Result Value Ref Range Status   Specimen Description BLOOD BLOOD RIGHT HAND  Final   Special Requests   Final    BOTTLES DRAWN AEROBIC AND ANAEROBIC Blood Culture adequate volume   Culture   Final    NO GROWTH 5 DAYS Performed at [facility], [address] [place], [place] [address]    Report Status 06/16/2024 FINAL  Final  Urine Culture     Status: None   Collection Time: 06/11/24  8:23 PM   Specimen: Urine, Clean Catch  Result Value Ref Range Status   Specimen Description URINE, CLEAN CATCH  Final   Special Requests NONE  Final   Culture   Final    NO GROWTH Performed at [facility], [address] [place], [place] [address]    Report Status 06/13/2024 FINAL  Final  Blood Culture (routine x 2)     Status: None   Collection Time: 06/11/24  8:27 PM   Specimen: BLOOD  Result Value Ref Range Status   Specimen Description BLOOD BLOOD RIGHT HAND  Final   Special Requests   Final    BOTTLES DRAWN AEROBIC AND ANAEROBIC Blood Culture results may not be optimal due to an inadequate volume of blood received in culture bottles   Culture   Final    NO GROWTH 5 DAYS Performed at [facility], [address] [place], [place] [address]    Report Status 06/16/2024 FINAL  Final  Resp panel by RT-PCR (RSV, Flu A&B, Covid) Anterior Nasal Swab     Status: None   Collection Time:  06/12/24  8:38 AM   Specimen: Anterior Nasal Swab  Result Value Ref Range Status   SARS Coronavirus 2 by RT PCR NEGATIVE NEGATIVE Final   Influenza A by PCR NEGATIVE NEGATIVE Final   Influenza B by PCR NEGATIVE NEGATIVE Final    Comment: (NOTE) The Xpert Xpress SARS-CoV-2/FLU/RSV plus assay is intended as an aid in the diagnosis of influenza from Nasopharyngeal swab specimens and should not be used as a sole basis for treatment. Nasal washings and aspirates are unacceptable for Xpert Xpress SARS-CoV-2/FLU/RSV testing.  Fact Sheet for Patients: bloggercourse.com  Fact Sheet for Healthcare Providers: seriousbroker.it  This test is not yet approved or cleared by the [place]  FDA and has been authorized for detection and/or diagnosis of SARS-CoV-2 by FDA under an Emergency Use Authorization (EUA). This EUA will remain in effect (meaning this test can be used) for the duration of the COVID-19 declaration under Section 564(b)(1) of the Act, 21 U.S.C. section 360bbb-3(b)(1), unless the authorization is terminated or revoked.     Resp Syncytial Virus by PCR NEGATIVE NEGATIVE Final    Comment: (NOTE) Fact Sheet for Patients: bloggercourse.com  Fact Sheet for Healthcare Providers: seriousbroker.it  This test is not yet approved or cleared by the [place]  FDA and has been authorized for detection and/or diagnosis of SARS-CoV-2 by FDA under an Emergency Use Authorization (EUA). This EUA will remain in effect (meaning this test can be used) for the duration of the COVID-19 declaration under Section 564(b)(1) of the Act, 21 U.S.C. section 360bbb-3(b)(1), unless the authorization is terminated or revoked.  Performed at [facility], [address] [place], [place] [address]   Blood culture (routine x 2)     Status: None (Preliminary result)   Collection Time: 06/17/24   4:53 AM   Specimen: BLOOD RIGHT HAND  Result Value Ref Range Status   Specimen Description BLOOD RIGHT HAND  Final   Special Requests   Final    BOTTLES DRAWN AEROBIC ONLY Blood Culture results may not be optimal due to an inadequate volume of blood received in culture bottles   Culture   Final    NO GROWTH 4 DAYS Performed at [facility], [address], [place], [place] [address]    Report Status PENDING  Incomplete  Blood culture (routine x 2)     Status: None (Preliminary result)   Collection Time: 06/17/24  6:15 AM   Specimen: BLOOD LEFT ARM  Result Value Ref Range Status   Specimen Description BLOOD LEFT ARM  Final   Special Requests   Final    BOTTLES DRAWN AEROBIC AND ANAEROBIC Blood Culture results may not be optimal due to an inadequate volume of blood received  in culture bottles   Culture   Final    NO GROWTH 4 DAYS Performed at [facility], [address], [place], [place] [address]    Report Status PENDING  Incomplete  Urine Culture     Status: None   Collection Time: 06/17/24  8:55 AM   Specimen: Urine, Clean Catch  Result Value Ref Range Status   Specimen Description URINE, CLEAN CATCH  Final   Special Requests NONE  Final   Culture   Final    NO GROWTH Performed at [facility], [address], [place], [place] [address]    Report Status 06/19/2024 FINAL  Final      Radiology Studies: CT CYSTOGRAM PELVIS Result Date: 06/19/2024 CLINICAL DATA:  Abdominal abscess.  Pain. EXAM: CT CYSTOGRAM (CT PELVIS WITH CONTRAST) TECHNIQUE: Multidetector CT imaging through the pelvis was performed after dilute contrast had been introduced into the bladder for the purposes of performing CT cystography. RADIATION DOSE REDUCTION: This exam was performed according to the departmental dose-optimization program which includes automated exposure control, adjustment of the mA and/or kV according to patient size and/or use of iterative reconstruction technique.  CONTRAST:  40 cc Omnipaque  300 COMPARISON:  CT abdomen pelvis dated 06/18/2024. FINDINGS: Urinary Tract: Large amount of high attenuating content noted within the urinary bladder most consistent with blood products/clot. Multiple calcifications within the bladder and associated with the blood clot measure up to 1 cm. There is a Foley catheter within the urinary bladder. Small amount of air within the bladder introduced via the catheter. There is mild trabeculated appearance of the bladder wall suggestive of chronic bladder outlet obstruction. No extraluminal contrast. Bowel:  Unremarkable visualized pelvic bowel loops. Vascular/Lymphatic: Moderate atherosclerotic calcification of the visualized aorta and iliac arteries. No pelvic adenopathy. Reproductive: Enlarged prostate gland measuring 6.2 cm in transverse axial diameter. Other:  None Musculoskeletal: Osteopenia with degenerative changes. No acute osseous pathology. IMPRESSION: 1. Large amount of blood products/clot within the urinary bladder. No extraluminal contrast. 2. Enlarged prostate gland with findings of chronic bladder outlet obstruction. 3.  Aortic Atherosclerosis (ICD10-I70.0). Electronically Signed   By: Vanetta Chou M.D.   On: 06/19/2024 13:45      LOS: 3 days    Elgin Lam, MD [facility] 06/21/2024, 7:55 AM   If 7PM-7AM, please contact night-coverage [email or web address]

## 2024-06-22 DIAGNOSIS — R310 Gross hematuria: Secondary | ICD-10-CM | POA: Diagnosis not present

## 2024-06-22 DIAGNOSIS — N179 Acute kidney failure, unspecified: Secondary | ICD-10-CM | POA: Diagnosis not present

## 2024-06-22 DIAGNOSIS — R296 Repeated falls: Secondary | ICD-10-CM | POA: Diagnosis not present

## 2024-06-22 LAB — CULTURE, BLOOD (ROUTINE X 2)
Culture: NO GROWTH
Culture: NO GROWTH

## 2024-06-22 LAB — BASIC METABOLIC PANEL WITH GFR
Anion gap: 9 (ref 5–15)
BUN: 29 mg/dL — ABNORMAL HIGH (ref 8–23)
CO2: 20 mmol/L — ABNORMAL LOW (ref 22–32)
Calcium: 7.9 mg/dL — ABNORMAL LOW (ref 8.9–10.3)
Chloride: 110 mmol/L (ref 98–111)
Creatinine, Ser: 1.8 mg/dL — ABNORMAL HIGH (ref 0.61–1.24)
GFR, Estimated: 37 mL/min — ABNORMAL LOW (ref >60)
Glucose, Bld: 99 mg/dL (ref 70–99)
Potassium: 3.4 mmol/L — ABNORMAL LOW (ref 3.5–5.1)
Sodium: 139 mmol/L (ref 135–145)

## 2024-06-22 LAB — CBC
HCT: 21.4 % — ABNORMAL LOW (ref 39.0–52.0)
Hemoglobin: 7 g/dL — ABNORMAL LOW (ref 13.0–17.0)
MCH: 30.4 pg (ref 26.0–34.0)
MCHC: 32.7 g/dL (ref 30.0–36.0)
MCV: 93 fL (ref 80.0–100.0)
Platelets: 119 K/uL — ABNORMAL LOW (ref 150–400)
RBC: 2.3 MIL/uL — ABNORMAL LOW (ref 4.22–5.81)
RDW: 16.6 % — ABNORMAL HIGH (ref 11.5–15.5)
WBC: 9.4 K/uL (ref 4.0–10.5)
nRBC: 0 % (ref 0.0–0.2)

## 2024-06-22 MED ORDER — POTASSIUM CHLORIDE CRYS ER 20 MEQ PO TBCR
40.0000 meq | EXTENDED_RELEASE_TABLET | Freq: Once | ORAL | Status: AC
Start: 2024-06-22 — End: 2024-06-22
  Administered 2024-06-22: 40 meq via ORAL
  Filled 2024-06-22: qty 2

## 2024-06-22 NOTE — Plan of Care (Signed)

## 2024-06-22 NOTE — Plan of Care (Signed)
  Problem: Education: Goal: Knowledge of General Education information will improve Description: Including pain rating scale, medication(s)/side effects and non-pharmacologic comfort measures Outcome: Progressing   Problem: Clinical Measurements: Goal: Ability to maintain clinical measurements within normal limits will improve Outcome: Progressing   Problem: Activity: Goal: Risk for activity intolerance will decrease Outcome: Progressing   Problem: Nutrition: Goal: Adequate nutrition will be maintained Outcome: Progressing   Problem: Elimination: Goal: Will not experience complications related to urinary retention Outcome: Progressing   Problem: Pain Managment: Goal: General experience of comfort will improve and/or be controlled Outcome: Progressing   Problem: Safety: Goal: Ability to remain free from injury will improve Outcome: Progressing

## 2024-06-22 NOTE — Progress Notes (Signed)
 Pt previously with foley catheter for gross hematuria and CBI. Foley removed prior to transfer shortly before noon. Pt bladder scanned around 1700 and found to be retaining of urine. Pt states that he does not feel that he needs to void but wants to wait to try. This RN instructed that if pt cannot void, he might have to be catheterized again. Pt adamant about not going through that again and is refusing at this time. Hospitalist Dr. Briana made aware of situation. Orders placed for foley catheter placement if pt changes his mind. Will continue to monitor and encourage to void

## 2024-06-22 NOTE — Progress Notes (Signed)
 PROGRESS NOTE    Drew Adams  FMW:[id number] DOB: Apr 06, 1941 DOA: 06/17/2024 PCP: Stacia Millman, PA   Brief Narrative: Drew Adams is a 83 y.o. male with a history of aortic stenosis s/p TAVR, hyperlipidemia, CAD, CKD, BPH, urinary retention.  Patient presented secondary to a fall, suffering a scalp laceration with evidence of UTI and gross hematuria with resultant anemia requiring blood transfusion. Antibiotics started. Urology consulted. Hematuria catheter placed and CBI started however patient developed continued hematuria and recurrent obstruction from clots. Patient went for cystoscopy with clot evacuation on 11/24. CBI held on 11/26.   Assessment and Plan:  Fall Unclear etiology but could possibly related to orthostatic hypotension. Patient with evidence of hypotension overnight while admitted. Likely contributed to by acute anemia from gross hematuria. PT/OT recommending home health therapy on discharge.  Head laceration Secondary to fall. CT head/cervical spine unremarkable for acute process. Laceration sutured by EDP; 4 sutures placed on 11/22. -Remove sutures in 7-10 days from placement.  Leukocytosis Patient did not meet SIRS criteria on admission. Associated lactic acidosis. Likely related to underlying urinary tract infection and possible associated dehydration. WBC improved and leukocytosis resolved.  Hypotension Likely related to acute anemia, rather than infectious etiology. Resolved with IV fluids however patient still has orthostatic hypotension.  Orthostatic hypotension Asymptomatic. Possibly related to anemia. Patient prefers to avoid further blood transfusions and declines a second one at this time. -TED hose -Abdominal binder  Acute urinary retention BPH Patient with recent foley catheter placement for management. Catheter replaced this admission. Urology okay with voiding trial -Remove foley catheter for voiding trial -Bladder scans q4 -Continue  tamsulosin  and finasteride   Gross hematuria Unclear etiology. Possibly related to cystitis versus trauma in setting of underlying BPH and foley placement. Complicated by home Aspirin , in addition to subcutaneous heparin  started this admission for VTE prophylaxis. Patient with associated anemia requiring blood transfusion. Foley catheter in place on admission. Urology consulted and converted patient to a hematuria catheter, starting CBI. Urine continued to show evidence of hematuria. Urology took patient to the OR for cystoscopy and clot evacuation on 11/24. Hematuria is now resolved. CBI discontinued. Okay for foley catheter to be removed, per urology.  Acute blood loss anemia Secondary to gross hematuria. Hemoglobin of 10.1 on admission, although more recent baseline appears to be between 8-9. Patient has required 2 units of PRBC via transfusion to date. Hemoglobin down to 7.0 today but no continued hematuria. -CBC daily  History of cystitis UTI Presumed hemorrhagic cystitis. Patient treated with antibiotics during prior hospitalization. Cefepime  started this admission. Urine culture obtained and is no growth. Antibiotics discontinued.  AKI on CKD stage 3a Baseline creatinine appear to be around 1.3 from earlier this year, prior to recent urologic issues. Creatinine of 2.26 on admission and has trended up to a peak of 2.42. Concern this is related to obstructive cause, as he had this issue on his most recent admission. Renal ultrasound significant for evidence of no hydronephrosis (post-foley exchange). Creatinine trending down now. Urine output is complicated by CBI documentation. Creatinine improved to 1.80.  Hypokalemia -Potassium supplementation  Cognitive communication deficit Speech therapy evaluated and recommend outpatient speech therapy on discharge.  Thrombocytopenia Likely related to acute blood loss. -Trend with CBC  Left renal cyst Noted on imaging. Recommendation for  follow-up ultrasound in 6 months.  History of TAVR Noted.  CAD Noted. No chest pain. -Hold aspirin  pending urology recommendations  Pressure injury Present on admission. Right buttocks.   DVT prophylaxis: SCDs Code  Status:   Code Status: Full Code Family Communication: Daughter at bedside. Disposition Plan: Discharge pending stable hemoglobin, urology recommendations, stable renal function   Consultants:  Urology  Procedures:  Cystoscopy clot EVAC, urethral dilation complex catheter, placement over wire, bladder calculus evacuation   Antimicrobials: Cefepime   Vancomycin    Subjective: No issues this morning. Feels well. Hoping to attempt a voiding trial.  Objective: BP (!) 118/59 (BP Location: Left Arm)   Pulse 82   Temp 97.6 F (36.4 C) (Oral)   Resp 16   Ht 5' 10 (1.778 m)   Wt 81.6 kg   SpO2 99%   BMI 25.83 kg/m   Examination:  General exam: Appears calm and comfortable. Respiratory system: Clear to auscultation. Respiratory effort normal. Cardiovascular system: S1 & S2 heard, RRR. Gastrointestinal system: Abdomen is nondistended, soft and nontender. Normal bowel sounds heard. Central nervous system: Alert and oriented. Musculoskeletal: No edema. No calf tenderness Psychiatry: Judgement and insight appear normal. Mood & affect appropriate.    Data Reviewed: I have personally reviewed following labs and imaging studies  CBC Lab Results  Component Value Date   WBC 9.4 06/22/2024   RBC 2.30 (L) 06/22/2024   HGB 7.0 (L) 06/22/2024   HCT 21.4 (L) 06/22/2024   MCV 93.0 06/22/2024   MCH 30.4 06/22/2024   PLT 119 (L) 06/22/2024   MCHC 32.7 06/22/2024   RDW 16.6 (H) 06/22/2024   LYMPHSABS 1.1 06/17/2024   MONOABS 1.6 (H) 06/17/2024   EOSABS 0.1 06/17/2024   BASOSABS 0.1 06/17/2024     Last metabolic panel Lab Results  Component Value Date   NA 139 06/22/2024   K 3.4 (L) 06/22/2024   CL 110 06/22/2024   CO2 20 (L) 06/22/2024   BUN 29 (H)  06/22/2024   CREATININE 1.80 (H) 06/22/2024   GLUCOSE 99 06/22/2024   GFRNONAA 37 (L) 06/22/2024   GFRAA 65 (L) 12/30/2012   CALCIUM  7.9 (L) 06/22/2024   PHOS 3.6 06/14/2024   PROT 5.5 (L) 06/17/2024   ALBUMIN  2.9 (L) 06/17/2024   BILITOT 1.9 (H) 06/17/2024   ALKPHOS 57 06/17/2024   AST 18 06/17/2024   ALT 7 06/17/2024   ANIONGAP 9 06/22/2024    GFR: Estimated Creatinine Clearance: 33.2 mL/min (A) (by C-G formula based on SCr of 1.8 mg/dL (H)).  Recent Results (from the past 240 hours)  Blood culture (routine x 2)     Status: None   Collection Time: 06/17/24  4:53 AM   Specimen: BLOOD RIGHT HAND  Result Value Ref Range Status   Specimen Description BLOOD RIGHT HAND  Final   Special Requests   Final    BOTTLES DRAWN AEROBIC ONLY Blood Culture results may not be optimal due to an inadequate volume of blood received in culture bottles   Culture   Final    NO GROWTH 5 DAYS Performed at [facility], [address], [place], [place] [address]    Report Status 06/22/2024 FINAL  Final  Blood culture (routine x 2)     Status: None   Collection Time: 06/17/24  6:15 AM   Specimen: BLOOD LEFT ARM  Result Value Ref Range Status   Specimen Description BLOOD LEFT ARM  Final   Special Requests   Final    BOTTLES DRAWN AEROBIC AND ANAEROBIC Blood Culture results may not be optimal due to an inadequate volume of blood received in culture bottles   Culture   Final    NO GROWTH 5 DAYS  Performed at [facility], [address], [place], [place] [address]    Report Status 06/22/2024 FINAL  Final  Urine Culture     Status: None   Collection Time: 06/17/24  8:55 AM   Specimen: Urine, Clean Catch  Result Value Ref Range Status   Specimen Description URINE, CLEAN CATCH  Final   Special Requests NONE  Final   Culture   Final    NO GROWTH Performed at [facility], [address] [place], [place] [address]    Report Status 06/19/2024 FINAL  Final      Radiology  Studies: No results found.     LOS: 4 days    Elgin Lam, MD [facility] 06/22/2024, 11:08 AM   If 7PM-7AM, please contact night-coverage [email or web address]

## 2024-06-22 NOTE — Progress Notes (Signed)
 3 Days Post-Op  Subjective: Urine clear off of CBI.   He has no pain or catheter complaints.  Hgb 7.0 which is minimally decreased.  Cr is improved at 1.8.   ROS:  Review of Systems  Constitutional: Negative.   Gastrointestinal: Negative.   Genitourinary: Negative.     Anti-infectives: Anti-infectives (From admission, onward)    Start     Dose/Rate Route Frequency Ordered Stop   06/19/24 0800  vancomycin  (VANCOREADY) IVPB 1500 mg/300 mL  Status:  Discontinued        1,500 mg 150 mL/hr over 120 Minutes Intravenous Every 48 hours 06/17/24 2108 06/21/24 0811   06/18/24 0600  ceFEPIme  (MAXIPIME ) 2 g in sodium chloride  0.9 % 100 mL IVPB  Status:  Discontinued        2 g 200 mL/hr over 30 Minutes Intravenous Every 24 hours 06/17/24 1315 06/21/24 0811   06/17/24 0515  cefTRIAXone  (ROCEPHIN ) 1 g in sodium chloride  0.9 % 100 mL IVPB  Status:  Discontinued        1 g 200 mL/hr over 30 Minutes Intravenous  Once 06/17/24 0510 06/17/24 0510   06/17/24 0515  ceFEPIme  (MAXIPIME ) 2 g in sodium chloride  0.9 % 100 mL IVPB        2 g 200 mL/hr over 30 Minutes Intravenous  Once 06/17/24 0513 06/17/24 0645   06/17/24 0515  vancomycin  (VANCOREADY) IVPB 2000 mg/400 mL        2,000 mg 200 mL/hr over 120 Minutes Intravenous  Once 06/17/24 0513 06/17/24 0855       Current Facility-Administered Medications  Medication Dose Route Frequency Provider Last Rate Last Admin   0.9 %  sodium chloride  infusion   Intravenous Continuous Claudene Maximino LABOR, MD   Stopped at 06/21/24 9061   acetaminophen  (TYLENOL ) tablet 650 mg  650 mg Oral Q6H PRN Smith, Rondell A, MD   650 mg at 06/18/24 0327   Or   acetaminophen  (TYLENOL ) suppository 650 mg  650 mg Rectal Q6H PRN Claudene Maximino LABOR, MD       albuterol  (PROVENTIL ) (2.5 MG/3ML) 0.083% nebulizer solution 2.5 mg  2.5 mg Nebulization Q6H PRN Smith, Rondell A, MD       allopurinol  (ZYLOPRIM ) tablet 300 mg  300 mg Oral Daily Claudene, Rondell A, MD   300 mg at 06/21/24 9056    Chlorhexidine  Gluconate Cloth 2 % PADS 6 each  6 each Topical Daily Smith, Rondell A, MD   6 each at 06/21/24 0932   finasteride  (PROSCAR ) tablet 5 mg  5 mg Oral Daily Smith, Rondell A, MD   5 mg at 06/21/24 9056   HYDROcodone -acetaminophen  (NORCO/VICODIN) 5-325 MG per tablet 1 tablet  1 tablet Oral Q4H PRN Briana Elgin LABOR, MD   1 tablet at 06/21/24 2047   iohexol  (OMNIPAQUE ) 350 MG/ML injection 40 mL  40 mL Per Tube Once PRN Showalter, Victor C, MD       meclizine  (ANTIVERT ) tablet 25 mg  25 mg Oral TID PRN Smith, Rondell A, MD       ondansetron  (ZOFRAN ) tablet 4 mg  4 mg Oral Q6H PRN Claudene Maximino A, MD       Or   ondansetron  (ZOFRAN ) injection 4 mg  4 mg Intravenous Q6H PRN Smith, Rondell A, MD       pantoprazole  (PROTONIX ) EC tablet 40 mg  40 mg Oral Daily Claudene, Rondell A, MD   40 mg at 06/21/24 0943   sodium chloride  flush (NS) 0.9 %  injection 3 mL  3 mL Intravenous Q12H Smith, Rondell A, MD   3 mL at 06/22/24 0043   tamsulosin  (FLOMAX ) capsule 0.4 mg  0.4 mg Oral Daily Claudene, Rondell A, MD   0.4 mg at 06/21/24 0943     Objective: Vital signs in last 24 hours: Temp:  [97.5 F (36.4 C)-98.1 F (36.7 C)] 97.5 F (36.4 C) (11/27 0429) Pulse Rate:  [73-87] 73 (11/27 0429) Resp:  [9-20] 9 (11/27 0429) BP: (100-153)/(54-83) 113/62 (11/27 0347) SpO2:  [100 %] 100 % (11/26 1540)  Intake/Output from previous day: 11/26 0701 - 11/27 0700 In: 1184.2 [P.O.:240; I.V.:944.2] Out: 3000 [Urine:3000] Intake/Output this shift: Total I/O In: 3 [I.V.:3] Out: 1050 [Urine:1050]   Physical Exam Vitals reviewed.  Constitutional:      Appearance: Normal appearance.  Genitourinary:    Comments: Urine clear in foley bag.  Neurological:     Mental Status: He is alert.     Lab Results:  Recent Labs    06/21/24 0307 06/22/24 0259  WBC 13.1* 9.4  HGB 7.2* 7.0*  HCT 22.1* 21.4*  PLT 117* 119*   BMET Recent Labs    06/21/24 0307 06/22/24 0259  NA 140 139  K 3.8 3.4*  CL 113* 110   CO2 18* 20*  GLUCOSE 90 99  BUN 32* 29*  CREATININE 1.93* 1.80*  CALCIUM  8.2* 7.9*   PT/INR No results for input(s): LABPROT, INR in the last 72 hours. ABG No results for input(s): PHART, HCO3 in the last 72 hours.  Invalid input(s): PCO2, PO2  Studies/Results: No results found.   Assessment and Plan: Clot retention.   Urine is clear today off of CBI.   He can have the foley removed at anytime.  He wanted to think about it when I offered this morning.   He will need PVR's checked once it is removed.       LOS: 4 days    Norleen Seltzer 06/22/2024

## 2024-06-23 DIAGNOSIS — R296 Repeated falls: Secondary | ICD-10-CM | POA: Diagnosis not present

## 2024-06-23 DIAGNOSIS — N401 Enlarged prostate with lower urinary tract symptoms: Secondary | ICD-10-CM | POA: Diagnosis not present

## 2024-06-23 DIAGNOSIS — Z7189 Other specified counseling: Secondary | ICD-10-CM

## 2024-06-23 DIAGNOSIS — R310 Gross hematuria: Secondary | ICD-10-CM | POA: Diagnosis not present

## 2024-06-23 LAB — CBC
HCT: 23.8 % — ABNORMAL LOW (ref 39.0–52.0)
Hemoglobin: 7.7 g/dL — ABNORMAL LOW (ref 13.0–17.0)
MCH: 30.4 pg (ref 26.0–34.0)
MCHC: 32.4 g/dL (ref 30.0–36.0)
MCV: 94.1 fL (ref 80.0–100.0)
Platelets: 137 K/uL — ABNORMAL LOW (ref 150–400)
RBC: 2.53 MIL/uL — ABNORMAL LOW (ref 4.22–5.81)
RDW: 17 % — ABNORMAL HIGH (ref 11.5–15.5)
WBC: 9.4 K/uL (ref 4.0–10.5)
nRBC: 0 % (ref 0.0–0.2)

## 2024-06-23 LAB — MAGNESIUM: Magnesium: 1.6 mg/dL — ABNORMAL LOW (ref 1.7–2.4)

## 2024-06-23 LAB — BASIC METABOLIC PANEL WITH GFR
Anion gap: 7 (ref 5–15)
BUN: 34 mg/dL — ABNORMAL HIGH (ref 8–23)
CO2: 20 mmol/L — ABNORMAL LOW (ref 22–32)
Calcium: 8.2 mg/dL — ABNORMAL LOW (ref 8.9–10.3)
Chloride: 111 mmol/L (ref 98–111)
Creatinine, Ser: 2.29 mg/dL — ABNORMAL HIGH (ref 0.61–1.24)
GFR, Estimated: 28 mL/min — ABNORMAL LOW (ref >60)
Glucose, Bld: 117 mg/dL — ABNORMAL HIGH (ref 70–99)
Potassium: 4.2 mmol/L (ref 3.5–5.1)
Sodium: 138 mmol/L (ref 135–145)

## 2024-06-23 NOTE — TOC Progression Note (Addendum)
 Transition of Care [facility]) - Progression Note    Patient Details  Name: Drew Adams MRN: [id number] Date of Birth: 1941/05/14  [facility] CM/SW Contact  Bridget Cordella Simmonds, LCSW Phone Number: 06/23/2024, 10:34 AM  Clinical Narrative:   CSW spoke with pt daughter Eva regarding SNF bed offers: she wants to accept offer at [facility].    CSW confirmed with [facility]: they will have bed available tomorrow.  SNF auth request submitted with Erica/[facility]: SNF and PTAR.  1605: SNF auth approved, 7 days: [id number].  PTAR approved: [id number].    Expected Discharge Plan: Skilled Nursing Facility Barriers to Discharge: Continued Medical Work up               Expected Discharge Plan and Services In-house Referral: Clinical Social Work Discharge Planning Services: CM Consult Post Acute Care Choice: Home Health, Resumption of Paediatric Nurse, Skilled Nursing Facility Living arrangements for the past 2 months: Single Family Home                 DME Arranged: N/A DME Agency: NA       HH Arranged: PT, OT HH Agency: [facility]         Social Drivers of Health (SDOH) Interventions SDOH Screenings   Food Insecurity: No Food Insecurity (06/17/2024)  Housing: Low Risk  (06/17/2024)  Transportation Needs: No Transportation Needs (06/17/2024)  Utilities: Not At Risk (06/17/2024)  Social Connections: Socially Isolated (06/17/2024)  Tobacco Use: Low Risk  (06/19/2024)    Readmission Risk Interventions     No data to display

## 2024-06-23 NOTE — Care Management Important Message (Signed)
 Important Message  Patient Details  Name: Drew Adams MRN: [id number] Date of Birth: 1941-01-25   Important Message Given:  Yes - Medicare IM     Jennie Laneta Dragon 06/23/2024, 12:45 PM

## 2024-06-23 NOTE — Progress Notes (Addendum)
 Physical Therapy Treatment Patient Details Name: Drew Adams MRN: [id number] DOB: 03/29/1941 Today's Date: 06/23/2024   History of Present Illness 83 y.o. male admitted 06/17/24 after two falls at home. First fall was controlled by family and lowered to the floor without injury.  Had subsequent fall early this morning with head injury. Noted to be orthostatic with EMS. Pt sustained an S-shaped 4cm laceration to top of scalp, no active bleeding, secondary wounds to left parietal scalp. Foley in place, dried blood present around meatus and along foley tubing, some blood collected in foley bag. Rapid Response called 11/23 d/t hypotension (78/38), fluid bolus ordered. PMHx: aortic stenosis s/p TAVR (05/30/2024), CAD, CKD 3B, dementia, urinary retention, BPH, and prior TIA. Of note, recent admission 11/16-11/20 for AKI and urinary retention.    PT Comments  Pt resting in bed on arrival, agreeable to session, however continues to be limited by symptomatic orthostatic hypotension, limiting standing tolerance and mobility progression. Pt requiring grossly min A to complete bed mobility, transfers sit<>stand and to take a few steps along EOB to [facility], however unable to progress gait away from EOB due to pt reporting lightheadedness and BP drop (see below in general comments). Pt able to performing seated exercises. Educated pt on elevating HOB to tolerance to improve hemodynamic stability with positional changes with pt verbalizing understanding. Pt assisted to phone daughters at end of session. Pt continues to benefit from skilled PT services to progress toward functional mobility goals and will benefit from continued inpatient follow up therapy, <3 hours/day.    If plan is discharge home, recommend the following: A lot of help with walking and/or transfers;A lot of help with bathing/dressing/bathroom;Assistance with cooking/housework;Assist for transportation;Help with stairs or ramp for entrance   Can travel by  private vehicle     Yes  Equipment Recommendations  Rolling walker (2 wheels) (may need WC depending on progress with BP and orthostatic hypotension)    Recommendations for Other Services       Precautions / Restrictions Precautions Precautions: Fall Recall of Precautions/Restrictions: Impaired Precaution/Restrictions Comments: Orthostatic Restrictions Weight Bearing Restrictions Per Provider Order: No     Mobility  Bed Mobility Overal bed mobility: Needs Assistance Bed Mobility: Supine to Sit, Sit to Supine     Supine to sit: Min assist Sit to supine: Min assist   General bed mobility comments: Min HH assist for trunk    Transfers Overall transfer level: Needs assistance Equipment used: Rolling walker (2 wheels) Transfers: Sit to/from Stand, Bed to chair/wheelchair/BSC Sit to Stand: Min assist           General transfer comment: light min a to steady on rise x2    Ambulation/Gait Ambulation/Gait assistance: Contact guard assist Gait Distance (Feet): 3 Feet Assistive device: Rolling walker (2 wheels) Gait Pattern/deviations: Step-to pattern       General Gait Details: pt taking a few steps along EOB to [facility]   Stairs             Wheelchair Mobility     Tilt Bed    Modified Rankin (Stroke Patients Only)       Balance Overall balance assessment: Needs assistance Sitting-balance support: Bilateral upper extremity supported, Feet supported Sitting balance-Leahy Scale: Fair Sitting balance - Comments: Pt sat EOB with close supervision.   Standing balance support: Bilateral upper extremity supported, During functional activity, Reliant on assistive device for balance Standing balance-Leahy Scale: Poor Standing balance comment: BUE support in standing, some bracing of limbs against bed frame  PRN                            Communication Communication Communication: No apparent difficulties  Cognition Arousal: Alert Behavior  During Therapy: WFL for tasks assessed/performed   PT - Cognitive impairments: History of cognitive impairments (dementia)                       PT - Cognition Comments: very active at baseline, pt needs min cues for attention to task, often repeating questions during session Following commands: Intact      Cueing Cueing Techniques: Verbal cues, Gestural cues, Tactile cues  Exercises Other Exercises Other Exercises: seatec marching x30 Other Exercises: arm punching x30 seconds    General Comments General comments (skin integrity, edema, etc.): BP supine 122/57 (76), sitting after standing 74/39(51), sitting after 3 mins 81/53 (61) supine at end of session 105/59 (73)      Pertinent Vitals/Pain Pain Assessment Pain Assessment: Faces Faces Pain Scale: Hurts little more Pain Location: abdomen Pain Descriptors / Indicators: Guarding Pain Intervention(s): Monitored during session, Limited activity within patient's tolerance    Home Living                          Prior Function            PT Goals (current goals can now be found in the care plan section) Acute Rehab PT Goals Patient Stated Goal: To go to rehab and get stronger before home, I was running and independent just a couple weeks ago. PT Goal Formulation: With patient/family Time For Goal Achievement: 07/02/24 Progress towards PT goals: Progressing toward goals    Frequency    Min 2X/week      PT Plan      Co-evaluation              AM-PAC PT 6 Clicks Mobility   Outcome Measure  Help needed turning from your back to your side while in a flat bed without using bedrails?: A Little Help needed moving from lying on your back to sitting on the side of a flat bed without using bedrails?: A Lot Help needed moving to and from a bed to a chair (including a wheelchair)?: A Lot Help needed standing up from a chair using your arms (e.g., wheelchair or bedside chair)?: A Lot Help needed to  walk in hospital room?: Total Help needed climbing 3-5 steps with a railing? : Total 6 Click Score: 11    End of Session Equipment Utilized During Treatment: Gait belt Activity Tolerance: Patient tolerated treatment well;Treatment limited secondary to medical complications (Comment) (symptomatic drop in BP) Patient left: in bed;with call bell/phone within reach;with bed alarm set Nurse Communication: Mobility status PT Visit Diagnosis: Unsteadiness on feet (R26.81);Other abnormalities of gait and mobility (R26.89);Muscle weakness (generalized) (M62.81)     Time: 9041-8977 PT Time Calculation (min) (ACUTE ONLY): 24 min  Charges:    $Therapeutic Activity: 23-37 mins PT General Charges $$ ACUTE PT VISIT: 1 Visit                     Corianna Avallone R. PTA Acute Rehabilitation Services Office: [phone]   Therisa CHRISTELLA Boor 06/23/2024, 12:39 PM

## 2024-06-23 NOTE — Plan of Care (Signed)
  Add All Education: Knowledge of General Education information will improve Add Today at 2127 - Not Progressing by Rolfe Corean HERO, RN Add Health Behavior/Discharge Planning: Ability to manage health-related needs will improve Add Today at 2127 - Not Progressing by Rolfe Corean HERO, RN Add Clinical Measurements: Ability to maintain clinical measurements within normal limits will improve Add Today at 2127 - Not Progressing by Rolfe Corean HERO, RN Add Will remain free from infection Add Today at 2127 - Not Progressing by Rolfe Corean HERO, RN Add Diagnostic test results will improve Add Today at 2127 - Not Progressing by Rolfe Corean HERO, RN Add Respiratory complications will improve Add Today at 2127 - Not Progressing by Rolfe Corean HERO, RN Add Cardiovascular complication will be avoided Add Today at 2127 - Not Progressing by Rolfe Corean HERO, RN Add Activity: Risk for activity intolerance will decrease Add Today at 2127 - Not Progressing by Rolfe Corean HERO, RN Add Nutrition: Adequate nutrition will be maintained Add Today at 2127 - Not Progressing by Rolfe Corean HERO, RN Add Coping: Level of anxiety will decrease Add Today at 2127 - Not Progressing by Rolfe Corean HERO, RN Add Elimination: Will not experience complications related to bowel motility Add Today at 2127 - Not Progressing by Rolfe Corean HERO, RN Add Will not experience complications related to urinary retention Add Today at 2127 - Not Progressing by Rolfe Corean HERO, RN Add Pain Managment: General experience of comfort will improve and/or be controlled Add Today at 2127 - Not Progressing by Rolfe Corean HERO, RN Add Safety: Ability to remain free from injury will improve Add Today at 2127 - Not Progressing by Rolfe Corean HERO, RN Add Skin Integrity: Risk for impaired skin integrity will  decrease Add Today at 2127 - Not Progressing by Rolfe Corean HERO, RN

## 2024-06-23 NOTE — Progress Notes (Signed)
 PROGRESS NOTE    Drew Adams  FMW:[id number] DOB: [birth year] DOA: 06/17/2024 PCP: Stacia Millman, PA   Brief Narrative: Drew Adams is a 83 y.o. male with a history of aortic stenosis s/p TAVR, hyperlipidemia, CAD, CKD, BPH, urinary retention.  Patient presented secondary to a fall, suffering a scalp laceration with evidence of UTI and gross hematuria with resultant anemia requiring blood transfusion. Antibiotics started. Urology consulted. Hematuria catheter placed and CBI started however patient developed continued hematuria and recurrent obstruction from clots. Patient went for cystoscopy with clot evacuation on 11/24. CBI held on 11/26.   Assessment and Plan:  Fall Unclear etiology but could possibly related to orthostatic hypotension. Patient with evidence of hypotension overnight while admitted. Likely contributed to by acute anemia from gross hematuria. PT/OT recommending home health therapy on discharge.  Head laceration Secondary to fall. CT head/cervical spine unremarkable for acute process. Laceration sutured by EDP; 4 sutures placed on 11/22. -Remove sutures in 7-10 days from placement.  Leukocytosis Patient did not meet SIRS criteria on admission. Associated lactic acidosis. Likely related to underlying urinary tract infection and possible associated dehydration. WBC improved and leukocytosis resolved.  Hypotension Likely related to acute anemia, rather than infectious etiology. Resolved with IV fluids however patient still has orthostatic hypotension.  Orthostatic hypotension Asymptomatic. Possibly related to anemia. Patient prefers to avoid further blood transfusions and declines a second one at this time. -TED hose -Abdominal binder  Acute urinary retention BPH Patient with recent foley catheter placement for management. Catheter replaced this admission. Urology okay with voiding trial. Foley removed on 11/27 but patient has recurrent urinary retention and is  refusing catheter replacement.  -Continued attempts to convince patient to have foley catheter reinserted -Will request urology to see patient today as well -Bladder scans q4 -Continue tamsulosin  and finasteride   Gross hematuria Unclear etiology. Possibly related to cystitis versus trauma in setting of underlying BPH and foley placement. Complicated by home Aspirin , in addition to subcutaneous heparin  started this admission for VTE prophylaxis. Patient with associated anemia requiring blood transfusion. Foley catheter in place on admission. Urology consulted and converted patient to a hematuria catheter, starting CBI. Urine continued to show evidence of hematuria. Urology took patient to the OR for cystoscopy and clot evacuation on 11/24. Hematuria is now resolved. CBI discontinued. Okay for foley catheter to be removed, per urology.  Acute blood loss anemia Secondary to gross hematuria. Hemoglobin of 10.1 on admission, although more recent baseline appears to be between 8-9. Patient has required 2 units of PRBC via transfusion to date. Hemoglobin is stable.  History of cystitis UTI Presumed hemorrhagic cystitis. Patient treated with antibiotics during prior hospitalization. Cefepime  started this admission. Urine culture obtained and is no growth. Antibiotics discontinued.  AKI on CKD stage 3a Baseline creatinine appear to be around 1.3 from earlier this year, prior to recent urologic issues. Creatinine of 2.26 on admission and has trended up to a peak of 2.42. Concern this is related to obstructive cause, as he had this issue on his most recent admission. Renal ultrasound significant for evidence of no hydronephrosis (post-foley exchange). Creatinine trending down now. Urine output is complicated by CBI documentation. Creatinine improved to 1.80. -Check BMP now that patient has worsening retention  Hypokalemia Potassium supplementation given. -Check BMP  Cognitive communication  deficit Speech therapy evaluated and recommend outpatient speech therapy on discharge.  Thrombocytopenia Likely related to acute blood loss. -Trend with CBC  Left renal cyst Noted on imaging. Recommendation for follow-up ultrasound in  6 months.  History of TAVR Noted.  CAD Noted. No chest pain. -Hold aspirin  pending urology recommendations; will check final recommendations prior to patient discharge  Goals of care Patient reports that after his wife died, he is not worried about continuing his life as it is. He states that he does not want to be resuscitated if he were to die and does not want to be intubated, should he need it. It may be beneficial for patient to at least complete a MOST form prior to discharge. -Chaplain services for advanced directives  Pressure injury Present on admission. Right buttocks.   DVT prophylaxis: SCDs Code Status:   Code Status: Limited: Do not attempt resuscitation (DNR) -DNR-LIMITED -Do Not Intubate/DNI  Family Communication: Daughters on telephone Disposition Plan: Discharge to SNF pending hopeful insertion of a foley catheter.   Consultants:  Urology  Procedures:  Cystoscopy clot EVAC, urethral dilation complex catheter, placement over wire, bladder calculus evacuation   Antimicrobials: Cefepime   Vancomycin    Subjective: Patient reports not wanting a foley catheter in. He understands the risks involved including bladder perforation, renal failure and death, but is okay with these outcomes and shares his state of mind as it relates to his goals of care.  Objective: BP (!) 122/55 (BP Location: Left Arm)   Pulse 82   Temp 97.7 F (36.5 C) (Oral)   Resp 16   Ht 5' 10 (1.778 m)   Wt 81.6 kg   SpO2 97%   BMI 25.83 kg/m   Examination:  General exam: Appears calm and comfortable. Respiratory system: Clear to auscultation. Respiratory effort normal. Cardiovascular system: S1 & S2 heard, RRR. Gastrointestinal system: Abdomen is  distended in lower abdomen. Normal bowel sounds heard. Central nervous system: Alert and oriented. Psychiatry: Judgement and insight appear normal. Mood & affect appropriate.    Data Reviewed: I have personally reviewed following labs and imaging studies  CBC Lab Results  Component Value Date   WBC 9.4 06/23/2024   RBC 2.53 (L) 06/23/2024   HGB 7.7 (L) 06/23/2024   HCT 23.8 (L) 06/23/2024   MCV 94.1 06/23/2024   MCH 30.4 06/23/2024   PLT 137 (L) 06/23/2024   MCHC 32.4 06/23/2024   RDW 17.0 (H) 06/23/2024   LYMPHSABS 1.1 06/17/2024   MONOABS 1.6 (H) 06/17/2024   EOSABS 0.1 06/17/2024   BASOSABS 0.1 06/17/2024     Last metabolic panel Lab Results  Component Value Date   NA 139 06/22/2024   K 3.4 (L) 06/22/2024   CL 110 06/22/2024   CO2 20 (L) 06/22/2024   BUN 29 (H) 06/22/2024   CREATININE 1.80 (H) 06/22/2024   GLUCOSE 99 06/22/2024   GFRNONAA 37 (L) 06/22/2024   GFRAA 65 (L) 12/30/2012   CALCIUM  7.9 (L) 06/22/2024   PHOS 3.6 06/14/2024   PROT 5.5 (L) 06/17/2024   ALBUMIN  2.9 (L) 06/17/2024   BILITOT 1.9 (H) 06/17/2024   ALKPHOS 57 06/17/2024   AST 18 06/17/2024   ALT 7 06/17/2024   ANIONGAP 9 06/22/2024    GFR: Estimated Creatinine Clearance: 33.2 mL/min (A) (by C-G formula based on SCr of 1.8 mg/dL (H)).  Recent Results (from the past 240 hours)  Blood culture (routine x 2)     Status: None   Collection Time: 06/17/24  4:53 AM   Specimen: BLOOD RIGHT HAND  Result Value Ref Range Status   Specimen Description BLOOD RIGHT HAND  Final   Special Requests   Final    BOTTLES DRAWN AEROBIC  ONLY Blood Culture results may not be optimal due to an inadequate volume of blood received in culture bottles   Culture   Final    NO GROWTH 5 DAYS Performed at [facility], [address] [place], [place] [address]    Report Status 06/22/2024 FINAL  Final  Blood culture (routine x 2)     Status: None   Collection Time: 06/17/24  6:15 AM   Specimen: BLOOD LEFT  ARM  Result Value Ref Range Status   Specimen Description BLOOD LEFT ARM  Final   Special Requests   Final    BOTTLES DRAWN AEROBIC AND ANAEROBIC Blood Culture results may not be optimal due to an inadequate volume of blood received in culture bottles   Culture   Final    NO GROWTH 5 DAYS Performed at [facility], [address], [place], [place] [address]    Report Status 06/22/2024 FINAL  Final  Urine Culture     Status: None   Collection Time: 06/17/24  8:55 AM   Specimen: Urine, Clean Catch  Result Value Ref Range Status   Specimen Description URINE, CLEAN CATCH  Final   Special Requests NONE  Final   Culture   Final    NO GROWTH Performed at [facility], [address], [place], [place] [address]    Report Status 06/19/2024 FINAL  Final      Radiology Studies: No results found.     LOS: 5 days    Elgin Lam, MD [facility] 06/23/2024, 10:13 AM   If 7PM-7AM, please contact night-coverage [email or web address]

## 2024-06-23 NOTE — Progress Notes (Signed)
 4 Days Post-Op  Subjective: He has failed a voiding trial but reports no pain despite a PVR of >1051ml and no output today.  He has refused foley catheter placement.  ROS:  Review of Systems  Constitutional: Negative.   Gastrointestinal: Negative.   Genitourinary: Negative.     Anti-infectives: Anti-infectives (From admission, onward)    Start     Dose/Rate Route Frequency Ordered Stop   06/19/24 0800  vancomycin  (VANCOREADY) IVPB 1500 mg/300 mL  Status:  Discontinued        1,500 mg 150 mL/hr over 120 Minutes Intravenous Every 48 hours 06/17/24 2108 06/21/24 0811   06/18/24 0600  ceFEPIme  (MAXIPIME ) 2 g in sodium chloride  0.9 % 100 mL IVPB  Status:  Discontinued        2 g 200 mL/hr over 30 Minutes Intravenous Every 24 hours 06/17/24 1315 06/21/24 0811   06/17/24 0515  cefTRIAXone  (ROCEPHIN ) 1 g in sodium chloride  0.9 % 100 mL IVPB  Status:  Discontinued        1 g 200 mL/hr over 30 Minutes Intravenous  Once 06/17/24 0510 06/17/24 0510   06/17/24 0515  ceFEPIme  (MAXIPIME ) 2 g in sodium chloride  0.9 % 100 mL IVPB        2 g 200 mL/hr over 30 Minutes Intravenous  Once 06/17/24 0513 06/17/24 0645   06/17/24 0515  vancomycin  (VANCOREADY) IVPB 2000 mg/400 mL        2,000 mg 200 mL/hr over 120 Minutes Intravenous  Once 06/17/24 0513 06/17/24 0855       Current Facility-Administered Medications  Medication Dose Route Frequency Provider Last Rate Last Admin   0.9 %  sodium chloride  infusion   Intravenous Continuous Claudene Reeves A, MD 50 mL/hr at 06/23/24 0121 New Bag at 06/23/24 0121   acetaminophen  (TYLENOL ) tablet 650 mg  650 mg Oral Q6H PRN Smith, Rondell A, MD   650 mg at 06/22/24 9064   Or   acetaminophen  (TYLENOL ) suppository 650 mg  650 mg Rectal Q6H PRN Smith, Rondell A, MD       albuterol  (PROVENTIL ) (2.5 MG/3ML) 0.083% nebulizer solution 2.5 mg  2.5 mg Nebulization Q6H PRN Smith, Rondell A, MD       allopurinol  (ZYLOPRIM ) tablet 300 mg  300 mg Oral Daily Claudene, Rondell  A, MD   300 mg at 06/23/24 9188   Chlorhexidine  Gluconate Cloth 2 % PADS 6 each  6 each Topical Daily Smith, Rondell A, MD   6 each at 06/21/24 0932   finasteride  (PROSCAR ) tablet 5 mg  5 mg Oral Daily Smith, Rondell A, MD   5 mg at 06/23/24 9188   HYDROcodone -acetaminophen  (NORCO/VICODIN) 5-325 MG per tablet 1 tablet  1 tablet Oral Q4H PRN Briana Elgin LABOR, MD   1 tablet at 06/23/24 1602   iohexol  (OMNIPAQUE ) 350 MG/ML injection 40 mL  40 mL Per Tube Once PRN Showalter, Victor C, MD       meclizine  (ANTIVERT ) tablet 25 mg  25 mg Oral TID PRN Smith, Rondell A, MD       ondansetron  (ZOFRAN ) tablet 4 mg  4 mg Oral Q6H PRN Claudene Reeves A, MD       Or   ondansetron  (ZOFRAN ) injection 4 mg  4 mg Intravenous Q6H PRN Smith, Rondell A, MD       pantoprazole  (PROTONIX ) EC tablet 40 mg  40 mg Oral Daily Claudene, Rondell A, MD   40 mg at 06/23/24 0811   sodium chloride  flush (NS)  0.9 % injection 3 mL  3 mL Intravenous Q12H Smith, Rondell A, MD   3 mL at 06/23/24 1100   tamsulosin  (FLOMAX ) capsule 0.4 mg  0.4 mg Oral Daily Claudene Reeves A, MD   0.4 mg at 06/23/24 0811     Objective: Vital signs in last 24 hours: Temp:  [97.7 F (36.5 C)-98.5 F (36.9 C)] 98.5 F (36.9 C) (11/28 1500) Pulse Rate:  [77-86] 77 (11/28 1500) Resp:  [16-17] 16 (11/28 1500) BP: (110-122)/(55-73) 110/62 (11/28 1500) SpO2:  [97 %-100 %] 100 % (11/28 1500)  Intake/Output from previous day: 11/27 0701 - 11/28 0700 In: 905 [I.V.:905] Out: -  Intake/Output this shift: No intake/output data recorded.   Physical Exam Vitals reviewed.  Constitutional:      Appearance: Normal appearance.  Neurological:     Mental Status: He is alert.     Lab Results:  Recent Labs    06/22/24 0259 06/23/24 0439  WBC 9.4 9.4  HGB 7.0* 7.7*  HCT 21.4* 23.8*  PLT 119* 137*   BMET Recent Labs    06/21/24 0307 06/22/24 0259  NA 140 139  K 3.8 3.4*  CL 113* 110  CO2 18* 20*  GLUCOSE 90 99  BUN 32* 29*  CREATININE 1.93*  1.80*  CALCIUM  8.2* 7.9*   PT/INR No results for input(s): LABPROT, INR in the last 72 hours. ABG No results for input(s): PHART, HCO3 in the last 72 hours.  Invalid input(s): PCO2, PO2  Studies/Results: No results found.   Assessment and Plan: Urinary retention.  He continues to refuse a foley catheter.   I offered to arrange to have a suprapubic tube placed but he declined that intervention as well despite letting him know that he is likely to die as a result.   Reconsult prn.       LOS: 5 days    Norleen Seltzer 06/23/2024

## 2024-06-23 NOTE — Progress Notes (Addendum)
 Bladder scan volume 818 cc. Pt says he has no feeling to void. Continues to refuse catheter says he is in right mind has has right to refuse. Offered transfer to bathroom to try and void and urinal, he declined. Will continue to monitor.  Let A. Andrez NP know that pt continues to refuse catheter. She said to tell him retaining large amounts of urine could damage bladder or kidneys. Pt still refuses catheter

## 2024-06-23 NOTE — Progress Notes (Signed)
 Pt bladders scan volume greater than 999 ml. He says he will accept in and out cath but after 8 am. Will continue to monitor.

## 2024-06-24 DIAGNOSIS — R310 Gross hematuria: Secondary | ICD-10-CM | POA: Diagnosis not present

## 2024-06-24 DIAGNOSIS — R296 Repeated falls: Secondary | ICD-10-CM | POA: Diagnosis not present

## 2024-06-24 DIAGNOSIS — N179 Acute kidney failure, unspecified: Secondary | ICD-10-CM | POA: Diagnosis not present

## 2024-06-24 LAB — MAGNESIUM: Magnesium: 1.6 mg/dL — ABNORMAL LOW (ref 1.7–2.4)

## 2024-06-24 MED ORDER — MAGNESIUM SULFATE 2 GM/50ML IV SOLN
2.0000 g | Freq: Once | INTRAVENOUS | Status: AC
  Administered 2024-06-24: 2 g via INTRAVENOUS
  Filled 2024-06-24: qty 50

## 2024-06-24 MED ORDER — LIDOCAINE HCL URETHRAL/MUCOSAL 2 % EX GEL
1.0000 | Freq: Once | CUTANEOUS | Status: AC
  Administered 2024-06-24: 1 via URETHRAL
  Filled 2024-06-24: qty 6

## 2024-06-24 NOTE — Progress Notes (Signed)
 PROGRESS NOTE    Drew Adams  FMW:[id number] DOB: January 16, 1941 DOA: 06/17/2024 PCP: Stacia Millman, PA   Brief Narrative: Drew Adams is a 83 y.o. male with a history of aortic stenosis s/p TAVR, hyperlipidemia, CAD, CKD, BPH, urinary retention.  Patient presented secondary to a fall, suffering a scalp laceration with evidence of UTI and gross hematuria with resultant anemia requiring blood transfusion. Antibiotics started. Urology consulted. Hematuria catheter placed and CBI started however patient developed continued hematuria and recurrent obstruction from clots. Patient went for cystoscopy with clot evacuation on 11/24. CBI held on 11/26.   Assessment and Plan:  Fall Unclear etiology but could possibly related to orthostatic hypotension. Patient with evidence of hypotension overnight while admitted. Likely contributed to by acute anemia from gross hematuria. PT/OT recommending home health therapy on discharge.  Head laceration Secondary to fall. CT head/cervical spine unremarkable for acute process. Laceration sutured by EDP; 4 sutures placed on 11/22. Patient picked some of his sutures off on 11/29. -Remove sutures in 7-10 days from placement.  Leukocytosis Patient did not meet SIRS criteria on admission. Associated lactic acidosis. Likely related to underlying urinary tract infection and possible associated dehydration. WBC improved and leukocytosis resolved.  Hypotension Likely related to acute anemia, rather than infectious etiology. Resolved with IV fluids however patient still has orthostatic hypotension.  Orthostatic hypotension Asymptomatic. Possibly related to anemia. Patient prefers to avoid further blood transfusions and declines a second one at this time. -TED hose -Abdominal binder  Acute urinary retention BPH Patient with recent foley catheter placement for management. Catheter replaced this admission. Urology okay with voiding trial. Foley removed on 11/27  but patient has recurrent urinary retention and is refusing catheter replacement.  Patient continues to hold greater than 1 L of urine. Multiple attempts from me and urology have failed to convince him. He also refuses a suprapubic catheter. He understands the risks of pain, renal failure and death. Family has been updated on patient's decisions as well. -Continue analgesics -Continue tamsulosin  and finasteride  -Palliative care consultation  Gross hematuria Unclear etiology. Possibly related to cystitis versus trauma in setting of underlying BPH and foley placement. Complicated by home Aspirin , in addition to subcutaneous heparin  started this admission for VTE prophylaxis. Patient with associated anemia requiring blood transfusion. Foley catheter in place on admission. Urology consulted and converted patient to a hematuria catheter, starting CBI. Urine continued to show evidence of hematuria. Urology took patient to the OR for cystoscopy and clot evacuation on 11/24. Hematuria is now resolved. CBI discontinued. Okay for foley catheter to be removed, per urology. Foley catehter removed on 11/27.  Acute blood loss anemia Secondary to gross hematuria. Hemoglobin of 10.1 on admission, although more recent baseline appears to be between 8-9. Patient has required 2 units of PRBC via transfusion to date. Hemoglobin is stable.  History of cystitis UTI Presumed hemorrhagic cystitis. Patient treated with antibiotics during prior hospitalization. Cefepime  started this admission. Urine culture obtained and is no growth. Antibiotics discontinued.  AKI on CKD stage 3a Baseline creatinine appear to be around 1.3 from earlier this year, prior to recent urologic issues. Creatinine of 2.26 on admission and has trended up to a peak of 2.42. Concern this is related to obstructive cause, as he had this issue on his most recent admission. Renal ultrasound significant for evidence of no hydronephrosis (post-foley  exchange). Creatinine trending down now. Urine output is complicated by CBI documentation. Creatinine improved to 1.80 before recurrent urinary retention. Creatinine now up to 2.29  today, which is unlikely to improve with ongoing urinary retention -Will avoid rechecking BMP at this time unless patient changes his mind on management  Hypokalemia Potassium supplementation given.  Cognitive communication deficit Speech therapy evaluated and recommend outpatient speech therapy on discharge.  Thrombocytopenia Likely related to acute blood loss. Improved.  Left renal cyst Noted on imaging. Recommendation for follow-up ultrasound in 6 months.  History of TAVR Noted.  CAD Noted. No chest pain. -Hold aspirin  pending urology recommendations; will check final recommendations prior to patient discharge  Goals of care Patient reports that after his wife died, he is not worried about continuing his life as it is. He states that he does not want to be resuscitated if he were to die and does not want to be intubated, should he need it. It may be beneficial for patient to at least complete a MOST form prior to discharge. -Chaplain services for advanced directives -Palliative care consultation  Pressure injury Present on admission. Right buttocks.   DVT prophylaxis: SCDs Code Status:   Code Status: Limited: Do not attempt resuscitation (DNR) -DNR-LIMITED -Do Not Intubate/DNI  Family Communication: Daughter on telephone Disposition Plan: Discharge to SNF pending hopeful insertion of a foley catheter.   Consultants:  Urology  Procedures:  Cystoscopy clot EVAC, urethral dilation complex catheter, placement over wire, bladder calculus evacuation   Antimicrobials: Cefepime   Vancomycin    Subjective: Patient is uncomfortable today. He reports discomfort in his buttock area. He understands his pain is most likely from his bladder urinary retention. He continues to decline foley catheter  placement.  Objective: BP (!) 144/75 (BP Location: Left Arm)   Pulse (!) 105   Temp 97.6 F (36.4 C) (Oral)   Resp 20   Ht 5' 10 (1.778 m)   Wt 81.6 kg   SpO2 100%   BMI 25.83 kg/m   Examination:  General exam: Appears restless and uncomfortable. Respiratory system: Clear to auscultation. Respiratory effort normal. Cardiovascular system: S1 & S2 heard, RRR. Gastrointestinal system: Abdomen is distended in lower abdomen. Central nervous system: Alert and oriented.   Data Reviewed: I have personally reviewed following labs and imaging studies  CBC Lab Results  Component Value Date   WBC 9.4 06/23/2024   RBC 2.53 (L) 06/23/2024   HGB 7.7 (L) 06/23/2024   HCT 23.8 (L) 06/23/2024   MCV 94.1 06/23/2024   MCH 30.4 06/23/2024   PLT 137 (L) 06/23/2024   MCHC 32.4 06/23/2024   RDW 17.0 (H) 06/23/2024   LYMPHSABS 1.1 06/17/2024   MONOABS 1.6 (H) 06/17/2024   EOSABS 0.1 06/17/2024   BASOSABS 0.1 06/17/2024     Last metabolic panel Lab Results  Component Value Date   NA 138 06/23/2024   K 4.2 06/23/2024   CL 111 06/23/2024   CO2 20 (L) 06/23/2024   BUN 34 (H) 06/23/2024   CREATININE 2.29 (H) 06/23/2024   GLUCOSE 117 (H) 06/23/2024   GFRNONAA 28 (L) 06/23/2024   GFRAA 65 (L) 12/30/2012   CALCIUM  8.2 (L) 06/23/2024   PHOS 3.6 06/14/2024   PROT 5.5 (L) 06/17/2024   ALBUMIN  2.9 (L) 06/17/2024   BILITOT 1.9 (H) 06/17/2024   ALKPHOS 57 06/17/2024   AST 18 06/17/2024   ALT 7 06/17/2024   ANIONGAP 7 06/23/2024    GFR: Estimated Creatinine Clearance: 26.1 mL/min (A) (by C-G formula based on SCr of 2.29 mg/dL (H)).  Recent Results (from the past 240 hours)  Blood culture (routine x 2)     Status:  None   Collection Time: 06/17/24  4:53 AM   Specimen: BLOOD RIGHT HAND  Result Value Ref Range Status   Specimen Description BLOOD RIGHT HAND  Final   Special Requests   Final    BOTTLES DRAWN AEROBIC ONLY Blood Culture results may not be optimal due to an inadequate  volume of blood received in culture bottles   Culture   Final    NO GROWTH 5 DAYS Performed at [facility], [address] [place], [place] [address]    Report Status 06/22/2024 FINAL  Final  Blood culture (routine x 2)     Status: None   Collection Time: 06/17/24  6:15 AM   Specimen: BLOOD LEFT ARM  Result Value Ref Range Status   Specimen Description BLOOD LEFT ARM  Final   Special Requests   Final    BOTTLES DRAWN AEROBIC AND ANAEROBIC Blood Culture results may not be optimal due to an inadequate volume of blood received in culture bottles   Culture   Final    NO GROWTH 5 DAYS Performed at [facility], [address] [place], [place] [address]    Report Status 06/22/2024 FINAL  Final  Urine Culture     Status: None   Collection Time: 06/17/24  8:55 AM   Specimen: Urine, Clean Catch  Result Value Ref Range Status   Specimen Description URINE, CLEAN CATCH  Final   Special Requests NONE  Final   Culture   Final    NO GROWTH Performed at [facility], [address] [place], [place] [address]    Report Status 06/19/2024 FINAL  Final      Radiology Studies: No results found.     LOS: 6 days    Elgin Lam, MD [facility] 06/24/2024, 10:20 AM   If 7PM-7AM, please contact night-coverage [email or web address]

## 2024-06-24 NOTE — Progress Notes (Signed)
 Patient in pain with distended bladder. All bladder scans over since 0430 06/23/24. Patient educated multiple times on problems with bladder distention and the risks of not evacuating the accumulated fluid. Patient states he is aware and still adamantly refuses straight cath, foley, or suprapubic. Advised it is our only option to relieve the pain he is experiencing and patient still refuses. Per day nurse, patient refused for her and per Urology note patient refused any catheter placement with them despite possible outcomes of not having catheter placed.  Provided supportive listening, education, and pain medicine to patient, however patient still refusing any form of catheterization to remove urine.

## 2024-06-24 NOTE — Progress Notes (Signed)
 I talked to the patient this morning.  Remains in retention with bladder scans over a liter.  Patient appears uncomfortable but states he is voiding some.  Creatinine is 2.3 which is up from 1.8.  I clearly discussed the possibility of renal failure and progression to death.  Patient adamantly against Foley catheter and states he would rather die than have a Foley catheter.  I recommended Foley catheter and he refused.

## 2024-06-24 NOTE — Consult Note (Addendum)
 Consultation Note Date: 06/24/2024   Patient Name: Drew Adams  DOB: Jun 20, 1941  MRN: [id number]  Age / Sex: 83 y.o., male  PCP: Stacia Millman, PA Referring Physician: Briana Elgin LABOR, MD  Reason for Consultation: Establishing goals of care  HPI/Patient Profile: Drew Adams is a 83 y.o. male with medical history significant of aortic stenosis s/p TAVR 05/30/2024, hyperlipidemia, CAD, chronic kidney disease, BPH with urinary retention presents with recurrent falls and urinary issues.   He continues to have urinary retention.   Clinical Assessment and Goals of Care: Notes and labs reviewed.  In to see patient.  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  He states he moved from [place]  to [place]  when he was younger.  He states he has 2 children, 1 that lives in [place]  and another that lives in [place] .  He discusses that he has been a widow for a little over a year, and was married for over 50 years.  He is able to tell me his name, that we are in [facility] , that Trump is president, the year is 2025, and he is here because of issues with his bladder.    He is somewhat confused and he tells me that we are at a movie theater.  He began to look uncomfortable and readjusted in bed.  Upon inquiring what he was told by other services regarding not accepting a catheter, he states I was told I would not be able to have an orgasm anymore if I did not have the catheter.  He was unable to state any further dangers of not having a catheter, specifically death.  He discusses using Proscar  for enlarged prostate.  He states functionally up until his TAVR on November 4, he was run/walking for around 45 minutes every morning when he got out of bed.  He discusses the initial foley catheter that was placed for retention.  He discusses that upon falling, he pulled the catheter and it  caused a great amount of pain.  He states he does not want to endure this pain again.  With attempting discussion of this admission, he returned to discussing that he used Proscar  for BPH.   We discussed his current retention. We discussed increasing pain, bladder rupture, sepsis, and possible death.  He discusses that when it is time for him to die, he is ready to go.  Inquired what the risk would be of not excepting a Foley catheter, and again he stated I will not be able to have an orgasm.  He was unable to tell me any other danger, and stated he would be optimistic.  We discussed an option of a Foley with leg bag, and with conversation, ultimately he is amenable to this.  Team made aware.  Called and spoke to patient's daughters Drew Adams and Drew Adams via phone on conference call.  Miriam lives in [place]  and Drew Adams lives in [place] .  They confirmed that they are patient's HPOAs.  They discussed the updates that they have been provided  regarding their father, and are able to articulate his status and issues with accuracy.  We discussed his status and conversations that were had with patient.  They state the patient has a known DNR status, and they are supportive of a DNI status as well.  Created space and opportunity for patient  to explore thoughts and feelings regarding current medical information.   A detailed discussion was had today regarding advanced directives.  Concepts specific to code status, artifical feeding and hydration, IV antibiotics and rehospitalization were discussed.  The difference between an aggressive medical intervention path and a comfort care path was discussed.  Values and goals of care important to patient and family were attempted to be elicited.  Discussed limitations of medical interventions to prolong quality of life in some situations and discussed the concept of human mortality.  They understand that patient is ready to die when the time comes.  They do  not want him to suffer.  They are hopeful that patient will continue to be accepting of Foley catheter and SNF placement where they can speak further with him regarding boundaries to care.  They understand that if he has urinary retention with large volumes, and refuses catheter placement or desires discontinuation of Foley catheter what this would mean for his organs and what this process towards end-of-life would look like.  They request a MOST form be left at bedside.  MOST form was left in room.  If patient allows and keeps catheter, they would like to proceed with plans for rehab.  They would like to call PMT if they have questions.  If he does not allow placement of a catheter or requests removal of catheter, PMT will follow with family further.   SUMMARY OF RECOMMENDATIONS   During my visit, patient was unable to restate or discuss risks including death of refusing a Foley catheter.   Patient's 2 daughters are H POA's. PMT will shadow.  If patient allows and keeps Foley catheter, family would like to proceed with SNF placement, and reach out to PMT if they have questions. If patient refuses catheter or request removal, PMT will follow with family further.      Primary Diagnoses: Present on Admission:  Falls  Leukocytosis  Lactic acidosis  Urinary retention due to benign prostatic hyperplasia  Hematuria  History of cystitis  History of transcatheter aortic valve replacement (TAVR)  Normocytic anemia   I have reviewed the medical record, interviewed the patient and family, and examined the patient. The following aspects are pertinent.  Past Medical History:  Diagnosis Date   Aortic stenosis    s/p TAVR on 05/30/24   Aortic valve disease    BPH (benign prostatic hyperplasia)    Diverticulosis of colon without hemorrhage 11/28/2012   ED (erectile dysfunction)    Elevated PSA    Gout    uses prednisone  1-2x/week   Hypertension    Iron deficiency anemia 11/28/2012   Mitral  valve disease    Mixed hyperlipidemia    Ocular migraine    Paraesophageal hiatal hernia 11/28/2012   Radiculopathy    Thoracic aortic aneurysm (TAA)    Social History   Socioeconomic History   Marital status: Widowed    Spouse name: Drew Adams   Number of children: 2   Years of education: Not on file   Highest education level: Bachelor's degree (e.g., BA, AB, BS)  Occupational History   Occupation: retired   Tobacco Use   Smoking status: Never   Smokeless  tobacco: Never  Vaping Use   Vaping status: Never Used  Substance and Sexual Activity   Alcohol use: No   Drug use: No   Sexual activity: Not on file  Other Topics Concern   Not on file  Social History Narrative   Right handed   Caffeine  none   Lives tat home with spouse Drew Adams    Social Drivers of Health   Financial Resource Strain: Not on file  Food Insecurity: No Food Insecurity (06/17/2024)   Hunger Vital Sign    Worried About Running Out of Food in the Last Year: Never true    Ran Out of Food in the Last Year: Never true  Transportation Needs: No Transportation Needs (06/17/2024)   PRAPARE - Administrator, Civil Service (Medical): No    Lack of Transportation (Non-Medical): No  Physical Activity: Not on file  Stress: Not on file  Social Connections: Socially Isolated (06/17/2024)   Social Connection and Isolation Panel    Frequency of Communication with Friends and Family: Once a week    Frequency of Social Gatherings with Friends and Family: Once a week    Attends Religious Services: Never    Database Administrator or Organizations: No    Attends Banker Meetings: Never    Marital Status: Widowed   Family History  Problem Relation Age of Onset   Heart attack Mother    CAD Mother    Parkinson's disease Father    Diabetes Father    Cancer Sister    Diabetes Sister    Scheduled Meds:  allopurinol   300 mg Oral Daily   Chlorhexidine  Gluconate Cloth  6 each Topical Daily    finasteride   5 mg Oral Daily   lidocaine   1 Application Urethral Once   pantoprazole   40 mg Oral Daily   sodium chloride  flush  3 mL Intravenous Q12H   tamsulosin   0.4 mg Oral Daily   Continuous Infusions:  sodium chloride  50 mL/hr at 06/23/24 0121   magnesium  sulfate bolus IVPB     PRN Meds:.acetaminophen  **OR** acetaminophen , albuterol , HYDROcodone -acetaminophen , iohexol , meclizine , ondansetron  **OR** ondansetron  (ZOFRAN ) IV Medications Prior to Admission:  Prior to Admission medications   Medication Sig Start Date End Date Taking? Authorizing Provider  allopurinol  (ZYLOPRIM ) 300 MG tablet Take 300 mg by mouth daily. 11/15/12   [provider]  Ascorbic Acid  (VITAMIN C ) 1000 MG tablet Take 1,000 mg by mouth daily.    [provider]  aspirin  81 MG chewable tablet Chew 1 tablet (81 mg total) by mouth daily. 06/01/24   Sebastian Lamarr SAUNDERS, PA-C  calcium  citrate (CALCITRATE - DOSED IN MG ELEMENTAL CALCIUM ) 950 MG tablet Take 600 mg by mouth daily.    [provider]  cyanocobalamin  (VITAMIN B12) 1000 MCG tablet Take 1,000 mcg by mouth daily.    [provider]  finasteride  (PROSCAR ) 5 MG tablet Take 5 mg by mouth daily.    [provider]  hydrocortisone cream 1 % Apply 1 Application topically daily.    [provider]  MAGNESIUM  GLYCINATE PO Take 400 mg by mouth daily.    [provider]  meclizine  (ANTIVERT ) 25 MG tablet Take 1 tablet (25 mg total) by mouth 3 (three) times daily as needed for dizziness. 02/23/24   Mannie Fairy DASEN, DO  Multiple Vitamin (MULTIVITAMIN WITH MINERALS) TABS Take 1 tablet by mouth daily. Mature    [provider]  OVER THE COUNTER MEDICATION Take 1 tablet  by mouth daily. prostacor    [provider]  oxymetazoline  (AFRIN) 0.05 % nasal spray Place 1 spray into both nostrils 2 (two) times daily. 06/15/24   Singh, Prashant K, MD  pantoprazole  (PROTONIX ) 40 MG tablet Take 1 tablet (40 mg  total) by mouth daily. 06/15/24   Singh, Prashant K, MD  predniSONE  (DELTASONE ) 5 MG tablet Take 5 mg by mouth daily as needed (Gout). 01/07/18   [provider]  psyllium (METAMUCIL) 58.6 % packet Take 1 packet by mouth daily. 10 ml    [provider]  PURE L-ARGININE HCL PO Take 1,800 mg by mouth daily.    [provider]  sodium chloride  (OCEAN) 0.65 % SOLN nasal spray Place 1 spray into both nostrils 4 (four) times daily. 06/15/24   Dennise Lavada POUR, MD  tamsulosin  (FLOMAX ) 0.4 MG CAPS capsule Take 1 capsule (0.4 mg total) by mouth daily. 06/15/24   Singh, Prashant K, MD  Zinc  Gluconate 15 MG TABS Take 50 mg by mouth daily. Plus copper 2 mg    [provider]   Allergies  Allergen Reactions   Crestor  [Rosuvastatin ] Other (See Comments)    Leg pain and cramping   Exforge [Amlodipine Besylate-Valsartan] Other (See Comments)    Myalgia    Simvastatin Other (See Comments)   Review of Systems  Genitourinary:  Positive for decreased urine volume and difficulty urinating.    Physical Exam Pulmonary:     Effort: Pulmonary effort is normal.  Skin:    General: Skin is warm and dry.  Neurological:     Mental Status: He is alert.     Vital Signs: BP (!) 144/75 (BP Location: Left Arm)   Pulse (!) 105   Temp 97.6 F (36.4 C) (Oral)   Resp 20   Ht 5' 10 (1.778 m)   Wt 81.6 kg   SpO2 100%   BMI 25.83 kg/m  Pain Scale: 0-10   Pain Score: 5    SpO2: SpO2: 100 % O2 Device:SpO2: 100 % O2 Flow Rate: .O2 Flow Rate (L/min): 2 L/min  IO: Intake/output summary:  Intake/Output Summary (Last 24 hours) at 06/24/2024 1301 Last data filed at 06/23/2024 1500 Gross per 24 hour  Intake 240 ml  Output --  Net 240 ml    LBM: Last BM Date : 06/21/24 Baseline Weight: Weight: 81.6 kg Most recent weight: Weight: 81.6 kg      Signed by: Camelia Lewis, NP   Please contact Palliative Medicine Team phone at [phone] for questions and concerns.  For  individual provider: See Tracey

## 2024-06-25 DIAGNOSIS — R296 Repeated falls: Secondary | ICD-10-CM | POA: Diagnosis not present

## 2024-06-25 DIAGNOSIS — E872 Acidosis, unspecified: Secondary | ICD-10-CM | POA: Diagnosis not present

## 2024-06-25 DIAGNOSIS — N289 Disorder of kidney and ureter, unspecified: Secondary | ICD-10-CM | POA: Diagnosis not present

## 2024-06-25 DIAGNOSIS — D72829 Elevated white blood cell count, unspecified: Secondary | ICD-10-CM | POA: Diagnosis not present

## 2024-06-25 DIAGNOSIS — Z515 Encounter for palliative care: Secondary | ICD-10-CM

## 2024-06-25 LAB — BASIC METABOLIC PANEL WITH GFR
Anion gap: 10 (ref 5–15)
BUN: 42 mg/dL — ABNORMAL HIGH (ref 8–23)
CO2: 19 mmol/L — ABNORMAL LOW (ref 22–32)
Calcium: 8.3 mg/dL — ABNORMAL LOW (ref 8.9–10.3)
Chloride: 111 mmol/L (ref 98–111)
Creatinine, Ser: 2.98 mg/dL — ABNORMAL HIGH (ref 0.61–1.24)
GFR, Estimated: 20 mL/min — ABNORMAL LOW (ref >60)
Glucose, Bld: 95 mg/dL (ref 70–99)
Potassium: 3.9 mmol/L (ref 3.5–5.1)
Sodium: 140 mmol/L (ref 135–145)

## 2024-06-25 LAB — CBC WITH DIFFERENTIAL/PLATELET
Abs Immature Granulocytes: 0.09 K/uL — ABNORMAL HIGH (ref 0.00–0.07)
Basophils Absolute: 0.1 K/uL (ref 0.0–0.1)
Basophils Relative: 1 %
Eosinophils Absolute: 0.1 K/uL (ref 0.0–0.5)
Eosinophils Relative: 1 %
HCT: 20.1 % — ABNORMAL LOW (ref 39.0–52.0)
Hemoglobin: 6.5 g/dL — CL (ref 13.0–17.0)
Immature Granulocytes: 1 %
Lymphocytes Relative: 17 %
Lymphs Abs: 1.1 K/uL (ref 0.7–4.0)
MCH: 30.8 pg (ref 26.0–34.0)
MCHC: 32.3 g/dL (ref 30.0–36.0)
MCV: 95.3 fL (ref 80.0–100.0)
Monocytes Absolute: 0.9 K/uL (ref 0.1–1.0)
Monocytes Relative: 13 %
Neutro Abs: 4.5 K/uL (ref 1.7–7.7)
Neutrophils Relative %: 67 %
Platelets: 133 K/uL — ABNORMAL LOW (ref 150–400)
RBC: 2.11 MIL/uL — ABNORMAL LOW (ref 4.22–5.81)
RDW: 17.1 % — ABNORMAL HIGH (ref 11.5–15.5)
WBC: 6.7 K/uL (ref 4.0–10.5)
nRBC: 0 % (ref 0.0–0.2)

## 2024-06-25 LAB — HEPATIC FUNCTION PANEL
ALT: 10 U/L (ref 0–44)
AST: 15 U/L (ref 15–41)
Albumin: 2.3 g/dL — ABNORMAL LOW (ref 3.5–5.0)
Alkaline Phosphatase: 47 U/L (ref 38–126)
Bilirubin, Direct: 0.1 mg/dL (ref 0.0–0.2)
Indirect Bilirubin: 0.9 mg/dL (ref 0.3–0.9)
Total Bilirubin: 1 mg/dL (ref 0.0–1.2)
Total Protein: 4.4 g/dL — ABNORMAL LOW (ref 6.5–8.1)

## 2024-06-25 LAB — HEMOGLOBIN AND HEMATOCRIT, BLOOD
HCT: 24.7 % — ABNORMAL LOW (ref 39.0–52.0)
Hemoglobin: 8.1 g/dL — ABNORMAL LOW (ref 13.0–17.0)

## 2024-06-25 LAB — MAGNESIUM: Magnesium: 2.2 mg/dL (ref 1.7–2.4)

## 2024-06-25 LAB — PHOSPHORUS: Phosphorus: 3.6 mg/dL (ref 2.5–4.6)

## 2024-06-25 LAB — PREPARE RBC (CROSSMATCH)

## 2024-06-25 MED ORDER — SODIUM CHLORIDE 0.9% IV SOLUTION: Freq: Once | INTRAVENOUS | Status: AC

## 2024-06-25 NOTE — TOC Progression Note (Signed)
 Transition of Care [facility]) - Progression Note    Patient Details  Name: Drew Adams MRN: [id number] Date of Birth: 1940/11/29  Transition of Care [facility]) CM/SW Contact  Ivis Nicolson A Kynzlie Hilleary, LCSW Phone Number: 06/25/2024, 3:32 PM  Clinical Narrative:     CSW notified [facility] that pt was not stable for DC at this time and will keep facility notified when able to discharge.    CSW will continue to follow.    Expected Discharge Plan: Skilled Nursing Facility Barriers to Discharge: Continued Medical Work up               Expected Discharge Plan and Services In-house Referral: Clinical Social Work Discharge Planning Services: CM Consult Post Acute Care Choice: Home Health, Resumption of Paediatric Nurse, Skilled Nursing Facility Living arrangements for the past 2 months: Single Family Home                 DME Arranged: N/A DME Agency: NA       HH Arranged: PT, OT HH Agency: [facility]         Social Drivers of Health (SDOH) Interventions SDOH Screenings   Food Insecurity: No Food Insecurity (06/17/2024)  Housing: Low Risk  (06/17/2024)  Transportation Needs: No Transportation Needs (06/17/2024)  Utilities: Not At Risk (06/17/2024)  Social Connections: Socially Isolated (06/17/2024)  Tobacco Use: Low Risk  (06/19/2024)    Readmission Risk Interventions     No data to display

## 2024-06-25 NOTE — Plan of Care (Signed)
  Problem: Clinical Measurements: Goal: Respiratory complications will improve Outcome: Progressing Goal: Cardiovascular complication will be avoided Outcome: Progressing   Problem: Coping: Goal: Level of anxiety will decrease Outcome: Progressing   Problem: Elimination: Goal: Will not experience complications related to urinary retention Outcome: Not Progressing

## 2024-06-25 NOTE — Plan of Care (Signed)

## 2024-06-25 NOTE — Progress Notes (Signed)
 Urology Inpatient Progress Report  Falls [R29.6] Recurrent falls [R29.6] Leukocytosis, unspecified type [D72.829]  Procedure(s): CYSTOSCOPY, WITH BLADDER FULGURATION  6 Days Post-Op   Intv/Subj: Patient ultimately agreed to Foley catheter yesterday.  Had some bloody output and nursing irrigated some.  Output seems to be clearing up.  Hemoglobin was 6.5 this morning.  Principal Problem:   Falls Active Problems:   History of transcatheter aortic valve replacement (TAVR)   Urinary retention due to benign prostatic hyperplasia   Leukocytosis   Lactic acidosis   Renal insufficiency   Pressure injury of skin   Hematuria   History of cystitis   Normocytic anemia  Current Facility-Administered Medications  Medication Dose Route Frequency Provider Last Rate Last Admin   0.9 %  sodium chloride  infusion (Manually program via Guardrails IV Fluids)   Intravenous Once Sheikh, Omair Latif, DO       0.9 %  sodium chloride  infusion   Intravenous Continuous Claudene Reeves A, MD 50 mL/hr at 06/23/24 0121 New Bag at 06/23/24 0121   acetaminophen  (TYLENOL ) tablet 650 mg  650 mg Oral Q6H PRN Smith, Rondell A, MD   650 mg at 06/25/24 9088   Or   acetaminophen  (TYLENOL ) suppository 650 mg  650 mg Rectal Q6H PRN Claudene Reeves LABOR, MD       albuterol  (PROVENTIL ) (2.5 MG/3ML) 0.083% nebulizer solution 2.5 mg  2.5 mg Nebulization Q6H PRN Smith, Rondell A, MD       allopurinol  (ZYLOPRIM ) tablet 300 mg  300 mg Oral Daily Claudene, Rondell A, MD   300 mg at 06/25/24 9096   Chlorhexidine  Gluconate Cloth 2 % PADS 6 each  6 each Topical Daily Claudene Reeves A, MD   6 each at 06/25/24 9095   finasteride  (PROSCAR ) tablet 5 mg  5 mg Oral Daily Smith, Rondell A, MD   5 mg at 06/25/24 9096   HYDROcodone -acetaminophen  (NORCO/VICODIN) 5-325 MG per tablet 1 tablet  1 tablet Oral Q4H PRN Briana Elgin LABOR, MD   1 tablet at 06/24/24 0249   iohexol  (OMNIPAQUE ) 350 MG/ML injection 40 mL  40 mL Per Tube Once PRN Showalter,  Victor C, MD       meclizine  (ANTIVERT ) tablet 25 mg  25 mg Oral TID PRN Claudene Reeves LABOR, MD       ondansetron  (ZOFRAN ) tablet 4 mg  4 mg Oral Q6H PRN Claudene Reeves A, MD       Or   ondansetron  (ZOFRAN ) injection 4 mg  4 mg Intravenous Q6H PRN Claudene Reeves A, MD       pantoprazole  (PROTONIX ) EC tablet 40 mg  40 mg Oral Daily Claudene, Rondell A, MD   40 mg at 06/25/24 0903   sodium chloride  flush (NS) 0.9 % injection 3 mL  3 mL Intravenous Q12H Smith, Rondell A, MD   3 mL at 06/25/24 0904   tamsulosin  (FLOMAX ) capsule 0.4 mg  0.4 mg Oral Daily Claudene Reeves A, MD   0.4 mg at 06/25/24 0903     Objective: Vital: Vitals:   06/24/24 1709 06/24/24 2026 06/25/24 0454 06/25/24 0725  BP: (!) 106/56 119/60 (!) 90/45 (!) 110/56  Pulse: 93 95 93 85  Resp: 18 18 18 16   Temp: 98.1 F (36.7 C) 97.9 F (36.6 C) 98 F (36.7 C) 97.8 F (36.6 C)  TempSrc: Oral   Oral  SpO2: 99% 100% 99% 100%  Weight:      Height:       I/Os: I/O last 3  completed shifts: In: 950.3 [I.V.:950.3] Out: 3600 [Urine:3600]  Physical Exam:  General: Patient is in no apparent distress Lungs: Normal respiratory effort, chest expands symmetrically. GI: The abdomen is soft and nontender without mass. Foley: Draining light red urine Ext: lower extremities symmetric  Lab Results: Recent Labs    06/23/24 0439 06/25/24 0858  WBC 9.4 6.7  HGB 7.7* 6.5*  HCT 23.8* 20.1*   Recent Labs    06/23/24 1849 06/25/24 0207  NA 138 140  K 4.2 3.9  CL 111 111  CO2 20* 19*  GLUCOSE 117* 95  BUN 34* 42*  CREATININE 2.29* 2.98*  CALCIUM  8.2* 8.3*   No results for input(s): LABPT, INR in the last 72 hours. No results for input(s): LABURIN in the last 72 hours. Results for orders placed or performed during the hospital encounter of 06/17/24  Blood culture (routine x 2)     Status: None   Collection Time: 06/17/24  4:53 AM   Specimen: BLOOD RIGHT HAND  Result Value Ref Range Status   Specimen Description BLOOD  RIGHT HAND  Final   Special Requests   Final    BOTTLES DRAWN AEROBIC ONLY Blood Culture results may not be optimal due to an inadequate volume of blood received in culture bottles   Culture   Final    NO GROWTH 5 DAYS Performed at [facility], [address] [place], [place] [address]    Report Status 06/22/2024 FINAL  Final  Blood culture (routine x 2)     Status: None   Collection Time: 06/17/24  6:15 AM   Specimen: BLOOD LEFT ARM  Result Value Ref Range Status   Specimen Description BLOOD LEFT ARM  Final   Special Requests   Final    BOTTLES DRAWN AEROBIC AND ANAEROBIC Blood Culture results may not be optimal due to an inadequate volume of blood received in culture bottles   Culture   Final    NO GROWTH 5 DAYS Performed at [facility], [address] [place], [place] [address]    Report Status 06/22/2024 FINAL  Final  Urine Culture     Status: None   Collection Time: 06/17/24  8:55 AM   Specimen: Urine, Clean Catch  Result Value Ref Range Status   Specimen Description URINE, CLEAN CATCH  Final   Special Requests NONE  Final   Culture   Final    NO GROWTH Performed at [facility], [address] [place], [place] [address]    Report Status 06/19/2024 FINAL  Final    Studies/Results: No results found.  Assessment: Urinary retention Gross hematuria  Procedure(s): CYSTOSCOPY, WITH BLADDER FULGURATION, 6 Days Post-Op  doing well.  Plan: Continue Foley catheter.  Nursing may irrigate as needed.  I do not think he needs CBI right now.   Sherwood Edison, MD Urology 06/25/2024, 12:26 PM

## 2024-06-25 NOTE — Progress Notes (Signed)
 PROGRESS NOTE    Tandy Lewin  FMW:[id number] DOB: [birth year] DOA: 06/17/2024 PCP: Stacia Millman, PA   Brief Narrative:  Drew Adams is a 83 y.o. male with a history of aortic stenosis s/p TAVR, hyperlipidemia, CAD, CKD, BPH, urinary retention.  Patient presented secondary to a fall, suffering a scalp laceration with evidence of UTI and gross hematuria with resultant anemia requiring blood transfusion. Antibiotics started. Urology consulted. Hematuria catheter placed and CBI started however patient developed continued hematuria and recurrent obstruction from clots. Patient went for cystoscopy with clot evacuation on 11/24. CBI held on 11/26. Foley removed 11/27 however patient started retaining again. Initially declined reinsertion of foley  Patient was finally agreeable for Foley reinsertion and Foley was inserted on 11/29 and had 1800 mL out and had some bloody urine.  Hemoglobin was 6.5 this morning so we will type and screen and transfuse 2 units of PRBCs.  The urology team recommends to continue the Foley catheter drainage and recommending nursing irrigation and feels that he does not need CBI at this time.  Assessment and Plan:  Fall: Unclear etiology but could possibly related to orthostatic hypotension. Patient with evidence of hypotension overnight while admitted. Likely contributed to by acute anemia from gross hematuria. PT/OT recommending home health therapy on discharge but have now changed recommendations to SNF and patient willing to go to SNF for .   Head Laceration: Secondary to fall. CT head/cervical spine unremarkable for acute process. Laceration sutured by EDP; 4 sutures placed on 11/22. Patient picked some of his sutures off on 11/29. Will need to Remove sutures in 7-10 days from placement.   Leukocytosis: Patient did not meet SIRS criteria on admission. Associated lactic acidosis. Likely related to underlying urinary tract infection and possible associated dehydration.  WBC improved and leukocytosis resolved as current WBC Trend:  Recent Labs  Lab 06/18/24 0546 06/19/24 0811 06/20/24 0338 06/21/24 0307 06/22/24 0259 06/23/24 0439 06/25/24 0858  WBC 21.7* 12.7* 10.7* 13.1* 9.4 9.4 6.7   Hypotension: Likely related to acute anemia, rather than infectious etiology. Resolved with IV fluids however patient still has orthostatic hypotension. Repeat Orthostatic VS in the AM. CTM BP per Protocol. Last BP reading was on the softer side at 109/57   Orthostatic Hypotension: Asymptomatic. Possibly related to anemia. Patient preferred to avoid further blood transfusions but given his Hgb <7 he was agreeable. -C/w TED Hose and Abdominal binder -Repeat Orthostatic VS in the AM    Acute Urinary Retention BPH Patient with recent foley catheter placement for management. Catheter replaced this admission again. Urology initially okay with voiding trial and Foley removed on 11/27 but patient has recurrent urinary retention and was refusing catheter replacement.  Patient continues to hold greater than 1 L of urine. Multiple attempts from Dr. Briana and urology had failed to convince him but finally agreeable and replaced 11/29. Continue analgesics -Continue Tamsulosin  0.4 mg po Daily and Finasteride  5 mg po Daily -Palliative Care Consulted and see below   Gross Hematuria: Unclear etiology. Possibly related to cystitis versus trauma in setting of underlying BPH and foley placement. Complicated by home Aspirin , in addition to subcutaneous heparin  started this admission for VTE prophylaxis. Patient with associated anemia requiring blood transfusions (2 today) Foley catheter in place on admission. Urology consulted and converted patient to a hematuria catheter, starting CBI. Urine continued to show evidence of hematuria. Urology took patient to the OR for cystoscopy and clot evacuation on 11/24. Hematuria had resolved resolved and CBI discontinued. Foley catehter  removed on 11/27  given Urology recommendations but patient continue to Retain so replaced 11/29.   Acute blood loss anemia: Secondary to gross hematuria. Hemoglobin of 10.1 on admission, although more recent baseline appears to be between 8-9. Patient has required 2 units of PRBC via transfusion to date and will receive another 2 units today. Hgb/Hct Trend:  Recent Labs  Lab 06/19/24 [phone] 06/20/24 0338 06/20/24 1832 06/21/24 0307 06/22/24 0259 06/23/24 0439 06/25/24 0858  HGB 6.9* 7.4* 8.6* 7.2* 7.0* 7.7* 6.5*  HCT 20.7* 23.1* 26.3* 22.1* 21.4* 23.8* 20.1*  MCV 92.8 95.1  --  92.9 93.0 94.1 95.3  -CTM for S/Sx of Bleeding; Hematuria is improving and Urine Color Lightening. Repeat CBC in the AM    History of Cystitis UTI Presumed hemorrhagic cystitis. Patient treated with antibiotics during prior hospitalization. Cefepime  started this admission. Urine culture obtained and is no growth. Antibiotics discontinued as he is afebrile now and because Leukocytosis has resolved.    AKI on CKD stage 3a / Metabolic Acidosis: Baseline creatinine appear to be around 1.3 from earlier this year, prior to recent urologic issues. Creatinine of 2.26 on admission and has trended up to a peak of 2.98. BUN/Cr Trend: Recent Labs  Lab 06/18/24 0546 06/19/24 0811 06/20/24 0338 06/21/24 0307 06/22/24 0259 06/23/24 1849 06/25/24 0207  BUN 40* 44* 40* 32* 29* 34* 42*  CREATININE 2.42* 2.33* 1.83* 1.93* 1.80* 2.29* 2.98*  -Concerned this is related to obstructive cause, as he had this issue on his most recent admission. Renal ultrasound significant for evidence of no hydronephrosis (post-foley exchange). Urine output is complicated by CBI documentation. Creatinine had improved to 1.80 before recurrent urinary retention. Foley reinserted 11/29.  Intake/Output Summary (Last 24 hours) at 06/25/2024 1611 Last data filed at 06/25/2024 1100 Gross per 24 hour  Intake 1060.34 ml  Output 2250 ml  Net -1189.66 ml  -Patient has a  slight metabolic acidosis with a CO2 of 19, anion gap of 10, chloride level of 111 -Avoid Nephrotoxic Medications, Contrast Dyes, Hypotension and Dehydration to Ensure Adequate Renal Perfusion and will need to Renally Adjust Meds.  -Continue to Monitor and Trend Renal Function carefully and repeat CMP in the AM    Hypokalemia: Improved. K+ is now 3.9. CTM and Replete as Necessary. Repeat CMP in the AM   Cognitive communication deficit: Speech therapy evaluated and recommend outpatient speech therapy on discharge.   Thrombocytopenia: Likely related to acute blood loss. Improved. Plt Count Trend:  Recent Labs  Lab 06/18/24 0546 06/19/24 0811 06/20/24 0338 06/21/24 0307 06/22/24 0259 06/23/24 0439 06/25/24 0858  PLT 131* 126* 114* 117* 119* 137* 133*  -CTM for S/Sx of Bleeding; Re[eat CBC in the AM   Left renal cyst: Noted on imaging. Recommendation for follow-up ultrasound in 6 months.   History of TAVR: Noted.   CAD: Noted. No chest pain. Hold aspirin  pending urology recommendations; will check final recommendations prior to patient discharge   Goals of care: Patient reports that after his wife died, he is not worried about continuing his life as it is. He states that he does not want to be resuscitated if he were to die and does not want to be intubated, should he need it. It may be beneficial for patient to at least complete a MOST form prior to discharge. Chaplain services for advanced directives. Palliative care consultation and currently he is wanting the DNR limited and his goal of care is medical stabilization and recovery to the extent that  is possible and would like to proceed with SNF placement for rehabilitation at this time  GERD/GI Prophylaxis: C/w PPI Pantoprazole  40 mg po Daily    Pressure injury: Present on admission. Right buttocks.  Wound 06/17/24 1711 Pressure Injury Buttocks Right Stage 2 -  Partial thickness loss of dermis presenting as a shallow open injury with a  red, pink wound bed without slough. (Active)   Hypoalbuminemia: Patient's Albumin  Lvl Trend: Recent Labs  Lab 06/11/24 1720 06/12/24 0636 06/13/24 1022 06/17/24 0413 06/25/24 1044  ALBUMIN  2.8* 2.4* 2.5* 2.9* 2.3*  -CTM & Trend & repeat CMP in the AM   DVT prophylaxis: Place TED hose Start: 06/21/24 1612 Place and maintain sequential compression device Start: 06/19/24 1036    Code Status: Limited: Do not attempt resuscitation (DNR) -DNR-LIMITED -Do Not Intubate/DNI  Family Communication: No family present @ bedside  Disposition Plan:  Level of care: Med-Surg Status is: Inpatient Remains inpatient appropriate because: Needs to go to SNF but currently not medically ready as he is getting 2 units of pRBCs   Consultants:  Urology Palliative Care Medicine  Procedures:  Procedure performed by Dr. Steffan Pea on 11/24: Cystoscopy clot EVAC, urethral dilation complex catheter, placement over wire, bladder calculus evacuation   Antimicrobials:  Anti-infectives (From admission, onward)    Start     Dose/Rate Route Frequency Ordered Stop   06/19/24 0800  vancomycin  (VANCOREADY) IVPB 1500 mg/300 mL  Status:  Discontinued        1,500 mg 150 mL/hr over 120 Minutes Intravenous Every 48 hours 06/17/24 2108 06/21/24 0811   06/18/24 0600  ceFEPIme  (MAXIPIME ) 2 g in sodium chloride  0.9 % 100 mL IVPB  Status:  Discontinued        2 g 200 mL/hr over 30 Minutes Intravenous Every 24 hours 06/17/24 1315 06/21/24 0811   06/17/24 0515  cefTRIAXone  (ROCEPHIN ) 1 g in sodium chloride  0.9 % 100 mL IVPB  Status:  Discontinued        1 g 200 mL/hr over 30 Minutes Intravenous  Once 06/17/24 0510 06/17/24 0510   06/17/24 0515  ceFEPIme  (MAXIPIME ) 2 g in sodium chloride  0.9 % 100 mL IVPB        2 g 200 mL/hr over 30 Minutes Intravenous  Once 06/17/24 0513 06/17/24 0645   06/17/24 0515  vancomycin  (VANCOREADY) IVPB 2000 mg/400 mL        2,000 mg 200 mL/hr over 120 Minutes Intravenous  Once  06/17/24 0513 06/17/24 0855       Subjective: Seen and examined at bedside is resting and Foley was reinserted yesterday.  Has some mild blood-tinged urine output.  Output continues to seem to clear up.  Blood count was low this morning.  He is agreeable for blood transfusion.  No nausea or vomiting.  Feels okay.  No other concerns or complaints at this time.  Objective: Vitals:   06/25/24 0725 06/25/24 1341 06/25/24 1400 06/25/24 1542  BP: (!) 110/56 (!) 104/46 (!) 109/48 (!) 109/57  Pulse: 85 89 88 85  Resp: 16 16 16 16   Temp: 97.8 F (36.6 C) 97.7 F (36.5 C) 98.3 F (36.8 C) 98.4 F (36.9 C)  TempSrc: Oral Oral Oral Oral  SpO2: 100% 99% 100% 99%  Weight:      Height:        Intake/Output Summary (Last 24 hours) at 06/25/2024 1637 Last data filed at 06/25/2024 1100 Gross per 24 hour  Intake 1060.34 ml  Output 2250 ml  Net -  1189.66 ml   Filed Weights   06/17/24 0336  Weight: 81.6 kg   Examination: Physical Exam:  Constitutional: Elderly chronically ill-appearing Caucasian male in no acute distress Respiratory: Diminished to auscultation bilaterally, no wheezing, rales, rhonchi or crackles. Normal respiratory effort and patient is not tachypenic. No accessory muscle use.  Unlabored breathing Cardiovascular: RRR, no murmurs / rubs / gallops. S1 and S2 auscultated.  1+ extremity edema Abdomen: Soft, non-tender, non-distended. Bowel sounds positive.  GU: Deferred.  Foley catheter is in place and has some dark orange urine Musculoskeletal: No clubbing / cyanosis of digits/nails. No joint deformity upper and lower extremities.  Skin: No rashes, lesions, ulcers. No induration; Warm and dry.  Neurologic: CN 2-12 grossly intact with no focal deficits. Romberg sign and cerebellar reflexes not assessed.  Psychiatric: He is awake and alert in no acute distress  Data Reviewed: I have personally reviewed following labs and imaging studies  CBC: Recent Labs  Lab 06/20/24 0338  06/20/24 1832 06/21/24 0307 06/22/24 0259 06/23/24 0439 06/25/24 0858  WBC 10.7*  --  13.1* 9.4 9.4 6.7  NEUTROABS  --   --   --   --   --  4.5  HGB 7.4* 8.6* 7.2* 7.0* 7.7* 6.5*  HCT 23.1* 26.3* 22.1* 21.4* 23.8* 20.1*  MCV 95.1  --  92.9 93.0 94.1 95.3  PLT 114*  --  117* 119* 137* 133*   Basic Metabolic Panel: Recent Labs  Lab 06/20/24 0338 06/21/24 0307 06/22/24 0259 06/23/24 1849 06/24/24 0712 06/25/24 0207 06/25/24 1044  NA 141 140 139 138  --  140  --   K 3.6 3.8 3.4* 4.2  --  3.9  --   CL 114* 113* 110 111  --  111  --   CO2 16* 18* 20* 20*  --  19*  --   GLUCOSE 126* 90 99 117*  --  95  --   BUN 40* 32* 29* 34*  --  42*  --   CREATININE 1.83* 1.93* 1.80* 2.29*  --  2.98*  --   CALCIUM  8.3* 8.2* 7.9* 8.2*  --  8.3*  --   MG  --   --   --  1.6* 1.6* 2.2  --   PHOS  --   --   --   --   --   --  3.6   GFR: Estimated Creatinine Clearance: 20.1 mL/min (A) (by C-G formula based on SCr of 2.98 mg/dL (H)). Liver Function Tests: Recent Labs  Lab 06/25/24 1044  AST 15  ALT 10  ALKPHOS 47  BILITOT 1.0  PROT 4.4*  ALBUMIN  2.3*   No results for input(s): LIPASE, AMYLASE in the last 168 hours. No results for input(s): AMMONIA in the last 168 hours. Coagulation Profile: No results for input(s): INR, PROTIME in the last 168 hours. Cardiac Enzymes: No results for input(s): CKTOTAL, CKMB, CKMBINDEX, TROPONINI in the last 168 hours. BNP (last 3 results) No results for input(s): PROBNP in the last 8760 hours. HbA1C: No results for input(s): HGBA1C in the last 72 hours. CBG: No results for input(s): GLUCAP in the last 168 hours. Lipid Profile: No results for input(s): CHOL, HDL, LDLCALC, TRIG, CHOLHDL, LDLDIRECT in the last 72 hours. Thyroid  Function Tests: No results for input(s): TSH, T4TOTAL, FREET4, T3FREE, THYROIDAB in the last 72 hours. Anemia Panel: No results for input(s): VITAMINB12, FOLATE, FERRITIN,  TIBC, IRON, RETICCTPCT in the last 72 hours. Sepsis Labs: No results for input(s): PROCALCITON, LATICACIDVEN  in the last 168 hours.  Recent Results (from the past 240 hours)  Blood culture (routine x 2)     Status: None   Collection Time: 06/17/24  4:53 AM   Specimen: BLOOD RIGHT HAND  Result Value Ref Range Status   Specimen Description BLOOD RIGHT HAND  Final   Special Requests   Final    BOTTLES DRAWN AEROBIC ONLY Blood Culture results may not be optimal due to an inadequate volume of blood received in culture bottles   Culture   Final    NO GROWTH 5 DAYS Performed at [facility], [address], [place], [place] [address]    Report Status 06/22/2024 FINAL  Final  Blood culture (routine x 2)     Status: None   Collection Time: 06/17/24  6:15 AM   Specimen: BLOOD LEFT ARM  Result Value Ref Range Status   Specimen Description BLOOD LEFT ARM  Final   Special Requests   Final    BOTTLES DRAWN AEROBIC AND ANAEROBIC Blood Culture results may not be optimal due to an inadequate volume of blood received in culture bottles   Culture   Final    NO GROWTH 5 DAYS Performed at [facility], [address], [place], [place] [address]    Report Status 06/22/2024 FINAL  Final  Urine Culture     Status: None   Collection Time: 06/17/24  8:55 AM   Specimen: Urine, Clean Catch  Result Value Ref Range Status   Specimen Description URINE, CLEAN CATCH  Final   Special Requests NONE  Final   Culture   Final    NO GROWTH Performed at [facility], [address] [place], [place] [address]    Report Status 06/19/2024 FINAL  Final    Radiology Studies: No results found.  Scheduled Meds:  sodium chloride    Intravenous Once   allopurinol   300 mg Oral Daily   Chlorhexidine  Gluconate Cloth  6 each Topical Daily   finasteride   5 mg Oral Daily   pantoprazole   40 mg Oral Daily   sodium chloride  flush  3 mL Intravenous Q12H   tamsulosin   0.4 mg Oral Daily    Continuous Infusions:  sodium chloride  50 mL/hr at 06/23/24 0121    LOS: 7 days   Alejandro Marker, DO [facility] Available via Epic secure chat 7am-7pm After these hours, please refer to coverage provider listed on [email or web address] 06/25/2024, 4:37 PM

## 2024-06-25 NOTE — Progress Notes (Signed)
 Palliative Medicine Inpatient Follow Up Note   HPI: 83 year old male with a history of aortic stenosis status post TAVR, hyperlipidemia, CAD, CKD, BPH, urinary retention.  Patient presented secondary to fall, suffering scalp laceration with evidence of UTI and gross hematuria with resultant anemia requiring blood transfusion.  Urology consulted.  Hematuria catheter placed and CBI started however patient developed continued hematuria and recurrent obstruction from clots.  Patient underwent cystoscopy with clot evacuation on 06/19/2024.  CBI held on 11/26.  Today's Discussion 06/25/2024  Chart reviewed.  Reviewed progress note from hospitalist 06/24/2024: Patient is currently being treated for multiple medical problems most significant for acute urinary retention, gross hematuria.  Noted the patient continues to hold greater than 1 L of.  Multiple attempts from urology for suprapubic catheter but he continues to refuse.  Reviewed progress note from urology 06/24/2024, noting that patient continues to retain urine over a liter, noting creatinine is up to 2.3 from 1.8.  Noting that they have discussed that the possibility of renal failure and progression to death, noting that patient adamantly against Foley catheter and states he would rather die than have a Foley catheter placed.  I reviewed notes from PMT dated 06/24/2024: Family updated regarding patient's refusal for catheter placement and understands the desire for discontinuation of Foley catheter would mean organ failure, and this process would eventually lead him towards end-of-life.  The plan was if patient allows and keeps catheter, is for patient to proceed with SNF placement for rehab.  Reviewed MAR over the last 24 hours.  Patient received Norco 06/24/2024 at 0 2:49 AM, no other as needed medication administered.  Vital signs from 06/25/2024 at 7:25 AM reviewed: Temperature 97.8, BP 110/56, pulse rate 85, respiration rate 16, saturating  well at 100% on room air.  Created space and opportunity for patient to explore thoughts feelings and fears regarding current medical situation.  During today's bedside visit, the patient was comfortable in Semi-Fowler's position, alert, oriented, and without signs of distress.  He denied pain or discomfort reported a restful night sleep, the best in recent days.  The Foley catheter inserted yesterday, has improved his comfort significantly.  He no longer experiences a sensation of fullness, and urine is draining well without hematuria.  The patient expressed contentment and has no current needs.  Son-in-law Joe  present at bedside and agrees that patient is much better than yesterday and appreciates the medical interventions in place, including the Foley catheter placement. He did mention that a warm blanket and a caffeine -free ginger ale will make him more tucked-in and comfortable.   Discussed and educated patient regarding foley catheter care and maintenance and it's importance.   Goals of care remains unchanged.  Hoping that patient will continue to be accepting with the Foley catheter, and SNF placement for rehab.  Patient and her family face treatment option decisions, advanced directive decisions and anticipatory care needs.   Questions and concerns addressed   Palliative Support Provided.   Objective Assessment: Vital Signs Vitals:   06/25/24 0454 06/25/24 0725  BP: (!) 90/45 (!) 110/56  Pulse: 93 85  Resp: 18 16  Temp: 98 F (36.7 C) 97.8 F (36.6 C)  SpO2: 99% 100%    Intake/Output Summary (Last 24 hours) at 06/25/2024 0859 Last data filed at 06/25/2024 0500 Gross per 24 hour  Intake 950.34 ml  Output 3600 ml  Net -2649.66 ml   Last Weight  Most recent update: 06/17/2024  3:36 AM    Weight  81.6 kg (180 lb)             Gen:  NAD HEENT: moist mucous membranes CV: Regular rate and rhythm, no murmurs rubs or gallops PULM: Clear to auscultation, pulmonary  effort normal ABD: soft/nontender/nondistended/normal bowel sounds EXT: No edema URO: Noted for Foley catheter in place Neuro: He is alert  SUMMARY OF RECOMMENDATIONS    Code Status: Continue with DNR-limited Goal of care is medical stabilization and recovery to the extent this is possible Family would like to proceed with SNF placement for rehab.  Continue to provide psycho-social and emotional support to patient and family Palliative medicine team will continue to follow.   Symptom Management: Per Primary team Palliative medicine is available to assist as needed.     I personally spent a total of 35 minutes in the care of the patient today including preparing to see the patient, getting/reviewing separately obtained history, performing a medically appropriate exam/evaluation, counseling and educating, documenting clinical information in the EHR, and coordinating care.  ______________________________________________________________________________________ Kathlyne Bolder NP-C [place] Palliative Medicine Team Team Cell Phone: [phone] Please utilize secure chat with additional questions, if there is no response within 30 minutes please call the above phone number  Palliative Medicine Team providers are available by phone from 7am to 7pm daily and can be reached through the team cell phone.  Should this patient require assistance outside of these hours, please call the patient's attending physician.

## 2024-06-26 ENCOUNTER — Ambulatory Visit (HOSPITAL_COMMUNITY)

## 2024-06-26 ENCOUNTER — Ambulatory Visit: Admitting: Physician Assistant

## 2024-06-26 DIAGNOSIS — N289 Disorder of kidney and ureter, unspecified: Secondary | ICD-10-CM | POA: Diagnosis not present

## 2024-06-26 DIAGNOSIS — D72829 Elevated white blood cell count, unspecified: Secondary | ICD-10-CM | POA: Diagnosis not present

## 2024-06-26 DIAGNOSIS — R296 Repeated falls: Secondary | ICD-10-CM | POA: Diagnosis not present

## 2024-06-26 DIAGNOSIS — E872 Acidosis, unspecified: Secondary | ICD-10-CM | POA: Diagnosis not present

## 2024-06-26 LAB — COMPREHENSIVE METABOLIC PANEL WITH GFR
ALT: 10 U/L (ref 0–44)
AST: 15 U/L (ref 15–41)
Albumin: 2.2 g/dL — ABNORMAL LOW (ref 3.5–5.0)
Alkaline Phosphatase: 44 U/L (ref 38–126)
Anion gap: 7 (ref 5–15)
BUN: 38 mg/dL — ABNORMAL HIGH (ref 8–23)
CO2: 23 mmol/L (ref 22–32)
Calcium: 8.2 mg/dL — ABNORMAL LOW (ref 8.9–10.3)
Chloride: 111 mmol/L (ref 98–111)
Creatinine, Ser: 2.16 mg/dL — ABNORMAL HIGH (ref 0.61–1.24)
GFR, Estimated: 30 mL/min — ABNORMAL LOW (ref >60)
Glucose, Bld: 101 mg/dL — ABNORMAL HIGH (ref 70–99)
Potassium: 3.6 mmol/L (ref 3.5–5.1)
Sodium: 141 mmol/L (ref 135–145)
Total Bilirubin: 1 mg/dL (ref 0.0–1.2)
Total Protein: 4.3 g/dL — ABNORMAL LOW (ref 6.5–8.1)

## 2024-06-26 LAB — TYPE AND SCREEN
ABO/RH(D): A POS
Antibody Screen: NEGATIVE
Unit division: 0
Unit division: 0

## 2024-06-26 LAB — BPAM RBC
Blood Product Expiration Date: 202512202359
Blood Product Expiration Date: 202512212359
ISSUE DATE / TIME: 202511301331
ISSUE DATE / TIME: 202511301638
Unit Type and Rh: 6200
Unit Type and Rh: 6200

## 2024-06-26 LAB — CBC WITH DIFFERENTIAL/PLATELET
Abs Immature Granulocytes: 0.03 K/uL (ref 0.00–0.07)
Basophils Absolute: 0 K/uL (ref 0.0–0.1)
Basophils Relative: 1 %
Eosinophils Absolute: 0.2 K/uL (ref 0.0–0.5)
Eosinophils Relative: 3 %
HCT: 26.2 % — ABNORMAL LOW (ref 39.0–52.0)
Hemoglobin: 8.7 g/dL — ABNORMAL LOW (ref 13.0–17.0)
Immature Granulocytes: 0 %
Lymphocytes Relative: 16 %
Lymphs Abs: 1.1 K/uL (ref 0.7–4.0)
MCH: 30.6 pg (ref 26.0–34.0)
MCHC: 33.2 g/dL (ref 30.0–36.0)
MCV: 92.3 fL (ref 80.0–100.0)
Monocytes Absolute: 0.9 K/uL (ref 0.1–1.0)
Monocytes Relative: 13 %
Neutro Abs: 4.6 K/uL (ref 1.7–7.7)
Neutrophils Relative %: 67 %
Platelets: 139 K/uL — ABNORMAL LOW (ref 150–400)
RBC: 2.84 MIL/uL — ABNORMAL LOW (ref 4.22–5.81)
RDW: 17.3 % — ABNORMAL HIGH (ref 11.5–15.5)
WBC: 6.8 K/uL (ref 4.0–10.5)
nRBC: 0 % (ref 0.0–0.2)

## 2024-06-26 LAB — MAGNESIUM: Magnesium: 1.8 mg/dL (ref 1.7–2.4)

## 2024-06-26 LAB — PHOSPHORUS: Phosphorus: 4 mg/dL (ref 2.5–4.6)

## 2024-06-26 MED ORDER — ALBUTEROL SULFATE (2.5 MG/3ML) 0.083% IN NEBU: 2.5000 mg | INHALATION_SOLUTION | Freq: Four times a day (QID) | RESPIRATORY_TRACT | Status: AC | PRN

## 2024-06-26 MED ORDER — HYDROCODONE-ACETAMINOPHEN 5-325 MG PO TABS: 1.0000 | ORAL_TABLET | Freq: Four times a day (QID) | ORAL | 0 refills | Status: AC | PRN

## 2024-06-26 MED ORDER — ACETAMINOPHEN 325 MG PO TABS: 650.0000 mg | ORAL_TABLET | Freq: Four times a day (QID) | ORAL | Status: AC | PRN

## 2024-06-26 MED ORDER — ONDANSETRON HCL 4 MG PO TABS: 4.0000 mg | ORAL_TABLET | Freq: Four times a day (QID) | ORAL | Status: AC | PRN

## 2024-06-26 MED ORDER — TAMSULOSIN HCL 0.4 MG PO CAPS: 0.8000 mg | ORAL_CAPSULE | Freq: Every day | ORAL | Status: AC

## 2024-06-26 MED ORDER — MAGNESIUM SULFATE 2 GM/50ML IV SOLN
2.0000 g | Freq: Once | INTRAVENOUS | Status: AC
  Administered 2024-06-26: 2 g via INTRAVENOUS
  Filled 2024-06-26: qty 50

## 2024-06-26 NOTE — TOC Transition Note (Signed)
 Transition of Care [facility]) - Discharge Note   Patient Details  Name: Drew Adams MRN: [id number] Date of Birth: 1941/07/02  Transition of Care [facility] CM/SW Contact:  Bridget Cordella Simmonds, LCSW Phone Number: 06/26/2024, 1:31 PM   Clinical Narrative:   Pt will DC to [facility], room 503a. RN report to [phone].   Pt son in law will transport.  Pt will need to be brought down to main north tower entrance with assistance getting into the vehicle.  0845: CSW confirmed with [facility] that they can receive pt today.   Final next level of care: Skilled Nursing Facility Barriers to Discharge: Barriers Resolved   Patient Goals and CMS Choice Patient states their goals for this hospitalization and ongoing recovery are:: wants to get better and stronger CMS Medicare.gov Compare Post Acute Care list provided to:: Patient Choice offered to / list presented to : Adult Children, Patient      Discharge Placement              Patient chooses bed at: [facility] Patient to be transferred to facility by: son in law Name of family member notified: daughter Eva Patient and family notified of of transfer: 06/26/24  Discharge Plan and Services Additional resources added to the After Visit Summary for   In-house Referral: Clinical Social Work Discharge Planning Services: CM Consult Post Acute Care Choice: Home Health, Resumption of Paediatric Nurse, Skilled Nursing Facility          DME Arranged: N/A DME Agency: NA       HH Arranged: PT, OT HH Agency: [facility]        Social Drivers of Health (SDOH) Interventions SDOH Screenings   Food Insecurity: No Food Insecurity (06/17/2024)  Housing: Low Risk  (06/17/2024)  Transportation Needs: No Transportation Needs (06/17/2024)  Utilities: Not At Risk (06/17/2024)  Social Connections: Socially Isolated (06/17/2024)  Tobacco Use: Low Risk  (06/19/2024)     Readmission Risk Interventions     No data to display

## 2024-06-26 NOTE — Progress Notes (Signed)
 Report called to [facility], LPN, at [facility] [phone].  Pt will be going into Room 503A.  No questions or concerns voiced.

## 2024-06-26 NOTE — Progress Notes (Signed)
 Patient ID: Drew Adams, male   DOB: [birth year], 83 y.o.   MRN: [id number]  7 Days Post-Op Subjective: Failed voiding trial over the holiday weekend.  He initially refused cathter replacement despite > 1000 PVR but due to rising Cr, he eventually accepted catheter replacement.   Objective: Vital signs in last 24 hours: Temp:  [97.7 F (36.5 C)-98.8 F (37.1 C)] 98.3 F (36.8 C) (12/01 0342) Pulse Rate:  [82-89] 82 (12/01 0342) Resp:  [16-18] 16 (11/30 1917) BP: (104-119)/(46-60) 111/56 (12/01 0342) SpO2:  [96 %-100 %] 98 % (12/01 0342)  Intake/Output from previous day: 11/30 0701 - 12/01 0700 In: 1315.8 [P.O.:240; Blood:1075.8] Out: 1550 [Urine:1550] Intake/Output this shift: No intake/output data recorded.  Physical Exam:  General: Alert and oriented GU: Urine with minimal hematuria at this time  Lab Results: Recent Labs    06/25/24 0858 06/25/24 2332 06/26/24 0601  HGB 6.5* 8.1* 8.7*  HCT 20.1* 24.7* 26.2*   BMET Recent Labs    06/25/24 0207 06/26/24 0601  NA 140 141  K 3.9 3.6  CL 111 111  CO2 19* 23  GLUCOSE 95 101*  BUN 42* 38*  CREATININE 2.98* 2.16*  CALCIUM  8.3* 8.2*     Studies/Results: No results found.  Assessment/Plan: 1) Urinary retention: Continue tamsulosin  0.8 mg.  He will need to keep his catheter for the duration of his hospitalization and f/u as an outpatient to discuss options for management.  Follow renal function.  Would expect improvement again back to baseline with cathter in place. 2) Hematuria: No worrisome source for hematuria based on prior cystoscopic procedures.  Now resolving.  Hgb stabilizing.    LOS: 8 days   Drew Adams 06/26/2024, 7:23 AM

## 2024-06-26 NOTE — Plan of Care (Signed)

## 2024-06-26 NOTE — Discharge Summary (Signed)
 Physician Discharge Summary   Patient: Drew Adams MRN: [id number] DOB: August 01, 1940  Admit date:     06/17/2024  Discharge date: 06/26/24  Discharge Physician: Alejandro Marker, DO   PCP: Stacia Millman, PA   Recommendations at discharge:   Follow-up with PCP within 1 to 2 weeks repeat CBC, CMP, mag, Phos within 1 week Follow-up with palliative care in outpatient setting and continue to have goals of care discussion Follow-up with urology in the outpatient setting and have a trial of void done in clinic Repeat renal ultrasound in 6 months in the outpatient setting for Left Renal Cyst Removed head laceration sutures in the next 3 to 4 days  Discharge Diagnoses: Principal Problem:   Falls Active Problems:   Leukocytosis   Lactic acidosis   Urinary retention due to benign prostatic hyperplasia   Renal insufficiency   Hematuria   History of cystitis   History of transcatheter aortic valve replacement (TAVR)   Normocytic anemia   Pressure injury of skin  Resolved Problems:   * No resolved hospital problems. *  Hospital Course: Drew Adams is a 83 y.o. male with a history of aortic stenosis s/p TAVR, hyperlipidemia, CAD, CKD, BPH, urinary retention.  Patient presented secondary to a fall, suffering a scalp laceration with evidence of UTI and gross hematuria with resultant anemia requiring blood transfusion. Antibiotics started. Urology consulted. Hematuria catheter placed and CBI started however patient developed continued hematuria and recurrent obstruction from clots. Patient went for cystoscopy with clot evacuation on 11/24. CBI held on 11/26. Foley removed 11/27 however patient started retaining again. Initially declined reinsertion of foley  Patient was finally agreeable for Foley reinsertion and Foley was inserted on 11/29 and had 1800 mL out and had some bloody urine.  Hemoglobin was 6.5 this morning so we will type and screen and transfuse 2 units of PRBCs.  The urology team  recommends to continue the Foley catheter drainage and recommending nursing irrigation and feels that he does not need CBI at this time.  His numbers are improved and he has been stabilized.  Urology has signed off the case and he is medically stable for discharge at this time with outpatient follow-up and will need palliative care continued goals of care discussion outpatient setting.  Given his stability he will be discharged with a Foley with outpatient trial of void based on urology recommendations.  Assessment and Plan:  Fall: Unclear etiology but could possibly related to orthostatic hypotension. Patient with evidence of hypotension overnight while admitted. Likely contributed to by acute anemia from gross hematuria. PT/OT initially recommending home health therapy on discharge but have now changed recommendations to SNF and agreeable for SNF and medically stable   Head Laceration: Secondary to fall. CT head/cervical spine unremarkable for acute process. Laceration sutured by EDP; 4 sutures placed on 11/22. Patient picked some of his sutures off on 11/29. Will need to Remove sutures in 7-10 days from placement.   Leukocytosis: Patient did not meet SIRS criteria on admission. Associated lactic acidosis. Likely related to underlying urinary tract infection and possible associated dehydration. WBC improved and leukocytosis resolved as current WBC Trend:  Recent Labs  Lab 06/19/24 0811 06/20/24 0338 06/21/24 0307 06/22/24 0259 06/23/24 0439 06/25/24 0858 06/26/24 0601  WBC 12.7* 10.7* 13.1* 9.4 9.4 6.7 6.8   Hypotension: Likely related to acute anemia, rather than infectious etiology. Resolved with IV fluids however patient still has orthostatic hypotension. Repeat Orthostatic VS in the AM. CTM BP per Protocol. Last BP  reading is now normalized to 122/61   Orthostatic Hypotension: Asymptomatic. Possibly related to anemia. Patient preferred to avoid further blood transfusions but given his  Hgb <7 he was agreeable. -C/w TED Hose and Abdominal binder -Repeat Orthostatic VS closely and continue to monitor at SNF   Acute Urinary Retention BPH Patient with recent foley catheter placement for management. Catheter replaced this admission again. Urology initially okay with voiding trial and Foley removed on 11/27 but patient has recurrent urinary retention and was refusing catheter replacement.  Patient continues to hold greater than 1 L of urine. Multiple attempts from Dr. Briana and urology had failed to convince him but finally agreeable and replaced 11/29. Continue analgesics -Continue Tamsulosin  but increased from 0.4 mg po Daily to 0.8 mg p.o. daily per urology recommendations.  Continue Finasteride  5 mg po Daily -Palliative Care Consulted and see below   Gross Hematuria: Unclear etiology. Possibly related to cystitis versus trauma in setting of underlying BPH and foley placement. Complicated by home Aspirin , in addition to subcutaneous heparin  started this admission for VTE prophylaxis. Patient with associated anemia requiring blood transfusions (2 today) Foley catheter in place on admission. Urology consulted and converted patient to a hematuria catheter, starting CBI. Urine continued to show evidence of hematuria. Urology took patient to the OR for cystoscopy and clot evacuation on 11/24. Hematuria had resolved resolved and CBI discontinued. Foley catehter removed on 11/27 given Urology recommendations but patient continue to Retain so replaced 11/29.  Outpatient follow-up with urology in 1 to 2 weeks   Acute blood loss anemia: Secondary to gross hematuria. Hemoglobin of 10.1 on admission, although more recent baseline appears to be between 8-9. Patient has required 2 units of PRBC via transfusion to date and will receive another 2 units today. Hgb/Hct Trend:  Recent Labs  Lab 06/20/24 1832 06/21/24 0307 06/22/24 0259 06/23/24 0439 06/25/24 0858 06/25/24 2332 06/26/24 0601  HGB  8.6* 7.2* 7.0* 7.7* 6.5* 8.1* 8.7*  HCT 26.3* 22.1* 21.4* 23.8* 20.1* 24.7* 26.2*  MCV  --  92.9 93.0 94.1 95.3  --  92.3  -CTM for S/Sx of Bleeding; Hematuria is improving and Urine Color Lightening. Repeat CBC in the AM    History of Cystitis UTI Presumed hemorrhagic cystitis. Patient treated with antibiotics during prior hospitalization. Cefepime  started this admission. Urine culture obtained and is no growth. Antibiotics discontinued as he is afebrile now and because Leukocytosis has resolved.    AKI on CKD stage 3a / Metabolic Acidosis: Baseline creatinine appear to be around 1.3 from earlier this year, prior to recent urologic issues. Creatinine of 2.26 on admission and has trended up to a peak of 2.98. BUN/Cr Trend: Recent Labs  Lab 06/19/24 0811 06/20/24 0338 06/21/24 0307 06/22/24 0259 06/23/24 1849 06/25/24 0207 06/26/24 0601  BUN 44* 40* 32* 29* 34* 42* 38*  CREATININE 2.33* 1.83* 1.93* 1.80* 2.29* 2.98* 2.16*  -Concerned this is related to obstructive cause, as he had this issue on his most recent admission. Renal ultrasound significant for evidence of no hydronephrosis (post-foley exchange). Urine output is complicated by CBI documentation. Creatinine had improved to 1.80 before recurrent urinary retention. Foley reinserted 11/29.  Intake/Output Summary (Last 24 hours) at 06/26/2024 1305 Last data filed at 06/26/2024 0900 Gross per 24 hour  Intake 1315.83 ml  Output 1550 ml  Net -234.17 ml  -Patient had a slight metabolic acidosis with a CO2 of 19, anion gap of 10, chloride level of 111 but this is now improved as CO2  is 23, anion gap is 7 and chloride level is 111 -Avoid Nephrotoxic Medications, Contrast Dyes, Hypotension and Dehydration to Ensure Adequate Renal Perfusion and will need to Renally Adjust Meds.  -Continue to Monitor and Trend Renal Function carefully and repeat CMP in the AM    Hypokalemia: Improved. K+ is now 3.6. CTM and Replete as Necessary. Repeat CMP in  the AM   Cognitive communication deficit: Speech therapy evaluated and recommend outpatient speech therapy on discharge.   Thrombocytopenia: Likely related to acute blood loss. Improved. Plt Count Trend:  Recent Labs  Lab 06/19/24 [phone] 06/20/24 0338 06/21/24 0307 06/22/24 0259 06/23/24 0439 06/25/24 0858 06/26/24 0601  PLT 126* 114* 117* 119* 137* 133* 139*  -CTM for S/Sx of Bleeding; Repeat CBC within 1 week   Left renal cyst: Noted on imaging. Recommendation for follow-up ultrasound in 6 months.   History of TAVR: Noted.   CAD: Noted. No chest pain. Hold aspirin  pending urology recommendations; will check final recommendations prior to patient discharge   Goals of care: Patient reports that after his wife died, he is not worried about continuing his life as it is. He states that he does not want to be resuscitated if he were to die and does not want to be intubated, should he need it. It may be beneficial for patient to at least complete a MOST form prior to discharge. Chaplain services for advanced directives. Palliative care consultation and currently he is wanting the DNR limited and his goal of care is medical stabilization and recovery to the extent that is possible and would like to proceed with SNF placement for rehabilitation at this time  GERD/GI Prophylaxis: C/w PPI Pantoprazole  40 mg po Daily    Pressure injury: Present on admission. Right buttocks.  Wound 06/17/24 1711 Pressure Injury Buttocks Right Stage 2 -  Partial thickness loss of dermis presenting as a shallow open injury with a red, pink wound bed without slough. (Active)   Hypoalbuminemia: Patient's Albumin  Lvl Trend: Recent Labs  Lab 06/11/24 1720 06/12/24 0636 06/13/24 1022 06/17/24 0413 06/25/24 1044 06/26/24 0601  ALBUMIN  2.8* 2.4* 2.5* 2.9* 2.3* 2.2*  -CTM & Trend & repeat CMP in the AM  Consultants: Urology, palliative care medicine Procedures performed: As delineated as above  Disposition:  Skilled nursing facility Diet recommendation:  Discharge Diet Orders (From admission, onward)     Start     Ordered   06/26/24 0000  Diet - low sodium heart healthy        06/26/24 1142           Cardiac diet DISCHARGE MEDICATION: Allergies as of 06/26/2024       Reactions   Crestor  [rosuvastatin ] Other (See Comments)   Leg pain and cramping   Exforge [amlodipine Besylate-valsartan] Other (See Comments)   Myalgia    Simvastatin Other (See Comments)        Medication List     STOP taking these medications    cephALEXin  500 MG capsule Commonly known as: KEFLEX    OVER THE COUNTER MEDICATION       TAKE these medications    acetaminophen  325 MG tablet Commonly known as: TYLENOL  Take 2 tablets (650 mg total) by mouth every 6 (six) hours as needed for mild pain (pain score 1-3) or fever (or Fever >/= 101).   albuterol  (2.5 MG/3ML) 0.083% nebulizer solution Commonly known as: PROVENTIL  Take 3 mLs (2.5 mg total) by nebulization every 6 (six) hours as needed for wheezing or shortness  of breath.   allopurinol  300 MG tablet Commonly known as: ZYLOPRIM  Take 300 mg by mouth daily.   aspirin  81 MG chewable tablet Chew 1 tablet (81 mg total) by mouth daily.   calcium  citrate 950 (200 Ca) MG tablet Commonly known as: CALCITRATE - dosed in mg elemental calcium  Take 600 mg by mouth daily.   cyanocobalamin  1000 MCG tablet Commonly known as: VITAMIN B12 Take 1,000 mcg by mouth daily.   Deep Sea Nasal Spray 0.65 % nasal spray Generic drug: sodium chloride  Place 1 spray into both nostrils 4 (four) times daily.   finasteride  5 MG tablet Commonly known as: PROSCAR  Take 5 mg by mouth daily.   HYDROcodone -acetaminophen  5-325 MG tablet Commonly known as: NORCO/VICODIN Take 1 tablet by mouth every 6 (six) hours as needed for up to 3 days for severe pain (pain score 7-10).   hydrocortisone cream 1 % Apply 1 Application topically daily.   MAGNESIUM  GLYCINATE PO Take  400 mg by mouth daily.   meclizine  25 MG tablet Commonly known as: ANTIVERT  Take 1 tablet (25 mg total) by mouth 3 (three) times daily as needed for dizziness.   multivitamin with minerals Tabs tablet Take 1 tablet by mouth daily. Mature   ondansetron  4 MG tablet Commonly known as: ZOFRAN  Take 1 tablet (4 mg total) by mouth every 6 (six) hours as needed for nausea.   oxymetazoline  0.05 % nasal spray Commonly known as: AFRIN Place 1 spray into both nostrils 2 (two) times daily.   pantoprazole  40 MG tablet Commonly known as: PROTONIX  Take 1 tablet (40 mg total) by mouth daily.   predniSONE  5 MG tablet Commonly known as: DELTASONE  Take 5 mg by mouth daily as needed (Gout).   psyllium 58.6 % packet Commonly known as: METAMUCIL Take 1 packet by mouth daily. 10 ml   PURE L-ARGININE HCL PO Take 1,800 mg by mouth daily.   tamsulosin  0.4 MG Caps capsule Commonly known as: FLOMAX  Take 2 capsules (0.8 mg total) by mouth daily. What changed: how much to take   vitamin C  1000 MG tablet Take 1,000 mg by mouth daily.   Zinc  Gluconate 15 MG Tabs Take 50 mg by mouth daily. Plus copper 2 mg               Discharge Care Instructions  (From admission, onward)           Start     Ordered   06/26/24 0000  Discharge wound care:       Comments: C/w Foam Dressing to the Buttocks   06/26/24 1142            Contact information for follow-up providers     Cunningham, Alexus, PA Follow up.   Specialty: Physician Assistant Contact information: [address] [place] [address] [phone]         [facility] Follow up.   Specialty: Home Health Services Contact information: [address] [place] [address] [phone]              Contact information for after-discharge care     Destination     [facility] .   Service: Skilled Nursing Contact information: [address] [place]   [address] [phone]                    Discharge Exam: Fredricka Weights   06/17/24 0336  Weight: 81.6 kg   Vitals:  06/26/24 0342 06/26/24 0750  BP: (!) 111/56 122/61  Pulse: 82 73  Resp:  16  Temp: 98.3 F (36.8 C) 98.9 F (37.2 C)  SpO2: 98% 97%   Examination: Physical Exam:  Constitutional: Elderly chronically ill-appearing Caucasian male in no acute distress Respiratory: Diminished to auscultation bilaterally, no wheezing, rales, rhonchi or crackles. Normal respiratory effort and patient is not tachypenic. No accessory muscle use.  Unlabored breathing Cardiovascular: RRR, no murmurs / rubs / gallops. S1 and S2 auscultated.  Has mild 1+ extremity edema noted Abdomen: Soft, non-tender, non-distended. Bowel sounds positive.  GU: Deferred.  Foley catheter is in place Musculoskeletal: No clubbing / cyanosis of digits/nails. No joint deformity upper and lower extremities.  Skin: No rashes, lesions, ulcers on limited skin evaluation but does have a head laceration that is covered. No induration; Warm and dry.  Neurologic: CN 2-12 grossly intact with no focal deficits. Romberg sign and cerebellar reflexes not assessed.  Psychiatric: He is awake and alert in no acute distress  Condition at discharge: stable  The results of significant diagnostics from this hospitalization (including imaging, microbiology, ancillary and laboratory) are listed below for reference.   Imaging Studies: CT CYSTOGRAM PELVIS Result Date: 06/19/2024 CLINICAL DATA:  Abdominal abscess.  Pain. EXAM: CT CYSTOGRAM (CT PELVIS WITH CONTRAST) TECHNIQUE: Multidetector CT imaging through the pelvis was performed after dilute contrast had been introduced into the bladder for the purposes of performing CT cystography. RADIATION DOSE REDUCTION: This exam was performed according to the departmental dose-optimization program which includes automated exposure control, adjustment of the mA and/or kV according to  patient size and/or use of iterative reconstruction technique. CONTRAST:  40 cc Omnipaque  300 COMPARISON:  CT abdomen pelvis dated 06/18/2024. FINDINGS: Urinary Tract: Large amount of high attenuating content noted within the urinary bladder most consistent with blood products/clot. Multiple calcifications within the bladder and associated with the blood clot measure up to 1 cm. There is a Foley catheter within the urinary bladder. Small amount of air within the bladder introduced via the catheter. There is mild trabeculated appearance of the bladder wall suggestive of chronic bladder outlet obstruction. No extraluminal contrast. Bowel:  Unremarkable visualized pelvic bowel loops. Vascular/Lymphatic: Moderate atherosclerotic calcification of the visualized aorta and iliac arteries. No pelvic adenopathy. Reproductive: Enlarged prostate gland measuring 6.2 cm in transverse axial diameter. Other:  None Musculoskeletal: Osteopenia with degenerative changes. No acute osseous pathology. IMPRESSION: 1. Large amount of blood products/clot within the urinary bladder. No extraluminal contrast. 2. Enlarged prostate gland with findings of chronic bladder outlet obstruction. 3.  Aortic Atherosclerosis (ICD10-I70.0). Electronically Signed   By: Vanetta Chou M.D.   On: 06/19/2024 13:45   US  RENAL Result Date: 06/18/2024 EXAM: US  Retroperitoneum Complete, Renal. 06/18/2024 11:26:22 PM TECHNIQUE: Real-time ultrasonography of the retroperitoneum renal was performed. COMPARISON: None available CLINICAL HISTORY: Acute kidney injury. FINDINGS: FINDINGS: RIGHT KIDNEY/URETER: Right kidney measures 8.6 x 6.1 x 6.6 cm. Normal cortical echogenicity. Lower pole poorly visualized due to overlying bowel gas. LEFT KIDNEY/URETER: Left kidney measures 10.3 x 5.9 x 5.7 cm. Normal cortical echogenicity. A somewhat lobulated cystic lesion is noted in the lower pole of the left kidney, measuring 3.6x3.0x3.1 cm. It is difficult to assess  whether this represents a mildly complex cyst or two adjacent simple cysts. BLADDER: Unremarkable appearance of the bladder. IMPRESSION: 1. No hydronephrosis is noted. 2. Cystic lesion as described in the lower pole of the left kidney. It is difficult to assess whether this represents two adjacent cysts  or one larger somewhat lobulated cystic lesion. The former is favored given the appearance on prior CT from 04/11/2024. A follow up ultrasound in 6 months would be helpful to assess for stability. Electronically signed by: Oneil Devonshire MD 06/18/2024 11:39 PM EST RP Workstation: [id number]   CT Cervical Spine Wo Contrast Result Date: 06/17/2024 EXAM: CT CERVICAL SPINE WITHOUT CONTRAST 06/17/2024 04:04:20 AM TECHNIQUE: CT of the cervical spine was performed without the administration of intravenous contrast. Multiplanar reformatted images are provided for review. Automated exposure control, iterative reconstruction, and/or weight based adjustment of the mA/kV was utilized to reduce the radiation dose to as low as reasonably achievable. COMPARISON: Head CT 06/17/2024, reported separately. CLINICAL HISTORY: 83 year old male with multiple recent falls. FINDINGS: CERVICAL SPINE: BONES AND ALIGNMENT: Straightening of cervical lordosis. No acute fracture or traumatic malalignment. Mineralization is heterogeneous but within normal limits for age. Previously intact visible upper thoracic levels. DEGENERATIVE CHANGES: Degenerative cervical ankylosis at the C4-C5 and C6-C7 levels. Bulky chronic craniocervical junction and anterior C1-C2 degeneration with osteophytosis and sclerosis there. Widespread chronic cervical facet arthropathy. SOFT TISSUES: No prevertebral soft tissue swelling. Mild for age calcified atherosclerosis of the cervical carotid arteries. VISUALIZED LUNG APICES: Trace layering pleural fluid with otherwise negative visible lung apices. IMPRESSION: 1. No acute traumatic injury identified in the cervical  spine. 2. Cervical spine degeneration superimposed on Degenerative ankylosis of C4-C5 and C6-C7. 3. Trace layering pleural effusions. Electronically signed by: Helayne Hurst MD 06/17/2024 04:16 AM EST RP Workstation: [id number]   CT Head Wo Contrast Result Date: 06/17/2024 EXAM: CT HEAD WITHOUT CONTRAST 06/17/2024 04:04:20 AM TECHNIQUE: CT of the head was performed without the administration of intravenous contrast. Automated exposure control, iterative reconstruction, and/or weight based adjustment of the mA/kV was utilized to reduce the radiation dose to as low as reasonably achievable. COMPARISON: Brain MRI 10/14/2018. Head CT 09/02/2018. CLINICAL HISTORY: 83 year old male with ataxia, head trauma, recent falls, and scalp lacerations. FINDINGS: BRAIN AND VENTRICLES: No acute hemorrhage. No evidence of acute infarct. No hydrocephalus. No extra-axial collection. No mass effect or midline shift. Brain volume and chronic subarachnoid CSF in the setting of the volume loss have not significantly changed. Chronic calcified left MCA branch atherosclerosis. Small area of chronic appearing cortical encephalomalacia at the posterior left frontal operculum is new since 2020. Small superior left perirolandic cortex encephalomalacia on previous MRI redemonstrated on series 3 image 9. Elsewhere gray white differentiation is within normal limits for age. No cerebral edema identified. No suspicious intracranial vascular hyperdensity. Progressed chronic small to medium sized vessel ischemia in the left MCA territory since 2020. ORBITS: No acute abnormality. SINUSES: Paranasal sinuses, middle ears and mastoids remain well aerated. SOFT TISSUES AND SKULL: Scalp lacerations. No skull fracture. Chronic craniocervical junction degeneration. Calcified atherosclerosis at the skull base. IMPRESSION: 1. No acute traumatic injury identified. 2. No acute intracranial abnormality. Progressed chronic small to medium sized vessel ischemia in  the left MCA territory since 2020. Electronically signed by: Helayne Hurst MD 06/17/2024 04:13 AM EST RP Workstation: [id number]   DG Pelvis Portable Result Date: 06/17/2024 EXAM: 1 VIEW(S) XRAY OF THE PELVIS 06/17/2024 03:54:21 AM COMPARISON: None available. CLINICAL HISTORY: fall x2 FINDINGS: BONES AND JOINTS: No acute fracture. No joint dislocation. Mild bilateral hip DJD. Degenerative changes in the lumbar spine. SOFT TISSUES: Right pelvic phleboliths. Femoral arterial calcifications. IMPRESSION: 1. No acute osseous abnormality related to recent falls. Electronically signed by: Oneil Devonshire MD 06/17/2024 04:00 AM EST RP Workstation: [id number]   DG Chest  Port 1 View Result Date: 06/17/2024 EXAM: 1 VIEW(S) XRAY OF THE CHEST 06/17/2024 03:52:22 AM COMPARISON: 05/26/2024 CLINICAL HISTORY: fall x2 FINDINGS: LUNGS AND PLEURA: No focal pulmonary opacity. No pleural effusion. No pneumothorax. HEART AND MEDIASTINUM: Tortuous thoracic aorta. Aneurysmal ascending thoracic aorta. TAVR noted. BONES AND SOFT TISSUES: Thoracic dextroscoliosis. IMPRESSION: 1. No acute cardiopulmonary process. Electronically signed by: Oneil Devonshire MD 06/17/2024 03:59 AM EST RP Workstation: [id number]   CT Renal Stone Study Result Date: 06/11/2024 EXAM: CT ABDOMEN AND PELVIS WITHOUT CONTRAST 06/11/2024 10:39:57 PM TECHNIQUE: CT of the abdomen and pelvis was performed without the administration of intravenous contrast. Multiplanar reformatted images are provided for review. Automated exposure control, iterative reconstruction, and/or weight-based adjustment of the mA/kV was utilized to reduce the radiation dose to as low as reasonably achievable. COMPARISON: 04/11/2024 CLINICAL HISTORY: Sepsis, UTI, acute renal failure. FINDINGS: LOWER CHEST: Small right and trace left pleural effusions, new from prior examination. LIVER: The liver is unremarkable. GALLBLADDER AND BILE DUCTS: Gallbladder is unremarkable. No biliary ductal dilatation.  SPLEEN: No acute abnormality. PANCREAS: No acute abnormality. ADRENAL GLANDS: No acute abnormality. KIDNEYS, URETERS AND BLADDER: The kidneys are normal in size and position. Punctate 1-2 mm nonobstructing calculi are seen within the kidneys bilaterally. Mild left hydronephrosis to the level of the left ureterovesicular junction, new from prior examination. This may be impinged by the enlarged central lobe of the prostate gland. Moderate left perinephric stranding. Changes within the left kidney may relate to acute pyelonephritis or represent residual obstructive changes secondary to a recently passed ureteral calculus. No hydronephrosis on the right. No ureteral calculi on the right. No perinephric or periureteral stranding on the right. Numerous 2-3 mm calculi are seen dependently within the bladder lumen, similar to prior examination. Foley catheter balloon is seen within an unfortunately decompressed bladder lumen. The bladder wall is circumferentially thickened. GI AND BOWEL: Appendix normal. The stomach, small bowel, and large bowel are otherwise unremarkable. There is no bowel obstruction. PERITONEUM AND RETROPERITONEUM: No ascites. No free air. VASCULATURE: Mild aortoiliac atherosclerotic calcification. No aortic aneurysm. Aorta is normal in caliber. LYMPH NODES: No lymphadenopathy. REPRODUCTIVE ORGANS: Enlarged central lobe of the prostate gland, which may be impinging the left ureterovesicular junction. BONES AND SOFT TISSUES: Osseous structures are age appropriate. No acute bone abnormality. No lytic or blastic bone lesion. No focal soft tissue abnormality. IMPRESSION: 1. Small right and trace left pleural effusions, new from prior examination 2. Mild left hydronephrosis to the level of the left ureterovesicular junction, possibly impinged by enlarged central prostatic lobe, new from prior examination; moderate left perinephric stranding, correlate for urinary tract infection versus residual changes from  a recently passed ureteral calculus 3. Numerous 23 mm dependent bladder calculi, similar to prior; punctate 12 mm nonobstructing bilateral renal calculi; no right hydronephrosis and no right ureteral calculus 4. raf score: Aortic atherosclerosis (icd10-i70.0) Electronically signed by: Dorethia Molt MD 06/11/2024 10:52 PM EST RP Workstation: [id number]   ECHOCARDIOGRAM COMPLETE Result Date: 05/31/2024    ECHOCARDIOGRAM REPORT   Patient Name:   Antoney Biven Date of Exam: 05/31/2024 Medical Rec #:  [id number]    Height:       70.0 in Accession #:    [id number]   Weight:       147.9 lb Date of Birth:  April 29, 1941    BSA:          1.836 m Patient Age:    83 years     BP:  146/124 mmHg Patient Gender: M            HR:           85 bpm. Exam Location:  Inpatient Procedure: 2D Echo and Strain Analysis (Both Spectral and Color Flow Doppler            were utilized during procedure). Indications:    Post TAVR evaluation  History:        Patient has prior history of Echocardiogram examinations. Risk                 Factors:Hypertension.                 Aortic Valve: 29 mm Sapien prosthetic, stented (TAVR) valve is                 present in the aortic position.  Sonographer:    Charmaine Gaskins Referring Phys: KATHRYN R THOMPSON IMPRESSIONS  1. Left ventricular ejection fraction, by estimation, is 55 to 60%. The left ventricle has normal function. The left ventricle has no regional wall motion abnormalities. There is moderate concentric left ventricular hypertrophy. Left ventricular diastolic parameters are consistent with Grade I diastolic dysfunction (impaired relaxation). The average left ventricular global longitudinal strain is -15.1 %. The global longitudinal strain is abnormal.  2. Right ventricular systolic function is normal. The right ventricular size is normal.  3. Left atrial size was severely dilated.  4. Posterior leaflet severely restricted. The mitral valve is degenerative. Mild mitral valve  regurgitation. No evidence of mitral stenosis. The mean mitral valve gradient is 4.0 mmHg with average heart rate of 81 bpm. Severe mitral annular calcification.  5. Mild perivalvular regurgitation. The aortic valve has been repaired/replaced. Aortic valve regurgitation is mild. No aortic stenosis is present. There is a 29 mm Sapien prosthetic (TAVR) valve present in the aortic position. Aortic valve area, by VTI  measures 2.12 cm. Aortic valve mean gradient measures 10.0 mmHg. Aortic valve Vmax measures 2.44 m/s.  6. Aortic dilatation noted. There is mild dilatation of the ascending aorta, measuring 43 mm.  7. The inferior vena cava is normal in size with greater than 50% respiratory variability, suggesting right atrial pressure of 3 mmHg. FINDINGS  Left Ventricle: Left ventricular ejection fraction, by estimation, is 55 to 60%. The left ventricle has normal function. The left ventricle has no regional wall motion abnormalities. The average left ventricular global longitudinal strain is -15.1 %. Strain was performed and the global longitudinal strain is abnormal. The left ventricular internal cavity size was normal in size. There is moderate concentric left ventricular hypertrophy. Left ventricular diastolic parameters are consistent with Grade I diastolic dysfunction (impaired relaxation). Indeterminate filling pressures. Right Ventricle: The right ventricular size is normal. No increase in right ventricular wall thickness. Right ventricular systolic function is normal. Left Atrium: Left atrial size was severely dilated. Right Atrium: Right atrial size was normal in size. Pericardium: There is no evidence of pericardial effusion. Mitral Valve: Posterior leaflet severely restricted. The mitral valve is degenerative in appearance. There is moderate thickening of the mitral valve leaflet(s). There is moderate calcification of the mitral valve leaflet(s). Severe mitral annular calcification. Mild mitral valve  regurgitation. No evidence of mitral valve stenosis. MV peak gradient, 12.0 mmHg. The mean mitral valve gradient is 4.0 mmHg with average heart rate of 81 bpm. Tricuspid Valve: The tricuspid valve is normal in structure. Tricuspid valve regurgitation is not demonstrated. No evidence of tricuspid stenosis. Aortic Valve: Mild perivalvular  regurgitation. The aortic valve has been repaired/replaced. Aortic valve regurgitation is mild. No aortic stenosis is present. Aortic valve mean gradient measures 10.0 mmHg. Aortic valve peak gradient measures 23.8 mmHg. Aortic valve area, by VTI measures 2.12 cm. There is a 29 mm Sapien prosthetic, stented (TAVR) valve present in the aortic position. Pulmonic Valve: The pulmonic valve was normal in structure. Pulmonic valve regurgitation is trivial. No evidence of pulmonic stenosis. Aorta: Aortic dilatation noted. There is mild dilatation of the ascending aorta, measuring 43 mm. Venous: The inferior vena cava is normal in size with greater than 50% respiratory variability, suggesting right atrial pressure of 3 mmHg. IAS/Shunts: No atrial level shunt detected by color flow Doppler.  LEFT VENTRICLE PLAX 2D LVIDd:         4.30 cm   Diastology LVIDs:         2.50 cm   LV e' medial:    5.87 cm/s LV PW:         1.30 cm   LV E/e' medial:  13.1 LV IVS:        1.40 cm   LV e' lateral:   9.90 cm/s LVOT diam:     2.20 cm   LV E/e' lateral: 7.8 LV SV:         81 LV SV Index:   44        2D Longitudinal Strain LVOT Area:     3.80 cm  2D Strain GLS (A4C):   -16.2 %                          2D Strain GLS (A3C):   -15.2 %                          2D Strain GLS (A2C):   -13.9 %                          2D Strain GLS Avg:     -15.1 % RIGHT VENTRICLE RV Basal diam:  2.10 cm RV Mid diam:    2.20 cm RV S prime:     12.60 cm/s LEFT ATRIUM             Index        RIGHT ATRIUM           Index LA diam:        4.10 cm 2.23 cm/m   RA Area:     14.30 cm LA Vol (A2C):   83.6 ml 45.53 ml/m  RA Volume:    32.30 ml  17.59 ml/m LA Vol (A4C):   92.2 ml 50.21 ml/m LA Biplane Vol: 91.4 ml 49.78 ml/m  AORTIC VALVE AV Area (Vmax):    1.95 cm AV Area (Vmean):   2.14 cm AV Area (VTI):     2.12 cm AV Vmax:           244.00 cm/s AV Vmean:          141.000 cm/s AV VTI:            0.380 m AV Peak Grad:      23.8 mmHg AV Mean Grad:      10.0 mmHg LVOT Vmax:         125.00 cm/s LVOT Vmean:        79.500 cm/s LVOT VTI:  0.212 m LVOT/AV VTI ratio: 0.56  AORTA Ao Root diam: 3.30 cm Ao Asc diam:  4.30 cm MITRAL VALVE MV Area (PHT): 5.70 cm     SHUNTS MV Area VTI:   2.66 cm     Systemic VTI:  0.21 m MV Peak grad:  12.0 mmHg    Systemic Diam: 2.20 cm MV Mean grad:  4.0 mmHg MV Vmax:       1.73 m/s MV Vmean:      96.0 cm/s MV Decel Time: 133 msec MV E velocity: 76.80 cm/s MV A velocity: 158.00 cm/s MV E/A ratio:  0.49 Annabella Scarce MD Electronically signed by Annabella Scarce MD Signature Date/Time: 05/31/2024/11:38:51 AM    Final    ECHOCARDIOGRAM LIMITED Result Date: 05/30/2024    ECHOCARDIOGRAM LIMITED REPORT   Patient Name:   MARQUELLE MUSGRAVE Date of Exam: 05/30/2024 Medical Rec #:  [id number]    Height:       70.0 in Accession #:    [id number]   Weight:       141.0 lb Date of Birth:  1940-11-19    BSA:          1.799 m Patient Age:    83 years     BP:           166/60 mmHg Patient Gender: M            HR:           84 bpm. Exam Location:  Inpatient Procedure: Limited Echo, Cardiac Doppler and Color Doppler (Both Spectral and            Color Flow Doppler were utilized during procedure). Indications:     Aortic Stenosis i35.0  History:         Patient has prior history of Echocardiogram examinations, most                  recent 04/11/2024. Risk Factors:Hypertension and Dyslipidemia.  Sonographer:     Damien Senior RDCS Referring Phys:  [id number] LURENA MARLA RED Diagnosing Phys: Stanly Leavens MD  Sonographer Comments: 29mm Edwards S3U TAVR implanted IMPRESSIONS  1. Left ventricular ejection fraction, by estimation,  is 60 to 65%. The left ventricle has normal function.  2. Right ventricular systolic function is normal. The right ventricular size is normal.  3. Mild to moderate mitral valve regurgitation. Severe mitral annular calcification.  4. Prior to procedure, severe aortic stenosis. Mean gradient 34 mm Hg Peak gradient 57 mm Hg. Moderate central AI. DVI 0.26, AVA 0.98 cm2.     After procedure, a 29 mm Sapien Valve. Mean gradient 4 mm Hg, Peak gradient 7 mm Hg, DVI 0.62. EOA 2.38 cm2. Trivial PVL against the intervalvular fibrosa. FINDINGS  Left Ventricle: Left ventricular ejection fraction, by estimation, is 60 to 65%. The left ventricle has normal function. The left ventricular internal cavity size was normal in size. Right Ventricle: The right ventricular size is normal. Right ventricular systolic function is normal. Mitral Valve: Severe mitral annular calcification. Mild to moderate mitral valve regurgitation. MV peak gradient, 8.0 mmHg. The mean mitral valve gradient is 3.0 mmHg. Aortic Valve: Prior to procedure, severe aortic stenosis. Mean gradient 34 mm Hg Peak gradient 57 mm Hg. Moderate central AI. DVI 0.26, AVA 0.98 cm2. After procedure, a 29 mm Sapien Valve. Mean gradient 4 mm Hg, Peak gradient 7 mm Hg, DVI 0.62. EOA 2.38 cm2. Trivial PVL against the intervalvular fibrosa. Aortic regurgitation PHT measures 251 msec. Aortic valve mean  gradient measures 4.0 mmHg. Aortic valve peak gradient measures 6.7 mmHg. Aortic valve area, by VTI measures 2.38 cm. Additional Comments: Spectral Doppler performed. Color Doppler performed.  LEFT VENTRICLE PLAX 2D LVOT diam:     2.20 cm LV SV:         63 LV SV Index:   35 LVOT Area:     3.80 cm  AORTIC VALVE AV Area (Vmax):    2.37 cm AV Area (Vmean):   2.62 cm AV Area (VTI):     2.38 cm AV Vmax:           129.00 cm/s AV Vmean:          89.900 cm/s AV VTI:            0.265 m AV Peak Grad:      6.7 mmHg AV Mean Grad:      4.0 mmHg LVOT Vmax:         80.30 cm/s LVOT Vmean:         62.000 cm/s LVOT VTI:          0.166 m LVOT/AV VTI ratio: 0.63 AI PHT:            251 msec MITRAL VALVE MV Area VTI:  2.58 cm   SHUNTS MV Peak grad: 8.0 mmHg   Systemic VTI:  0.17 m MV Mean grad: 3.0 mmHg   Systemic Diam: 2.20 cm MV Vmax:      1.41 m/s MV Vmean:     84.3 cm/s Stanly Leavens MD Electronically signed by Stanly Leavens MD Signature Date/Time: 05/30/2024/8:48:47 PM    Final    Structural Heart Procedure Result Date: 05/30/2024 See surgical note for result.  Microbiology: Results for orders placed or performed during the hospital encounter of 06/17/24  Blood culture (routine x 2)     Status: None   Collection Time: 06/17/24  4:53 AM   Specimen: BLOOD RIGHT HAND  Result Value Ref Range Status   Specimen Description BLOOD RIGHT HAND  Final   Special Requests   Final    BOTTLES DRAWN AEROBIC ONLY Blood Culture results may not be optimal due to an inadequate volume of blood received in culture bottles   Culture   Final    NO GROWTH 5 DAYS Performed at [facility], [address] [place], [place] [address]    Report Status 06/22/2024 FINAL  Final  Blood culture (routine x 2)     Status: None   Collection Time: 06/17/24  6:15 AM   Specimen: BLOOD LEFT ARM  Result Value Ref Range Status   Specimen Description BLOOD LEFT ARM  Final   Special Requests   Final    BOTTLES DRAWN AEROBIC AND ANAEROBIC Blood Culture results may not be optimal due to an inadequate volume of blood received in culture bottles   Culture   Final    NO GROWTH 5 DAYS Performed at [facility], [address] [place], [place] [address]    Report Status 06/22/2024 FINAL  Final  Urine Culture     Status: None   Collection Time: 06/17/24  8:55 AM   Specimen: Urine, Clean Catch  Result Value Ref Range Status   Specimen Description URINE, CLEAN CATCH  Final   Special Requests NONE  Final   Culture   Final    NO GROWTH Performed at [facility], [address]  [place], [place] [address]    Report Status 06/19/2024 FINAL  Final   Labs: CBC: Recent Labs  Lab 06/21/24 0307 06/22/24 0259 06/23/24 0439 06/25/24 0858 06/25/24 2332 06/26/24 0601  WBC 13.1* 9.4 9.4 6.7  --  6.8  NEUTROABS  --   --   --  4.5  --  4.6  HGB 7.2* 7.0* 7.7* 6.5* 8.1* 8.7*  HCT 22.1* 21.4* 23.8* 20.1* 24.7* 26.2*  MCV 92.9 93.0 94.1 95.3  --  92.3  PLT 117* 119* 137* 133*  --  139*   Basic Metabolic Panel: Recent Labs  Lab 06/21/24 0307 06/22/24 0259 06/23/24 1849 06/24/24 0712 06/25/24 0207 06/25/24 1044 06/26/24 0601  NA 140 139 138  --  140  --  141  K 3.8 3.4* 4.2  --  3.9  --  3.6  CL 113* 110 111  --  111  --  111  CO2 18* 20* 20*  --  19*  --  23  GLUCOSE 90 99 117*  --  95  --  101*  BUN 32* 29* 34*  --  42*  --  38*  CREATININE 1.93* 1.80* 2.29*  --  2.98*  --  2.16*  CALCIUM  8.2* 7.9* 8.2*  --  8.3*  --  8.2*  MG  --   --  1.6* 1.6* 2.2  --  1.8  PHOS  --   --   --   --   --  3.6 4.0   Liver Function Tests: Recent Labs  Lab 06/25/24 1044 06/26/24 0601  AST 15 15  ALT 10 10  ALKPHOS 47 44  BILITOT 1.0 1.0  PROT 4.4* 4.3*  ALBUMIN  2.3* 2.2*   CBG: No results for input(s): GLUCAP in the last 168 hours.  Discharge time spent: greater than 30 minutes.  Signed: Alejandro Marker, DO [facility] 06/26/2024

## 2024-06-26 NOTE — Progress Notes (Addendum)
 This chaplain responded to Dr. Briana consult for creating the Pt. Advance Directive.   The chaplain reviewed the Pt. chart before the visit and understands the Pt. daughters Miriam Framzen and [name] Flack are the Pt. HCPOAs.  At the time of the visit, the Pt. confirmed his choice of HCPOA with the chaplain.  This chaplain is available for F/U spiritual care as needed.  Chaplain Leeroy Hummer [phone]

## 2024-06-26 NOTE — Progress Notes (Signed)
 Physical Therapy Treatment Patient Details Name: Drew Adams MRN: [id number] DOB: 1941/07/27 Today's Date: 06/26/2024   History of Present Illness 83 y.o. male admitted 06/17/24 after two falls at home. First fall was controlled by family and lowered to the floor without injury.  Had subsequent fall early this morning with head injury. Noted to be orthostatic with EMS. Pt sustained an S-shaped 4cm laceration to top of scalp, no active bleeding, secondary wounds to left parietal scalp. Foley in place, dried blood present around meatus and along foley tubing, some blood collected in foley bag. Rapid Response called 11/23 d/t hypotension (78/38), fluid bolus ordered. PMHx: aortic stenosis s/p TAVR (05/30/2024), CAD, CKD 3B, dementia, urinary retention, BPH, and prior TIA. Of note, recent admission 11/16-11/20 for AKI and urinary retention.    PT Comments  Pt progressing well towards all goals. Pt without orthostatic BP this date and was able to amb 300' with RW and close contact guard assist. Pt continues to demo impaired balance requiring use of RW, generalized weakness, and requires assist for ADLs. Pt to cont to benefit from inpatient rehab program < 3 hrs a day to allow for increased time to achieve safe mod I level of function for transition home alone.    If plan is discharge home, recommend the following: Assistance with cooking/housework;Assist for transportation;Help with stairs or ramp for entrance;A little help with walking and/or transfers;A little help with bathing/dressing/bathroom   Can travel by private vehicle        Equipment Recommendations   (son in law states they have a RW and BSC)    Recommendations for Other Services       Precautions / Restrictions Precautions Precautions: Fall Restrictions Weight Bearing Restrictions Per Provider Order: No     Mobility  Bed Mobility Overal bed mobility: Needs Assistance Bed Mobility: Supine to Sit     Supine to sit:  Supervision     General bed mobility comments: HOB elevated to 46 deg, verbal cues to complete task but no physical assist    Transfers Overall transfer level: Needs assistance Equipment used: Rolling walker (2 wheels) Transfers: Sit to/from Stand, Bed to chair/wheelchair/BSC Sit to Stand: Min assist           General transfer comment: verbal cues for hand placement, minA to power up and stabilize during transition of hands    Ambulation/Gait Ambulation/Gait assistance: Contact guard assist Gait Distance (Feet): 300 Feet Assistive device: Rolling walker (2 wheels) Gait Pattern/deviations: Step-to pattern, Step-through pattern Gait velocity: dec compared to baseline     General Gait Details: decreased step length however fluid and reciprocal, forward head, good walker management, denies dizziness   Stairs             Wheelchair Mobility     Tilt Bed    Modified Rankin (Stroke Patients Only)       Balance Overall balance assessment: Needs assistance Sitting-balance support: Bilateral upper extremity supported, Feet supported Sitting balance-Leahy Scale: Fair Sitting balance - Comments: Pt sat EOB with close supervision. Postural control: Posterior lean Standing balance support: Bilateral upper extremity supported, During functional activity, Reliant on assistive device for balance Standing balance-Leahy Scale: Fair Standing balance comment: at this time requires RW/UE support                            Communication Communication Communication: No apparent difficulties  Cognition Arousal: Alert Behavior During Therapy: [facility] for tasks assessed/performed  PT - Cognitive impairments: History of cognitive impairments (dementia)                       PT - Cognition Comments: pt engaging, able to follow commands consistently Following commands: Intact      Cueing Cueing Techniques: Verbal cues, Gestural cues, Tactile cues   Exercises General Exercises - Lower Extremity Gluteal Sets: AROM, Both, 10 reps, Seated Long Arc Quad: AROM, Both, 10 reps, Seated (with 3 sec hold at end range) Hip Flexion/Marching: AROM, Both, 10 reps, Standing    General Comments General comments (skin integrity, edema, etc.): BP hob at 46deg, 106/57, sitting 120/55, s/p 1 min standing 121/62      Pertinent Vitals/Pain Pain Assessment Pain Assessment: No/denies pain    Home Living                          Prior Function            PT Goals (current goals can now be found in the care plan section) Acute Rehab PT Goals Patient Stated Goal: To go to rehab and get stronger before home, I was running and independent just a couple weeks ago. PT Goal Formulation: With patient/family Time For Goal Achievement: 07/02/24 Potential to Achieve Goals: Good Progress towards PT goals: Progressing toward goals    Frequency    Min 2X/week      PT Plan      Co-evaluation              AM-PAC PT 6 Clicks Mobility   Outcome Measure  Help needed turning from your back to your side while in a flat bed without using bedrails?: A Little Help needed moving from lying on your back to sitting on the side of a flat bed without using bedrails?: A Little Help needed moving to and from a bed to a chair (including a wheelchair)?: A Little Help needed standing up from a chair using your arms (e.g., wheelchair or bedside chair)?: A Little Help needed to walk in hospital room?: A Little Help needed climbing 3-5 steps with a railing? : A Lot 6 Click Score: 17    End of Session Equipment Utilized During Treatment: Gait belt Activity Tolerance: Patient tolerated treatment well Patient left: with call bell/phone within reach;in chair;with family/visitor present Nurse Communication: Mobility status PT Visit Diagnosis: Unsteadiness on feet (R26.81);Other abnormalities of gait and mobility (R26.89);Muscle weakness (generalized)  (M62.81)     Time: 9093-9065 PT Time Calculation (min) (ACUTE ONLY): 28 min  Charges:    $Gait Training: 8-22 mins $Therapeutic Exercise: 8-22 mins PT General Charges $$ ACUTE PT VISIT: 1 Visit                     Norene Ames, PT, DPT Acute Rehabilitation Services Secure chat preferred Office #: [phone]    Norene CHRISTELLA Ames 06/26/2024, 9:44 AM

## 2024-06-27 ENCOUNTER — Emergency Department (HOSPITAL_COMMUNITY)
Admission: EM | Admit: 2024-06-27 | Discharge: 2024-06-27 | Disposition: A | Discharge status: Skilled Nursing Facility | Attending: Emergency Medicine | Admitting: Emergency Medicine

## 2024-06-27 DIAGNOSIS — N189 Chronic kidney disease, unspecified: Secondary | ICD-10-CM | POA: Insufficient documentation

## 2024-06-27 DIAGNOSIS — L7622 Postprocedural hemorrhage and hematoma of skin and subcutaneous tissue following other procedure: Secondary | ICD-10-CM | POA: Diagnosis not present

## 2024-06-27 DIAGNOSIS — E872 Acidosis, unspecified: Secondary | ICD-10-CM | POA: Diagnosis not present

## 2024-06-27 DIAGNOSIS — X58XXXD Exposure to other specified factors, subsequent encounter: Secondary | ICD-10-CM | POA: Diagnosis not present

## 2024-06-27 DIAGNOSIS — D62 Acute posthemorrhagic anemia: Secondary | ICD-10-CM | POA: Diagnosis not present

## 2024-06-27 DIAGNOSIS — Z7982 Long term (current) use of aspirin: Secondary | ICD-10-CM | POA: Diagnosis not present

## 2024-06-27 DIAGNOSIS — I2510 Atherosclerotic heart disease of native coronary artery without angina pectoris: Secondary | ICD-10-CM | POA: Insufficient documentation

## 2024-06-27 DIAGNOSIS — L89312 Pressure ulcer of right buttock, stage 2: Secondary | ICD-10-CM | POA: Diagnosis not present

## 2024-06-27 DIAGNOSIS — T148XXA Other injury of unspecified body region, initial encounter: Secondary | ICD-10-CM

## 2024-06-27 DIAGNOSIS — S0101XA Laceration without foreign body of scalp, initial encounter: Secondary | ICD-10-CM | POA: Diagnosis not present

## 2024-06-27 DIAGNOSIS — W19XXXA Unspecified fall, initial encounter: Secondary | ICD-10-CM | POA: Diagnosis not present

## 2024-06-27 DIAGNOSIS — E8729 Other acidosis: Secondary | ICD-10-CM | POA: Diagnosis not present

## 2024-06-27 DIAGNOSIS — R58 Hemorrhage, not elsewhere classified: Secondary | ICD-10-CM | POA: Diagnosis not present

## 2024-06-27 DIAGNOSIS — S0101XD Laceration without foreign body of scalp, subsequent encounter: Secondary | ICD-10-CM | POA: Insufficient documentation

## 2024-06-27 MED ORDER — TAMSULOSIN HCL 0.4 MG PO CAPS: 0.8000 mg | ORAL_CAPSULE | Freq: Every day | ORAL | Status: DC

## 2024-06-27 MED ORDER — VITAMIN B-12 1000 MCG PO TABS: 1000.0000 ug | ORAL_TABLET | Freq: Every day | ORAL | Status: DC

## 2024-06-27 MED ORDER — LIDOCAINE-EPINEPHRINE (PF) 2 %-1:200000 IJ SOLN
10.0000 mL | Freq: Once | INTRAMUSCULAR | Status: AC
  Administered 2024-06-27: 10 mL
  Filled 2024-06-27: qty 20

## 2024-06-27 MED ORDER — ASPIRIN 81 MG PO CHEW: 81.0000 mg | CHEWABLE_TABLET | Freq: Every day | ORAL | Status: DC

## 2024-06-27 MED ORDER — FINASTERIDE 5 MG PO TABS: 5.0000 mg | ORAL_TABLET | Freq: Every day | ORAL | Status: DC

## 2024-06-27 MED ORDER — ONDANSETRON HCL 4 MG PO TABS: 4.0000 mg | ORAL_TABLET | Freq: Four times a day (QID) | ORAL | Status: DC | PRN

## 2024-06-27 MED ORDER — VITAMIN C 500 MG PO TABS: 1000.0000 mg | ORAL_TABLET | Freq: Every day | ORAL | Status: DC

## 2024-06-27 MED ORDER — HYDROCODONE-ACETAMINOPHEN 5-325 MG PO TABS: 1.0000 | ORAL_TABLET | Freq: Four times a day (QID) | ORAL | Status: DC | PRN

## 2024-06-27 MED ORDER — ALLOPURINOL 100 MG PO TABS: 300.0000 mg | ORAL_TABLET | Freq: Every day | ORAL | Status: DC

## 2024-06-27 MED ORDER — ALBUTEROL SULFATE (2.5 MG/3ML) 0.083% IN NEBU: 2.5000 mg | INHALATION_SOLUTION | Freq: Four times a day (QID) | RESPIRATORY_TRACT | Status: DC | PRN

## 2024-06-27 NOTE — Discharge Instructions (Addendum)
 Elspeth Schneider  Thank you for allowing us  to take care of you today.  You came to the Emergency Department today because you had bleeding from your wound on your head that recently got stitched.  Here in the emergency department we got the bleeding better, and put in a stitch in the area that was bleeding.  We watched you in the emergency department and you did not have repeat bleeding.  We recommend keeping the dressing on for 24 hours.  You should get the stitches removed in 7 to 10 days.  To-Do: 1. Please follow-up with your primary doctor within 1 - 2 weeks / as soon as possible.   Please return to the Emergency Department or call 911 if you experience have worsening of your symptoms, or do not get better, recurrent bleeding, chest pain, shortness of breath, severe or significantly worsening pain, high fever, severe confusion, pass out or have any reason to think that you need emergency medical care.   We hope you feel better soon.   Mitzie Later, MD Department of Emergency Medicine [facility] [place]

## 2024-06-27 NOTE — ED Triage Notes (Signed)
 Pt BIBA from [facility] for suture check.  Pt fell on 11/22, requiring stitches. Today one of the stitches came out, pulsatile bleeding noted, soaking guaze placed in route.     Denies thinners or repeat falls.

## 2024-06-27 NOTE — ED Provider Notes (Signed)
 [place] EMERGENCY DEPARTMENT AT [facility] Provider Note   CSN: [id number] Arrival date & time: 06/27/24  1323     History Chief Complaint  Patient presents with   Wound Check    HPI: Drew Adams is a 83 y.o. male with history pertinent for aortic stenosis s/p TAVR, hyperlipidemia, CAD, CKD, BPH, urinary retention  who presents complaining of bleeding from head wound. Patient arrived via EMS from facility. History provided by patient.  No interpreter required during this encounter.  Patient denies use of anticoagulants. Reports that today one of the sutures in his wound from 11/25 came out, unclear how this occurred.  Reports that thereafter he developed significant bleeding from his head wound.  Did not have any additional trauma or falls, denies lightheadedness, chest pain, shortness of breath.  Patient's recorded medical, surgical, social, medication list and allergies were reviewed in the Snapshot window as part of the initial history.   Prior to Admission medications   Medication Sig Start Date End Date Taking? Authorizing Provider  acetaminophen  (TYLENOL ) 325 MG tablet Take 2 tablets (650 mg total) by mouth every 6 (six) hours as needed for mild pain (pain score 1-3) or fever (or Fever >/= 101). 06/26/24   Sherrill Cable Latif, DO  albuterol  (PROVENTIL ) (2.5 MG/3ML) 0.083% nebulizer solution Take 3 mLs (2.5 mg total) by nebulization every 6 (six) hours as needed for wheezing or shortness of breath. 06/26/24   Sherrill Cable Latif, DO  allopurinol  (ZYLOPRIM ) 300 MG tablet Take 300 mg by mouth daily. 11/15/12   [provider]  Ascorbic Acid  (VITAMIN C ) 1000 MG tablet Take 1,000 mg by mouth daily.    [provider]  aspirin  81 MG chewable tablet Chew 1 tablet (81 mg total) by mouth daily. 06/01/24   Sebastian Lamarr SAUNDERS, PA-C  calcium  citrate (CALCITRATE - DOSED IN MG ELEMENTAL CALCIUM ) 950 MG tablet Take 600 mg by mouth daily.    [provider]   cyanocobalamin  (VITAMIN B12) 1000 MCG tablet Take 1,000 mcg by mouth daily.    [provider]  finasteride  (PROSCAR ) 5 MG tablet Take 5 mg by mouth daily.    [provider]  HYDROcodone -acetaminophen  (NORCO/VICODIN) 5-325 MG tablet Take 1 tablet by mouth every 6 (six) hours as needed for up to 3 days for severe pain (pain score 7-10). 06/26/24 06/29/24  Sheikh, Omair Latif, DO  hydrocortisone cream 1 % Apply 1 Application topically daily.    [provider]  MAGNESIUM  GLYCINATE PO Take 400 mg by mouth daily.    [provider]  meclizine  (ANTIVERT ) 25 MG tablet Take 1 tablet (25 mg total) by mouth 3 (three) times daily as needed for dizziness. 02/23/24   Mannie Fairy DASEN, DO  Multiple Vitamin (MULTIVITAMIN WITH MINERALS) TABS Take 1 tablet by mouth daily. Mature    [provider]  ondansetron  (ZOFRAN ) 4 MG tablet Take 1 tablet (4 mg total) by mouth every 6 (six) hours as needed for nausea. 06/26/24   Sherrill Cable Latif, DO  oxymetazoline  (AFRIN) 0.05 % nasal spray Place 1 spray into both nostrils 2 (two) times daily. 06/15/24   Singh, Prashant K, MD  pantoprazole  (PROTONIX ) 40 MG tablet Take 1 tablet (40 mg total) by mouth daily. 06/15/24   Singh, Prashant K, MD  predniSONE  (DELTASONE ) 5 MG tablet Take 5 mg by mouth daily as needed (Gout). 01/07/18   [provider]  psyllium (METAMUCIL) 58.6 % packet Take 1 packet by mouth daily. 10  ml    [provider]  PURE L-ARGININE HCL PO Take 1,800 mg by mouth daily.    [provider]  sodium chloride  (OCEAN) 0.65 % SOLN nasal spray Place 1 spray into both nostrils 4 (four) times daily. 06/15/24   Dennise Lavada POUR, MD  tamsulosin  (FLOMAX ) 0.4 MG CAPS capsule Take 2 capsules (0.8 mg total) by mouth daily. 06/26/24   Sheikh, Omair Latif, DO  Zinc  Gluconate 15 MG TABS Take 50 mg by mouth daily. Plus copper 2 mg    [provider]     Allergies: Crestor  [rosuvastatin ], Exforge  [amlodipine besylate-valsartan], and Simvastatin   Review of Systems   ROS as per HPI  Physical Exam Updated Vital Signs BP 122/69 (BP Location: Left Arm)   Pulse 92   Temp (!) 97.4 F (36.3 C) (Oral)   Resp 16   SpO2 96%  Physical Exam Vitals and nursing note reviewed.  Constitutional:      General: He is not in acute distress.    Appearance: Normal appearance.  HENT:     Head: Normocephalic.     Comments: Laceration to crown of scalp with pulsatile bleeding from middle of wound  Eyes:     Extraocular Movements: Extraocular movements intact.  Cardiovascular:     Rate and Rhythm: Normal rate.     Pulses: Normal pulses.  Pulmonary:     Effort: Pulmonary effort is normal.  Skin:    General: Skin is warm and dry.     Capillary Refill: Capillary refill takes less than 2 seconds.  Neurological:     General: No focal deficit present.     Mental Status: He is alert and oriented to person, place, and time.     ED Course/ Medical Decision Making/ A&P    Procedures .Laceration Repair  Date/Time: 06/27/2024 4:24 PM  Performed by: Rogelia Jerilynn RAMAN, MD Authorized by: Rogelia Jerilynn RAMAN, MD   Consent:    Consent obtained:  Verbal   Consent given by:  Patient   Risks discussed:  Infection, need for additional repair and vascular damage Universal protocol:    Immediately prior to procedure, a time out was called: yes     Patient identity confirmed:  Verbally with patient Anesthesia:    Anesthesia method:  Local infiltration   Local anesthetic:  Lidocaine  2% WITH epi Laceration details:    Location:  Scalp   Scalp location:  Crown   Length (cm):  1   Depth (mm):  1 Pre-procedure details:    Preparation:  Patient was prepped and draped in usual sterile fashion Exploration:    Hemostasis achieved with:  Direct pressure and epinephrine  Treatment:    Irrigation solution:  Sterile water    Irrigation method:  Syringe Skin repair:    Repair method:  Sutures   Suture  size:  3-0   Wound skin closure material used: Ethilon.   Suture technique:  Simple interrupted and horizontal mattress   Number of sutures: 1H horizontal mattress and simple interrupted. Approximation:    Approximation:  Close Repair type:    Repair type:  Simple Post-procedure details:    Dressing: Dressed with combat gauze and Tegaderm.   Procedure completion:  Tolerated well, no immediate complications    Medications Ordered in ED Medications  albuterol  (PROVENTIL ) (2.5 MG/3ML) 0.083% nebulizer solution 2.5 mg (has no administration in time range)  allopurinol  (ZYLOPRIM ) tablet 300 mg (has no administration in time range)  ascorbic acid  (VITAMIN C ) tablet 1,000 mg (  has no administration in time range)  aspirin  chewable tablet 81 mg (has no administration in time range)  cyanocobalamin  (VITAMIN B12) tablet 1,000 mcg (has no administration in time range)  finasteride  (PROSCAR ) tablet 5 mg (has no administration in time range)  HYDROcodone -acetaminophen  (NORCO/VICODIN) 5-325 MG per tablet 1 tablet (has no administration in time range)  ondansetron  (ZOFRAN ) tablet 4 mg (has no administration in time range)  tamsulosin  (FLOMAX ) capsule 0.8 mg (has no administration in time range)  lidocaine -EPINEPHrine  (XYLOCAINE  W/EPI) 2 %-1:200000 (PF) injection 10 mL (10 mLs Infiltration Given 06/27/24 1400)    Medical Decision Making:   Abed Schar is a 83 y.o. male who presents for bleeding wound as per above.  Physical exam is pertinent for pulsatile bleeding from healing laceration of scalp.   The differential includes but is not limited to vascular injury, dehiscence of wound, laceration  Independent historian: None  External data reviewed: Notes: Did review patient's recent discharge summary, as well as photos from 11/22 demonstrating the initial laceration status postinitial repair, appears that in the interim the furthest rightward suture has become displaced, and there is pulsatile  bleeding just to the right of the middle suture  Labs: Not indicated  Radiology: Not indicated   EKG/Medicine tests: Not indicated EKG Interpretation:    Interventions: Lidocaine /epinephrine , home medications  See the EMR for full details regarding lab and imaging results.  Patient presents to the emergency department for pulsatile bleeding from healing head wound.  Patient does have pulsatile bleeding on exam.  Direct pressure and combat gauze was utilized to obtain hemostasis, as well as injection of lidocaine  with epinephrine .  Thereafter a horizontal mattress suture as well as 1 simple interrupted suture were placed at the area where patient had pulsatile bleeding to help maintain hemostasis.  Patient was observed in the emergency department and did not have recurrent bleeding.  Patient otherwise asymptomatic did not have any trauma or falls, no symptoms of symptomatic anemia, bleeding was rapidly controlled, therefore do not feel the patient requires additional labs or imaging at this time.  Feel that patient is stable for discharge and outpatient follow-up, patient will be pending discharge in the emergency department awaiting a ride back to facility, therefore diet and pertinent home medications ordered.  Presentation is most consistent with acute complicated illness  Discussion of management or test interpretations with external provider(s): Not indicated  Risk Drugs:Prescription drug management  Disposition: DISCHARGE: I believe that the patient is safe for discharge home with outpatient follow-up. Patient was informed of all pertinent physical exam, laboratory, and imaging findings. Patient's suspected etiology of their symptom presentation was discussed with the patient and all questions were answered. We discussed following up with PCP. I provided thorough ED return precautions. The patient feels safe and comfortable with this plan.  MDM generated using voice dictation software  and may contain dictation errors.  Please contact me for any clarification or with any questions.  Clinical Impression:  1. Bleeding from wound      Discharge   Final Clinical Impression(s) / ED Diagnoses Final diagnoses:  Bleeding from wound    Rx / DC Orders ED Discharge Orders     None        Rogelia Jerilynn RAMAN, MD 06/27/24 [phone]

## 2024-07-04 DIAGNOSIS — L89312 Pressure ulcer of right buttock, stage 2: Secondary | ICD-10-CM | POA: Diagnosis not present

## 2024-07-04 DIAGNOSIS — E8729 Other acidosis: Secondary | ICD-10-CM | POA: Diagnosis not present

## 2024-07-04 DIAGNOSIS — E872 Acidosis, unspecified: Secondary | ICD-10-CM | POA: Diagnosis not present

## 2024-07-04 DIAGNOSIS — D62 Acute posthemorrhagic anemia: Secondary | ICD-10-CM | POA: Diagnosis not present

## 2024-07-05 DIAGNOSIS — R338 Other retention of urine: Secondary | ICD-10-CM | POA: Diagnosis not present

## 2024-07-05 DIAGNOSIS — N401 Enlarged prostate with lower urinary tract symptoms: Secondary | ICD-10-CM | POA: Diagnosis not present

## 2024-07-05 DIAGNOSIS — R310 Gross hematuria: Secondary | ICD-10-CM | POA: Diagnosis not present

## 2024-07-06 ENCOUNTER — Ambulatory Visit

## 2024-07-06 ENCOUNTER — Ambulatory Visit: Attending: Physician Assistant | Admitting: Physician Assistant

## 2024-07-06 DIAGNOSIS — I959 Hypotension, unspecified: Secondary | ICD-10-CM | POA: Diagnosis not present

## 2024-07-06 DIAGNOSIS — R778 Other specified abnormalities of plasma proteins: Secondary | ICD-10-CM | POA: Diagnosis not present

## 2024-07-06 DIAGNOSIS — N401 Enlarged prostate with lower urinary tract symptoms: Secondary | ICD-10-CM | POA: Diagnosis not present

## 2024-07-06 DIAGNOSIS — M6281 Muscle weakness (generalized): Secondary | ICD-10-CM | POA: Diagnosis not present

## 2024-07-06 DIAGNOSIS — R2681 Unsteadiness on feet: Secondary | ICD-10-CM | POA: Diagnosis not present

## 2024-07-06 NOTE — Progress Notes (Deleted)
 [facility]   MULTIDISCIPLINARY HEART VALVE CLINIC                                     Cardiology Office Note:    Date:  07/06/2024   ID:  Drew Adams, DOB Feb 14, 1941, MRN [id number]  PCP:  Caleen Dirks, MD  [facility] Cardiologist:  [name], MD  [facility] Structural heart: Lurena MARLA Red, MD [facility] Electrophysiologist:  None   Referring MD: Stacia Millman, PA   1 month s/p TAVR  History of Present Illness:    Drew Adams is a 83 y.o. male with a hx of dementia, falls, CKD stage IIIb, BPH, HTN, TAA (4.6cm), severe MAC with mild MS/MR, HLD and mixed aortic valve disease with severe AS and mod-severe AI s/p TAVR (05/30/24) who presents to clinic for follow up.   Echo 05/2024 with LVEF=55-60%, severe MAC with moderate MR and mild MS and severe aortic stenosis with a mean grad 26.8 mmHg, Vmax of 4.0 m/s, AVA 0.89 cm2 and mod-severe AI. Coronary angiography 05/03/24 showed moderate disease in the mid LAD and diagonal branch, mild to moderate diffuse non-obstructive disease in large dominant RCA and no obstructive disease in the Circumflex. Medical therapy was recommended. CTA 04/12/24 with TAA 4.6cm. s/p TAVR with a 29 mm Edwards Sapien 3 Ultra Resilia THV via the TF approach on 05/30/24. Post operative echo showed EF 55%, mod cLVH, mild MR, normally functioning TAVR with a mean gradient of 10.0 mmHg and mild PVL. He had AKI after TAVR felt to be 2/2 IV lasix  but later readmitted for acute renal failure 2/2 acute urinary retention with UTI. At one point he refused a foley and was seen by palliative care, but eventually allowed foley placement and was discharged to a SNF given worsening dementia and falls.    Today the patient presents to clinic for follow up.     Past Medical History:  Diagnosis Date   Aortic stenosis    s/p TAVR on 05/30/24   Aortic valve disease    BPH (benign prostatic hyperplasia)    Diverticulosis of colon without  hemorrhage 11/28/2012   ED (erectile dysfunction)    Elevated PSA    Gout    uses prednisone  1-2x/week   Hypertension    Iron deficiency anemia 11/28/2012   Mitral valve disease    Mixed hyperlipidemia    Ocular migraine    Paraesophageal hiatal hernia 11/28/2012   Radiculopathy    Thoracic aortic aneurysm (TAA)      Current Medications: Active Medications[1]    ROS:   Please see the history of present illness.    All other systems reviewed and are negative.  EKGs       Risk Assessment/Calculations:   {Does this patient have ATRIAL FIBRILLATION?:[phone]}   {This patient may be at risk for Amyloid. He has one or more dx on the prob list or PMH from the following list -  Abnormal EKG, HFpEF/Diastolic CHF, Aortic Stenosis, LVH, Bilateral Carpal Tunnel Syndrome, Biceps Tendon Rupture, Spinal Stenosis, Pericardial Effusion, Left Atrial Enlargement, Conduction System Disorder. See list below or review PMH.  Diagnoses From Problem List           Noted     Aortic stenosis, severe 06/06/2024     Chronic heart failure with preserved ejection fraction (HFpEF) (HCC) 06/06/2024    Click HERE to open Cardiac Amyloid  Screening SmartSet to order screening OR Click HERE to defer testing for 1 year or permanently :1}    Physical Exam:    VS:  There were no vitals taken for this visit.    Wt Readings from Last 3 Encounters:  06/17/24 180 lb (81.6 kg)  06/14/24 147 lb 0.8 oz (66.7 kg)  06/08/24 142 lb (64.4 kg)     GEN: Well nourished, well developed in no acute distress NECK: No JVD CARDIAC: ***RRR, no murmurs, rubs, gallops RESPIRATORY:  Clear to auscultation without rales, wheezing or rhonchi  ABDOMEN: Soft, non-tender, non-distended EXTREMITIES:  No edema; No deformity.  Groin sites clear without hematoma or ecchymosis. ****  ASSESSMENT:    1. S/P TAVR (transcatheter aortic valve replacement)   2. Chronic heart failure with preserved ejection fraction (HFpEF)  (HCC)   3. CKD stage 3b, GFR 30-44 ml/min (HCC)   4. Urinary retention due to benign prostatic hyperplasia   5. Thoracic aortic aneurysm without rupture, unspecified part   6. Primary hypertension   7. Aortic stenosis, severe   8. Mitral valve disease     PLAN:    In order of problems listed above:  Severe AS s/p TAVR:  -- Echo today shows EF ***, normally functioning TAVR with a mean gradient of *** mm hg and *** PVL.  -- NYHA class *** symptoms.  -- Continue Asprin 81mg  daily.   -- SBE*** -- I will see back for 1 month/year office visit with echo.  Chronic diastolic CHF:   -- Appears ***.  -- He appears euvolemic. Hold further diuretics given AKI. -- BMET.    CKD stage IIIb: -- Creat baseline  ~2.  Urinary retention with foley in place: -- Continue follow up with urology.   Thoracic aortic aneurysm: -- Known TAA with most recent CTA with 4.6 cm.  -- Plan to continue surveillance.    HTN: -- BP noted to be elevated but pt reports a long history of white coat HTN.  -- Spoke to daughter, [name], who will remind him to check BP at home and will bring in log to appt next week.    CAD: -- Cath on 05/03/24 which showed moderate disease in the mid LAD and Diagonal branch, mild to moderate diffuse non-obstructive disease in large dominant RCA and no obstructive disease in the Circumflex.  -- Medical therapy was recommended.  -- Statin intolerant.   Mitral valve disease:   -- Known severe MAC with moderate MR and mild MS. -- Continue to follow.     Medication Adjustments/Labs and Tests Ordered: Current medicines are reviewed at length with the patient today.  Concerns regarding medicines are outlined above.  No orders of the defined types were placed in this encounter.  No orders of the defined types were placed in this encounter.   There are no Patient Instructions on file for this visit.   Signed, Lamarr Hummer, PA-C  07/06/2024 9:00 AM    [facility]    [1]  No outpatient medications have been marked as taking for the 07/06/24 encounter (Appointment) with Hummer Lamarr SAUNDERS, PA-C.

## 2024-07-07 ENCOUNTER — Other Ambulatory Visit: Payer: Self-pay

## 2024-07-07 DIAGNOSIS — Z952 Presence of prosthetic heart valve: Secondary | ICD-10-CM

## 2024-07-09 ENCOUNTER — Other Ambulatory Visit: Payer: Self-pay

## 2024-07-09 ENCOUNTER — Encounter (HOSPITAL_COMMUNITY): Payer: Self-pay | Admitting: Emergency Medicine

## 2024-07-09 ENCOUNTER — Emergency Department (HOSPITAL_COMMUNITY)
Admission: EM | Admit: 2024-07-09 | Discharge: 2024-07-10 | Disposition: A | Attending: Emergency Medicine | Admitting: Emergency Medicine

## 2024-07-09 DIAGNOSIS — T83021A Displacement of indwelling urethral catheter, initial encounter: Secondary | ICD-10-CM

## 2024-07-09 DIAGNOSIS — Y732 Prosthetic and other implants, materials and accessory gastroenterology and urology devices associated with adverse incidents: Secondary | ICD-10-CM | POA: Diagnosis not present

## 2024-07-09 DIAGNOSIS — Z7982 Long term (current) use of aspirin: Secondary | ICD-10-CM | POA: Diagnosis not present

## 2024-07-09 DIAGNOSIS — T83028A Displacement of other indwelling urethral catheter, initial encounter: Secondary | ICD-10-CM | POA: Diagnosis present

## 2024-07-09 NOTE — ED Triage Notes (Addendum)
 Pt bib ems from [facility] with c/o bleeding after pulling out his foley catheter after falling. No loc, did not hit his head, not on thinners. Currently still having some bleeding.   116/72 99ra 128cbg 103hr 20rr

## 2024-07-10 LAB — CBC WITH DIFFERENTIAL/PLATELET
Abs Immature Granulocytes: 0.07 K/uL (ref 0.00–0.07)
Basophils Absolute: 0.1 K/uL (ref 0.0–0.1)
Basophils Relative: 1 %
Eosinophils Absolute: 0.2 K/uL (ref 0.0–0.5)
Eosinophils Relative: 2 %
HCT: 26 % — ABNORMAL LOW (ref 39.0–52.0)
Hemoglobin: 8 g/dL — ABNORMAL LOW (ref 13.0–17.0)
Immature Granulocytes: 1 %
Lymphocytes Relative: 8 %
Lymphs Abs: 0.8 K/uL (ref 0.7–4.0)
MCH: 30.9 pg (ref 26.0–34.0)
MCHC: 30.8 g/dL (ref 30.0–36.0)
MCV: 100.4 fL — ABNORMAL HIGH (ref 80.0–100.0)
Monocytes Absolute: 1.5 K/uL — ABNORMAL HIGH (ref 0.1–1.0)
Monocytes Relative: 15 %
Neutro Abs: 7.4 K/uL (ref 1.7–7.7)
Neutrophils Relative %: 73 %
Platelets: 145 K/uL — ABNORMAL LOW (ref 150–400)
RBC: 2.59 MIL/uL — ABNORMAL LOW (ref 4.22–5.81)
RDW: 17.3 % — ABNORMAL HIGH (ref 11.5–15.5)
WBC: 10 K/uL (ref 4.0–10.5)
nRBC: 0 % (ref 0.0–0.2)

## 2024-07-10 LAB — TYPE AND SCREEN
ABO/RH(D): A POS
Antibody Screen: NEGATIVE

## 2024-07-10 LAB — BASIC METABOLIC PANEL WITH GFR
Anion gap: 8 (ref 5–15)
BUN: 28 mg/dL — ABNORMAL HIGH (ref 8–23)
CO2: 28 mmol/L (ref 22–32)
Calcium: 8.4 mg/dL — ABNORMAL LOW (ref 8.9–10.3)
Chloride: 105 mmol/L (ref 98–111)
Creatinine, Ser: 1.28 mg/dL — ABNORMAL HIGH (ref 0.61–1.24)
GFR, Estimated: 56 mL/min — ABNORMAL LOW (ref >60)
Glucose, Bld: 121 mg/dL — ABNORMAL HIGH (ref 70–99)
Potassium: 4.3 mmol/L (ref 3.5–5.1)
Sodium: 141 mmol/L (ref 135–145)

## 2024-07-10 LAB — URINALYSIS, W/ REFLEX TO CULTURE (INFECTION SUSPECTED)
Bilirubin Urine: NEGATIVE
Glucose, UA: NEGATIVE mg/dL
Ketones, ur: NEGATIVE mg/dL
Nitrite: NEGATIVE
Protein, ur: 30 mg/dL — AB
RBC / HPF: >50 RBC/hpf (ref 0–5)
Specific Gravity, Urine: 1.011 (ref 1.005–1.030)
pH: 8 (ref 5.0–8.0)

## 2024-07-10 NOTE — ED Provider Notes (Signed)
 [place] EMERGENCY DEPARTMENT AT [facility] Provider Note   CSN: [id number] Arrival date & time: 07/09/24  2346     Patient presents with: foley catheter problem   Drew Adams is a 83 y.o. male.   Presents to the emergency department for evaluation after Foley catheter was pulled out.  Patient reports that he has had a catheter in place for a couple of weeks.  He is not sure how but the catheter was inadvertently pulled out.  He comes to the emergency department from nursing home because he is noted to have bleeding from his penis after dislodgment.  He denies any pain.       Prior to Admission medications  Medication Sig Start Date End Date Taking? Authorizing Provider  acetaminophen  (TYLENOL ) 325 MG tablet Take 2 tablets (650 mg total) by mouth every 6 (six) hours as needed for mild pain (pain score 1-3) or fever (or Fever >/= 101). 06/26/24   Sherrill Cable Latif, DO  albuterol  (PROVENTIL ) (2.5 MG/3ML) 0.083% nebulizer solution Take 3 mLs (2.5 mg total) by nebulization every 6 (six) hours as needed for wheezing or shortness of breath. 06/26/24   Sherrill Cable Latif, DO  allopurinol  (ZYLOPRIM ) 300 MG tablet Take 300 mg by mouth daily. 11/15/12   [provider]  Ascorbic Acid  (VITAMIN C ) 1000 MG tablet Take 1,000 mg by mouth daily.    [provider]  aspirin  81 MG chewable tablet Chew 1 tablet (81 mg total) by mouth daily. 06/01/24   Sebastian Lamarr SAUNDERS, PA-C  calcium  citrate (CALCITRATE - DOSED IN MG ELEMENTAL CALCIUM ) 950 MG tablet Take 600 mg by mouth daily.    [provider]  cyanocobalamin  (VITAMIN B12) 1000 MCG tablet Take 1,000 mcg by mouth daily.    [provider]  finasteride  (PROSCAR ) 5 MG tablet Take 5 mg by mouth daily.    [provider]  hydrocortisone cream 1 % Apply 1 Application topically daily.    [provider]  MAGNESIUM  GLYCINATE PO Take 400 mg by mouth daily.    [provider]   meclizine  (ANTIVERT ) 25 MG tablet Take 1 tablet (25 mg total) by mouth 3 (three) times daily as needed for dizziness. 02/23/24   Mannie Fairy DASEN, DO  Multiple Vitamin (MULTIVITAMIN WITH MINERALS) TABS Take 1 tablet by mouth daily. Mature    [provider]  ondansetron  (ZOFRAN ) 4 MG tablet Take 1 tablet (4 mg total) by mouth every 6 (six) hours as needed for nausea. 06/26/24   Sherrill Cable Latif, DO  oxymetazoline  (AFRIN) 0.05 % nasal spray Place 1 spray into both nostrils 2 (two) times daily. 06/15/24   Singh, Prashant K, MD  pantoprazole  (PROTONIX ) 40 MG tablet Take 1 tablet (40 mg total) by mouth daily. 06/15/24   Singh, Prashant K, MD  predniSONE  (DELTASONE ) 5 MG tablet Take 5 mg by mouth daily as needed (Gout). 01/07/18   [provider]  psyllium (METAMUCIL) 58.6 % packet Take 1 packet by mouth daily. 10 ml    [provider]  PURE L-ARGININE HCL PO Take 1,800 mg by mouth daily.    [provider]  sodium chloride  (OCEAN) 0.65 % SOLN nasal spray Place 1 spray into both nostrils 4 (four) times daily. 06/15/24   Dennise Lavada POUR, MD  tamsulosin  (FLOMAX ) 0.4 MG CAPS capsule Take 2 capsules (0.8 mg total) by mouth daily. 06/26/24   Sherrill Cable Latif, DO  Zinc  Gluconate 15 MG TABS Take 50  mg by mouth daily. Plus copper 2 mg    [provider]    Allergies: Crestor  [rosuvastatin ], Exforge [amlodipine besylate-valsartan], and Simvastatin    Review of Systems  Updated Vital Signs BP 110/64   Pulse 100   Temp 97.6 F (36.4 C) (Oral)   Resp 17   Ht 5' 10 (1.778 m)   Wt 81.6 kg   SpO2 100%   BMI 25.81 kg/m   Physical Exam Vitals and nursing note reviewed. Exam conducted with a chaperone present.  Constitutional:      General: He is not in acute distress.    Appearance: He is well-developed.  HENT:     Head: Normocephalic and atraumatic.     Mouth/Throat:     Mouth: Mucous membranes are moist.  Eyes:     General: Vision grossly  intact. Gaze aligned appropriately.     Extraocular Movements: Extraocular movements intact.     Conjunctiva/sclera: Conjunctivae normal.  Cardiovascular:     Rate and Rhythm: Normal rate and regular rhythm.     Pulses: Normal pulses.     Heart sounds: Normal heart sounds, S1 normal and S2 normal. No murmur heard.    No friction rub. No gallop.  Pulmonary:     Effort: Pulmonary effort is normal. No respiratory distress.     Breath sounds: Normal breath sounds.  Abdominal:     Palpations: Abdomen is soft.     Tenderness: There is no abdominal tenderness. There is no guarding or rebound.     Hernia: No hernia is present.  Genitourinary:    Penis: Uncircumcised. No tenderness or swelling.      Comments: Blood noted at meatus, no active bleeding Musculoskeletal:        General: No swelling.     Cervical back: Full passive range of motion without pain, normal range of motion and neck supple. No pain with movement, spinous process tenderness or muscular tenderness. Normal range of motion.     Right lower leg: No edema.     Left lower leg: No edema.  Skin:    General: Skin is warm and dry.     Capillary Refill: Capillary refill takes less than 2 seconds.     Findings: No ecchymosis, erythema, lesion or wound.  Neurological:     Mental Status: He is alert and oriented to person, place, and time.     GCS: GCS eye subscore is 4. GCS verbal subscore is 5. GCS motor subscore is 6.     Cranial Nerves: Cranial nerves 2-12 are intact.     Sensory: Sensation is intact.     Motor: Motor function is intact. No weakness or abnormal muscle tone.     Coordination: Coordination is intact.  Psychiatric:        Mood and Affect: Mood normal.        Speech: Speech normal.        Behavior: Behavior normal.     (all labs ordered are listed, but only abnormal results are displayed) Labs Reviewed  CBC WITH DIFFERENTIAL/PLATELET - Abnormal; Notable for the following components:      Result Value   RBC  2.59 (*)    Hemoglobin 8.0 (*)    HCT 26.0 (*)    MCV 100.4 (*)    RDW 17.3 (*)    Platelets 145 (*)    Monocytes Absolute 1.5 (*)    All other components within normal limits  BASIC METABOLIC PANEL WITH GFR - Abnormal; Notable  for the following components:   Glucose, Bld 121 (*)    BUN 28 (*)    Creatinine, Ser 1.28 (*)    Calcium  8.4 (*)    GFR, Estimated 56 (*)    All other components within normal limits  URINALYSIS, W/ REFLEX TO CULTURE (INFECTION SUSPECTED) - Abnormal; Notable for the following components:   Color, Urine AMBER (*)    APPearance CLOUDY (*)    Hgb urine dipstick LARGE (*)    Protein, ur 30 (*)    Leukocytes,Ua SMALL (*)    Bacteria, UA FEW (*)    All other components within normal limits  TYPE AND SCREEN    EKG: None  Radiology: No results found.   Procedures   Medications Ordered in the ED - No data to display                                  Medical Decision Making Amount and/or Complexity of Data Reviewed External Data Reviewed: labs, radiology and notes. Labs: ordered. Decision-making details documented in ED Course.   Presents for evaluation after his Foley catheter was traumatically removed.  I did review his records, catheter was placed recently after he was admitted for gross hematuria and urinary retention.  During that hospital stay patient required continuous bladder irrigation and eventually got a blood transfusion secondary to anemia from blood loss.  Patient did have some blood in his brief and on his meatus at arrival but no active bleeding.  This seems to be secondary to trauma but with this past history, lab work was performed.  Hemoglobin is at baseline.  Vital signs are unremarkable.  Urinalysis shows gross blood but no signs of infection.  Foley can that are replaced, patient will be discharged back to nursing home.     Final diagnoses:  Displacement of Foley catheter, initial encounter    ED Discharge Orders     None           Rodriquez Thorner, Lonni PARAS, MD 07/10/24 [phone]

## 2024-07-13 ENCOUNTER — Encounter (HOSPITAL_COMMUNITY): Payer: Self-pay | Admitting: Surgery

## 2024-08-10 ENCOUNTER — Encounter (HOSPITAL_BASED_OUTPATIENT_CLINIC_OR_DEPARTMENT_OTHER): Payer: Self-pay

## 2024-08-11 ENCOUNTER — Other Ambulatory Visit (HOSPITAL_BASED_OUTPATIENT_CLINIC_OR_DEPARTMENT_OTHER)

## 2024-08-25 ENCOUNTER — Telehealth: Payer: Self-pay

## 2024-08-25 NOTE — Telephone Encounter (Signed)
 This patient did not come to his scheduled 1 month s/p TAVR follow up echocardiogram or office visit with Izetta Hummer on 07/06/2024. We attempted to call the the patient and his nursing home, leaving messages, several times over the past month but we have no been able to get in touch with them to reschedule his appointment. He is now no longer within the 1 month follow up window of 23-75 days.

## 2025-05-17 ENCOUNTER — Ambulatory Visit (HOSPITAL_COMMUNITY)

## 2025-05-17 ENCOUNTER — Ambulatory Visit: Admitting: Physician Assistant
